# Patient Record
Sex: Male | Born: 1955 | Race: White | Hispanic: No | Marital: Married | Smoking: Former smoker
Health system: Southern US, Community
[De-identification: ages and names within clinical notes are randomized; demographics above are authoritative.]

## PROBLEM LIST (undated history)

## (undated) DIAGNOSIS — Z9889 Other specified postprocedural states: Secondary | ICD-10-CM

## (undated) DIAGNOSIS — R112 Nausea with vomiting, unspecified: Secondary | ICD-10-CM

## (undated) DIAGNOSIS — F17200 Nicotine dependence, unspecified, uncomplicated: Secondary | ICD-10-CM

## (undated) DIAGNOSIS — K219 Gastro-esophageal reflux disease without esophagitis: Secondary | ICD-10-CM

## (undated) DIAGNOSIS — K222 Esophageal obstruction: Secondary | ICD-10-CM

## (undated) DIAGNOSIS — T8859XA Other complications of anesthesia, initial encounter: Secondary | ICD-10-CM

## (undated) DIAGNOSIS — K279 Peptic ulcer, site unspecified, unspecified as acute or chronic, without hemorrhage or perforation: Secondary | ICD-10-CM

## (undated) DIAGNOSIS — I4891 Unspecified atrial fibrillation: Secondary | ICD-10-CM

## (undated) DIAGNOSIS — M2170 Unequal limb length (acquired), unspecified site: Secondary | ICD-10-CM

## (undated) DIAGNOSIS — E785 Hyperlipidemia, unspecified: Secondary | ICD-10-CM

## (undated) DIAGNOSIS — J342 Deviated nasal septum: Secondary | ICD-10-CM

## (undated) HISTORY — DX: Deviated nasal septum: J34.2

## (undated) HISTORY — PX: KNEE ARTHROSCOPY: SUR90

## (undated) HISTORY — PX: POLYPECTOMY: SHX149

## (undated) HISTORY — DX: Esophageal obstruction: K22.2

## (undated) HISTORY — DX: Gastro-esophageal reflux disease without esophagitis: K21.9

## (undated) HISTORY — DX: Peptic ulcer, site unspecified, unspecified as acute or chronic, without hemorrhage or perforation: K27.9

## (undated) HISTORY — DX: Unequal limb length (acquired), unspecified site: M21.70

## (undated) HISTORY — DX: Hyperlipidemia, unspecified: E78.5

## (undated) HISTORY — DX: Unspecified atrial fibrillation: I48.91

## (undated) HISTORY — PX: EYE SURGERY: SHX253

## (undated) HISTORY — DX: Nicotine dependence, unspecified, uncomplicated: F17.200

---

## 2000-08-01 ENCOUNTER — Encounter: Admission: RE | Admit: 2000-08-01 | Discharge: 2000-08-01 | Payer: Self-pay | Admitting: Family Medicine

## 2000-08-01 ENCOUNTER — Encounter: Payer: Self-pay | Admitting: Family Medicine

## 2000-12-11 ENCOUNTER — Ambulatory Visit (HOSPITAL_COMMUNITY): Admission: RE | Admit: 2000-12-11 | Discharge: 2000-12-11 | Payer: Self-pay | Admitting: Gastroenterology

## 2000-12-11 ENCOUNTER — Encounter: Payer: Self-pay | Admitting: Gastroenterology

## 2000-12-31 ENCOUNTER — Ambulatory Visit (HOSPITAL_COMMUNITY): Admission: RE | Admit: 2000-12-31 | Discharge: 2000-12-31 | Payer: Self-pay | Admitting: Gastroenterology

## 2000-12-31 ENCOUNTER — Encounter: Payer: Self-pay | Admitting: Gastroenterology

## 2001-02-25 ENCOUNTER — Ambulatory Visit (HOSPITAL_COMMUNITY): Admission: RE | Admit: 2001-02-25 | Discharge: 2001-02-25 | Payer: Self-pay | Admitting: Gastroenterology

## 2005-11-04 HISTORY — PX: COLONOSCOPY: SHX174

## 2006-06-05 ENCOUNTER — Ambulatory Visit: Payer: Self-pay | Admitting: Family Medicine

## 2006-07-01 ENCOUNTER — Ambulatory Visit: Payer: Self-pay | Admitting: Internal Medicine

## 2006-07-17 ENCOUNTER — Ambulatory Visit: Payer: Self-pay | Admitting: Family Medicine

## 2006-07-29 ENCOUNTER — Ambulatory Visit: Payer: Self-pay | Admitting: Internal Medicine

## 2006-07-29 LAB — HM COLONOSCOPY: HM Colonoscopy: NONE SEEN

## 2008-01-21 ENCOUNTER — Ambulatory Visit: Payer: Self-pay | Admitting: Family Medicine

## 2008-03-14 ENCOUNTER — Ambulatory Visit: Payer: Self-pay | Admitting: Family Medicine

## 2009-05-25 ENCOUNTER — Ambulatory Visit: Payer: Self-pay | Admitting: Family Medicine

## 2009-11-08 ENCOUNTER — Ambulatory Visit: Payer: Self-pay | Admitting: Family Medicine

## 2010-09-10 ENCOUNTER — Ambulatory Visit: Payer: Self-pay | Admitting: Family Medicine

## 2010-10-03 ENCOUNTER — Ambulatory Visit: Payer: Self-pay | Admitting: Family Medicine

## 2010-10-30 ENCOUNTER — Ambulatory Visit
Admission: RE | Admit: 2010-10-30 | Discharge: 2010-10-30 | Payer: Self-pay | Source: Home / Self Care | Attending: Internal Medicine | Admitting: Internal Medicine

## 2010-10-30 ENCOUNTER — Telehealth: Payer: Self-pay | Admitting: Internal Medicine

## 2010-10-30 DIAGNOSIS — K2100 Gastro-esophageal reflux disease with esophagitis, without bleeding: Secondary | ICD-10-CM | POA: Insufficient documentation

## 2010-10-30 DIAGNOSIS — K219 Gastro-esophageal reflux disease without esophagitis: Secondary | ICD-10-CM | POA: Insufficient documentation

## 2010-10-30 DIAGNOSIS — R1033 Periumbilical pain: Secondary | ICD-10-CM | POA: Insufficient documentation

## 2010-10-30 DIAGNOSIS — R112 Nausea with vomiting, unspecified: Secondary | ICD-10-CM | POA: Insufficient documentation

## 2010-10-31 ENCOUNTER — Encounter: Payer: Self-pay | Admitting: Internal Medicine

## 2010-10-31 ENCOUNTER — Ambulatory Visit: Payer: Self-pay | Admitting: Cardiology

## 2010-11-06 LAB — CONVERTED CEMR LAB
ALT: 20 units/L (ref 0–53)
AST: 16 units/L (ref 0–37)
Albumin: 3.9 g/dL (ref 3.5–5.2)
Alkaline Phosphatase: 78 units/L (ref 39–117)
BUN: 19 mg/dL (ref 6–23)
Basophils Absolute: 0.1 10*3/uL (ref 0.0–0.1)
Basophils Relative: 0.8 % (ref 0.0–3.0)
CO2: 29 meq/L (ref 19–32)
CRP, High Sensitivity: 7.38 — ABNORMAL HIGH (ref 0.00–5.00)
Calcium: 9.4 mg/dL (ref 8.4–10.5)
Chloride: 105 meq/L (ref 96–112)
Creatinine, Ser: 1.1 mg/dL (ref 0.4–1.5)
Eosinophils Absolute: 0.2 10*3/uL (ref 0.0–0.7)
Eosinophils Relative: 1.7 % (ref 0.0–5.0)
GFR calc non Af Amer: 75.61 mL/min (ref >60.00)
Glucose, Bld: 78 mg/dL (ref 70–99)
HCT: 40.3 % (ref 39.0–52.0)
Hemoglobin: 14.1 g/dL (ref 13.0–17.0)
Lipase: 30 units/L (ref 11.0–59.0)
Lymphocytes Relative: 29.9 % (ref 12.0–46.0)
Lymphs Abs: 2.8 10*3/uL (ref 0.7–4.0)
MCHC: 34.8 g/dL (ref 30.0–36.0)
MCV: 95.8 fL (ref 78.0–100.0)
Monocytes Absolute: 0.6 10*3/uL (ref 0.1–1.0)
Monocytes Relative: 5.8 % (ref 3.0–12.0)
Neutro Abs: 5.8 10*3/uL (ref 1.4–7.7)
Neutrophils Relative %: 61.8 % (ref 43.0–77.0)
Platelets: 325 10*3/uL (ref 150.0–400.0)
Potassium: 4.1 meq/L (ref 3.5–5.1)
RBC: 4.21 M/uL — ABNORMAL LOW (ref 4.22–5.81)
RDW: 13.9 % (ref 11.5–14.6)
Sodium: 140 meq/L (ref 135–145)
Total Bilirubin: 0.3 mg/dL (ref 0.3–1.2)
Total Protein: 6.8 g/dL (ref 6.0–8.3)
WBC: 9.4 10*3/uL (ref 4.5–10.5)

## 2010-11-08 ENCOUNTER — Ambulatory Visit
Admission: RE | Admit: 2010-11-08 | Discharge: 2010-11-08 | Payer: Self-pay | Source: Home / Self Care | Attending: Internal Medicine | Admitting: Internal Medicine

## 2010-11-08 ENCOUNTER — Encounter: Payer: Self-pay | Admitting: Internal Medicine

## 2010-12-06 NOTE — Procedures (Addendum)
Summary: [facility] COLON   Colonoscopy  Procedure date:  07/29/2006  Findings:      Location:  [facility].    Procedures Next Due Date:    Colonoscopy: 08/2016 Patient Name: Gerald [name], Gerald [name] MRN:  Procedure Procedures: Colonoscopy CPT: 40981.  Personnel: Endoscopist: Wilhemina Bonito. Marina Goodell, MD.  Referred By: Everardo All Susann Givens, MD.  Exam Location: Exam performed in Outpatient Clinic. Outpatient  Patient Consent: Procedure, Alternatives, Risks and Benefits discussed, consent obtained, from patient. Consent was obtained by the RN.  Indications  Increased Risk Screening: Family History of Polyps.  History  Current Medications: Patient is not currently taking Coumadin.  Pre-Exam Physical: Performed Jul 29, 2006. Cardio-pulmonary exam, Rectal exam, HEENT exam , Abdominal exam, Mental status exam WNL.  Comments: Pt. history reviewed/updated, physical exam performed prior to initiation of sedation?YES Exam Exam: Extent of exam reached: Cecum, extent intended: Cecum.  The cecum was identified by appendiceal orifice and IC valve. Patient position: on left side. Time to Cecum: 00:02:50. Time for Withdrawl: 00:18:13. Colon retroflexion performed. Images taken. ASA Classification: I. Tolerance: excellent.  Monitoring: Pulse and BP monitoring, Oximetry used. Supplemental O2 given.  Colon Prep Used MIRALAX for colon prep. Prep results: good.  Sedation Meds: Patient assessed and found to be appropriate for moderate (conscious) sedation. Fentanyl 100 mcg. given IV. Versed 10 mg. given IV.  Findings NORMAL EXAM: Cecum to Rectum.   Assessment Normal examination.  Comments: NO POLYPS SEEN Events  Unplanned Interventions: No intervention was required.  Unplanned Events: There were no complications. Plans Disposition: After procedure patient sent to recovery. After recovery patient sent home.  Scheduling/Referral: Colonoscopy, to Wilhemina Bonito. Marina Goodell, MD, IN 10 YEARS FOR  RPEAT SCREENING (sooner if clinically indicated),   Comments: RETURN TO THE CARE OF DR Susann Givens  cc: Everardo All. Susann Givens, MD  This report was created from the original endoscopy report, which was reviewed and signed by the above listed endoscopist.

## 2010-12-06 NOTE — Assessment & Plan Note (Addendum)
Summary: ABDOMINAL DISCOMFORT (work in appt)   History of Present Illness Visit Type: Initial Visit Primary GI MD: Yancey Flemings MD Primary Provider: Sharlot Gowda, MD Chief Complaint: epigastric pain, nausea, vomiting x 1 month History of Present Illness:   54 YO MALE KNOWN TO DR. PERRY FROM PRIOR COLONOSCOPY IN 2007. THIS WAS A NORMAL EXAM.  HE COMES IN TODAY AS AN ADD ON WITH NEW C/O EPIGASTRIC /UPPER ABDOMINAL PAIN,NAUSEA AND VOMITING. HE SAYS HIS SXS STARTED IN EARLY DECEMBER WHILE HE WAS ON A GOLF TRIP. HE WOKE UP NAUSEATED,WITH A HEAVY FEELING IN HIS ABDOMEN AND LATER VOMITED WHILE ON THE GILF COURSE. HE HAS BEEN WAKING UP FREQUENTLY NAUSEATED SINCE THEN , AND HAS HAD INTERMITTENT UPPER ABDOMINAL PAIN WHICH HE DESCRIBES AS A" BOWLING BALL" FEELING . APPETITE OK, WEIGHT STABLE, NO CHANGE IN BOWEL HABITS, NO HEME OR MELENA. NO FEVERS. TODAY HE WOKE UP NAUSEATED ,HAD THE HEAVY PRESSURE FEELING IN HIS ABDOMEN, AND VOMITED. HE HAS SINCE BEEN ABLE TP KEEP DOWN SOME by mouth'S BUT FEELS QUEASY.  HE STAYS ON A PPI FOR GERD, CURRENTLY ON NEXIUM 40 DAILY. TAKES OCCASIONAL IBUPROFEN /ALEVE THOUGH NOT DAILY. HE RELATES HE HAD A PHYSICAL LAST MONTH WITH DR. Susann Givens. WE OBTAINED COPIES OF THOSE LABS ANS CBC,CMET UNREMARKABLE.   GI Review of Systems    Reports abdominal pain, acid reflux, bloating, nausea, and  vomiting.     Location of  Abdominal pain: epigastric area.    Denies belching, chest pain, dysphagia with liquids, dysphagia with solids, heartburn, loss of appetite, vomiting blood, weight loss, and  weight gain.      Reports light color stool.     Denies anal fissure, black tarry stools, change in bowel habit, constipation, diarrhea, diverticulosis, fecal incontinence, heme positive stool, hemorrhoids, irritable bowel syndrome, jaundice, liver problems, rectal bleeding, and  rectal pain. Preventive Screening-Counseling & Management  Alcohol-Tobacco     Smoking Status: current      Drug Use:   no.      Current Medications (verified): 1)  Nexium 40 Mg Cpdr (Esomeprazole Magnesium) .... Take 1 Capsule By Mouth Once Daily 2)  Prilosec Otc 20 Mg Tbec (Omeprazole Magnesium) .... As Needed  Allergies (verified): No Known Drug Allergies  Past History:  Past Medical History: duodenal ulcer 1980 gerd  Past Surgical History: Knee Arthroscopy 2000 colonoscopy 2007-normal  Family History: Family History of Colon Polyps:Mother No FH of Colon Cancer:  Social History: Occupation: Agricultural consultant Patient currently smokes.  Alcohol Use - yes Daily Caffeine Use Illicit Drug Use - no Smoking Status:  current Drug Use:  no  Review of Systems  The patient denies allergy/sinus, anemia, anxiety-new, arthritis/joint pain, back pain, blood in urine, breast changes/lumps, change in vision, confusion, cough, coughing up blood, depression-new, fainting, fatigue, fever, headaches-new, hearing problems, heart murmur, heart rhythm changes, itching, menstrual pain, muscle pains/cramps, night sweats, nosebleeds, pregnancy symptoms, shortness of breath, skin rash, sleeping problems, sore throat, swelling of feet/legs, swollen lymph glands, thirst - excessive , urination - excessive , urination changes/pain, urine leakage, vision changes, and voice change.         SEE HPI  Vital Signs:  Patient profile:   55 year old male Height:      72 inches Weight:      181.25 pounds BMI:     24.67 Pulse rate:   64 / minute Pulse rhythm:   regular BP sitting:   114 / 68  (left arm) Cuff size:  regular  Vitals Entered By: June McMurray CMA Duncan Dull) (October 30, 2010 2:25 PM)  Physical Exam  General:  Well developed, well nourished, no acute distress. Head:  Normocephalic and atraumatic. Eyes:  PERRLA, no icterus. Lungs:  Clear throughout to auscultation. Heart:  Regular rate and rhythm; no murmurs, rubs,  or bruits. Abdomen:  SOFT, TENDER HYPOGASTRIUM/PERIUMBILICAL REGION, NO GUARDING, NO  REBOUND, NO PALP MASS OR HSM,BS+ Rectal:  NOT DONE Extremities:  No clubbing, cyanosis, edema or deformities noted. Neurologic:  Alert and  oriented x4;  grossly normal neurologically. Psych:  Alert and cooperative. Normal mood and affect.   Impression & Recommendations:  Problem # 1:  NAUSEA WITH VOMITING (ICD-787.01) Assessment New 54 YO MALE GENERLAYY HEALTHY WITH HX GERD, NOW WITH ONE MONTH HX OF INTERMITTENT NAUSEA, VOMITING,UPPER ABDOMINAL DISCOMFORT,HEAVINESS, BLOATING. EPISODE OF N/V AND DISCOMFORT TODAY. R/O GB DISEASE,R/O PUD,PANCREATIC DISEASE.  LABS AS BELOW SCHEDULE FOR CT ABDOMEN /PELVIS TODAY OR TOMORROW TAKE NEXIUM 40 MG TWICE DAILY FOR NOW PHENERGAN 25 MG  Q 6 HOURS AS NEEDED FOR NAUSEA IF CT AND LABS UNREVEALING WILL LIKELY NEED EGD WITH DR. PERRY.  Orders: TLB-CMP (Comprehensive Metabolic Pnl) (80053-COMP) TLB-CRP-High Sensitivity (C-Reactive Protein) (86140-FCRP) TLB-CBC Platelet - w/Differential (85025-CBCD) TLB-Lipase (83690-LIPASE) CT Abdomen/Pelvis with Contrast (CT Abd/Pelvis w/con)  Problem # 2:  GERD (ICD-530.81) Assessment: Comment Only  Patient Instructions: 1)  Please go to lab, basement level. 2)  We sent a perscription for Promethazine for the nausea to [facility] [address] 3)  We scheduled the CT scan at our [place] office [address] in our [place] building across from [facility].  4)  Copy sent to : Dr. Sharlot Gowda 5)  The medication list was reviewed and reconciled.  All changed / newly prescribed medications were explained.  A complete medication list was provided to the patient / caregiver. Prescriptions: PROMETHAZINE HCL 25 MG TABS (PROMETHAZINE HCL) Take 1 tab every 6 hours as needed for nausea  #20 x 0   Entered by:   Lowry Ram NCMA   Authorized by:   Sammuel Cooper PA-c   Signed by:   Lowry Ram NCMA on 10/30/2010   Method used:   [facility]ically to        [phone]* (retail)        [address] [place], [place]  [address]       Ph: [phone] or [phone]       Fax: [phone]   RxID:   [id number]

## 2010-12-06 NOTE — Progress Notes (Addendum)
Summary: Triage  Phone Note Call from Patient Call back at [phone]   Caller: Patient Call For: Dr. Marina Goodell Reason for Call: Talk to Nurse Summary of Call: Pts wife is calling bacause pt has been having abdominal discomfort and occasional vomiting, scheduled in feb but wife wants earlier appt because she thinks husband is seriously sick Initial call taken by: [place] Johnson,  October 30, 2010 8:25 AM  Follow-up for Phone Call        Pt. has had 4 episodes of nausea, vomiting and abd. pain since the first week in Dec. with current symptoms now.Given appt. with PA for this afternoon as DrPerry is supervising dr. Follow-up by: Teryl Lucy RN,  October 30, 2010 9:25 AM

## 2010-12-06 NOTE — Procedures (Addendum)
Summary: Upper Endoscopy  Patient: [name] Note: All result statuses are Final unless otherwise noted.  Tests: (1) Upper Endoscopy (EGD)   EGD Upper Endoscopy       DONE     [facility]     [address]     [place], [place]  [address]           ENDOSCOPY PROCEDURE REPORT           PATIENT:  Gerald [name], Gerald [name]  MR#:  [id number]     BIRTHDATE:  Jul 23, 1956, 54 yrs. old  GENDER:  male           ENDOSCOPIST:  Wilhemina Bonito. Eda Keys, MD     Referred by:  Office           PROCEDURE DATE:  11/08/2010     PROCEDURE:  EGD, diagnostic 19147     ASA CLASS:  Class I     INDICATIONS:  nausea, bloating, abdominal discomfort           MEDICATIONS:   Fentanyl 100 mcg IV, Versed 10 mg IV     TOPICAL ANESTHETIC:  Exactacain Spray           DESCRIPTION OF PROCEDURE:   After the risks benefits and     alternatives of the procedure were thoroughly explained, informed     consent was obtained.  The LB GIF-H180 [id number] endoscope was     introduced through the mouth and advanced to the fourth portion of     the duodenum, without limitations.  The instrument was slowly     withdrawn as the mucosa was fully examined.     <<PROCEDUREIMAGES>>           The upper, middle, and distal third of the esophagus were     carefully inspected and no abnormalities were noted. The z-line     was well seen at the GEJ. The endoscope was pushed into the fundus     which was normal including a retroflexed view. The antrum,gastric     body, first, second, third, and fourth part of the duodenum were     unremarkable.    Retroflexed views revealed no abnormalities.     The scope was then withdrawn from the patient and the procedure     completed.           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 Eda Keys, MD           CC:  Sharlot Gowda, MD, The Patient           n.     eSIGNED:   Wilhemina Bonito. Eda Keys at 11/08/2010 03:57 PM           Rita Ohara, [phone]  Note: An exclamation mark (!) indicates a result that was not dispersed into the flowsheet. Document Creation Date: 11/08/2010 3:58 PM _______________________________________________________________________  (1) Order result status: Final Collection or observation date-time: 11/08/2010 15:49 Requested date-time:  Receipt date-time:  Reported date-time:  Referring Physician:   Ordering Physician:  Fransico Setters [phone]) Specimen Source:  Source: Launa Grill Order Number: [id number] Lab site:

## 2010-12-06 NOTE — Miscellaneous (Addendum)
Summary: [facility] EGD  Clinical Lists Changes  Orders: Added new Test order of EGD (EGD) - Signed

## 2010-12-06 NOTE — Letter (Addendum)
Summary: EGD Instructions  [facility]  [address] [place], [place] [address]   Phone: [phone]  Fax: [phone]       Gerald [name]    03-Nov-1956    MRN: [id number]       Procedure Day /Date: 11-08-2010     Arrival Time: 3;00 PM      Procedure Time: 4:00 PM     Location of Procedure:                    X    [facility] (4th Floor) PREPARATION FOR ENDOSCOPY   On 11-08-2010 THE DAY OF THE PROCEDURE:  1.   No solid foods, milk or milk products are allowed after midnight the night before your procedure.  2.   Do not drink anything colored red or purple.  Avoid juices with pulp.  No orange juice.  3.  You may drink clear liquids unti l2:00 PM , which is 2 hours before your procedure.                                                                                                CLEAR LIQUIDS INCLUDE: Water Jello Ice Popsicles Tea (sugar ok, no milk/cream) Powdered fruit flavored drinks Coffee (sugar ok, no milk/cream) Gatorade Juice: apple, white grape, white cranberry  Lemonade Clear bullion, consomm, broth Carbonated beverages (any kind) Strained chicken noodle soup Hard Candy    MEDICATION INSTRUCTIONS  Unless otherwise instructed, you should take regular prescription medications with a small sip of water as early as possible the morning of your procedure.         OTHER INSTRUCTIONS  You will need a responsible adult at least 55 years of age to accompany you and drive you home.   This person must remain in the waiting room during your procedure.  Wear loose fitting clothing that is easily removed.  Leave jewelry and other valuables at home.  However, you may wish to bring a book to read or an iPod/MP3 player to listen to music as you wait for your procedure to start.  Remove all body piercing jewelry and leave at home.  Total time from sign-in until discharge is approximately 2-3 hours.  You should go home directly after  your procedure and rest.  You can resume normal activities the day after your procedure.  The day of your procedure you should not:   Drive   Make legal decisions   Operate machinery   Drink alcohol   Return to work  You will receive specific instructions about eating, activities and medications before you leave.    The above instructions have been reviewed and explained to me by   _______________________    I fully understand and can verbalize these instructions _____________________________ Date _________

## 2010-12-13 ENCOUNTER — Ambulatory Visit: Payer: Self-pay | Admitting: Internal Medicine

## 2011-01-15 ENCOUNTER — Encounter (INDEPENDENT_AMBULATORY_CARE_PROVIDER_SITE_OTHER): Payer: Self-pay | Admitting: *Deleted

## 2011-01-22 NOTE — Letter (Signed)
Summary: New Patient letter  [facility]  [address] [place], [place] [address]   Phone: [phone]  Fax: [phone]       01/15/2011 MRN: [id number]  Gerald [name] [address] [place], [place]  [address]  Dear Gerald [name],  Welcome to the Gastroenterology Division at [facility].    You are scheduled to see Dr.  Marina Goodell on 02-25-11 at 11:00A.M. on the 3rd floor at [facility], [address]  We ask that you try to arrive at our office 15 minutes prior to your appointment time to allow for check-in.  We would like you to complete the enclosed self-administered evaluation form prior to your visit and bring it with you on the day of your appointment.  We will review it with you.  Also, please bring a complete list of all your medications or, if you prefer, bring the medication bottles and we will list them.  Please bring your insurance card so that we may make a copy of it.  If your insurance requires a referral to see a specialist, please bring your referral form from your primary care physician.  Co-payments are due at the time of your visit and may be paid by cash, check or credit card.     Your office visit will consist of a consult with your physician (includes a physical exam), any laboratory testing he/she may order, scheduling of any necessary diagnostic testing (e.g. x-ray, ultrasound, CT-scan), and scheduling of a procedure (e.g. Endoscopy, Colonoscopy) if required.  Please allow enough time on your schedule to allow for any/all of these possibilities.    If you cannot keep your appointment, please call [phone] to cancel or reschedule prior to your appointment date.  This allows [place] the opportunity to schedule an appointment for another patient in need of care.  If you do not cancel or reschedule by 5 p.m. the business day prior to your appointment date, you will be charged a $50.00 late cancellation/no-show fee.    Thank you for choosing  [facility] for your medical needs.  We appreciate the opportunity to care for you.  Please visit [place] at our website  to learn more about our practice.                     Sincerely,                                                             The Gastroenterology Division

## 2011-02-25 ENCOUNTER — Ambulatory Visit (INDEPENDENT_AMBULATORY_CARE_PROVIDER_SITE_OTHER): Payer: BC Managed Care – PPO | Admitting: Internal Medicine

## 2011-02-25 ENCOUNTER — Encounter: Payer: Self-pay | Admitting: Internal Medicine

## 2011-02-25 VITALS — BP 134/82 | HR 76 | Ht 72.0 in | Wt 183.0 lb

## 2011-02-25 DIAGNOSIS — R143 Flatulence: Secondary | ICD-10-CM

## 2011-02-25 DIAGNOSIS — K5289 Other specified noninfective gastroenteritis and colitis: Secondary | ICD-10-CM

## 2011-02-25 DIAGNOSIS — R141 Gas pain: Secondary | ICD-10-CM

## 2011-02-25 DIAGNOSIS — K219 Gastro-esophageal reflux disease without esophagitis: Secondary | ICD-10-CM

## 2011-02-25 MED ORDER — ESOMEPRAZOLE MAGNESIUM 40 MG PO CPDR: 40.0000 mg | DELAYED_RELEASE_CAPSULE | Freq: Every day | ORAL | Status: DC

## 2011-02-25 MED ORDER — ALIGN 4 MG PO CAPS: 1.0000 | ORAL_CAPSULE | Freq: Every day | ORAL | Status: DC

## 2011-02-25 NOTE — Progress Notes (Signed)
HISTORY OF PRESENT ILLNESS:  Gerald [name] is a 55 y.o. male with GERD and negative screening colonoscopy who presents today for followup. He is accompanied by his wife. He was evaluated in December for problems with abdominal discomfort, chronic nausea, and intermittent vomiting. Blood work was unremarkable. CT scan of the abdomen and pelvis revealed subtle mild proximal jejunal dilation. Upper endoscopy in January was unremarkable. He was empirically placed on PPI. 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 Overall he has done well. Nausea and vomiting have resolved. He describes some intermittent mid abdominal burning discomfort that lasts about 30 minutes. Also increased intestinal gas as manifested by flatus. Weight has been stable. He thinks maybe he does a bit better on Nexium though is uncertain as he takes this somewhat sporadically.  REVIEW OF SYSTEMS:  All non-GI ROS negative.  Past Medical History  Diagnosis Date  . Duodenal ulcer   . GERD (gastroesophageal reflux disease)     Past Surgical History  Procedure Date  . Knee arthroscopy     Social History Gerald [name]  reports that he has been smoking.  He does not have any smokeless tobacco history on file. He reports that he drinks alcohol. He reports that he does not use illicit drugs.  family history includes Colon polyps in his mother.  There is no history of Colon cancer.  No Known Allergies     PHYSICAL EXAMINATION:  Vital signs: BP 134/82  Pulse 76  Ht 6' (1.829 m)  Wt 183 lb (83.008 kg)  BMI 24.82 kg/m2 General: Well-developed, well-nourished, no acute distress HEENT: Sclerae are anicteric, conjunctiva pink. Oral mucosa intact Lungs: Clear Heart: Regular Abdomen: soft, nontender, nondistended, no obvious ascites, no peritoneal signs, normal bowel sounds. No organomegaly. Extremities: No edema Psychiatric: alert and oriented x3. Cooperative   ASSESSMENT:  #1. Postinfectious dysmotility.  Improving #2. Possible GERD #3. Negative screening colonoscopy 2007 #4. Increased intestinal gas   PLAN:  #1. Continue PPI more regularly to see if this helps with burning discomfort #2. Probiotic align one daily for 2-3 weeks. Samples given. This is an effort to improve problems with gas #3. GI followup when necessary

## 2011-02-25 NOTE — Patient Instructions (Signed)
Rx. For Nexium 40 mg sent to your pharmacy for you to pick up.  1 year supply. Align samples given for you to take 1 daily x 3 weeks. Please call and follow-up with Dr. Marina Goodell as needed.

## 2011-03-22 NOTE — Op Note (Signed)
[place]  Patient:    Gerald [name], Gerald [name]                   MRN: [id number] Proc. Date: 02/25/01 Attending:  Anselmo Rod, M.D. CC:         Ronnald Nian, M.D.   Operative Report  DATE OF BIRTH:  1956-06-14.  REFERRING PHYSICIAN:  Ronnald Nian, M.D.  PROCEDURE PERFORMED:  Esophagogastroduodenoscopy.  ENDOSCOPIST:  Anselmo Rod, M.D.  INSTRUMENT USED:  Olympus video panendoscope.  INDICATIONS FOR PROCEDURE:  Epigastric pain with severe reflux in a 55 year old white male rule out peptic ulcer disease, esophagitis, gastritis, etc.  PREPROCEDURE PREPARATION:  Informed consent was procured from the patient. The patient was fasted for eight hours prior to the procedure.  PREPROCEDURE PHYSICAL:  The patient had stable vital signs.  Neck supple. Chest clear to auscultation.  S1, S2 regular.  Abdomen soft with epigastric tenderness on palpation with no guarding, no rebound, no masses palpable.  DESCRIPTION OF PROCEDURE:  The patient was placed in left lateral decubitus and no sedation was used.  The patient opted to do the procedure without sedation.  Once the patient was adequately positioned and made comfortable on his left side, the Olympus video panendoscope was advanced through the mouthpiece, over the tongue, into the esophagus under direct vision.  The entire esophagus appeared normal without evidence of ring, stricture, masses, lesions or esophagitis or Barretts mucosa.  The scope was then advanced to the stomach.  There was diffuse gastritis of the gastric mucosa with more prominent changes in the antrum.  A small hiatal hernia was seen on high retroflexion.  No ulcers, erosions, masses or polyps were present in the duodenal bulb and the proximal small bowel up to 70 cm appeared normal.  IMPRESSION: 1. Moderate diffuse gastritis with more prominent changes in the antrum. 2. Small hiatal hernia. 3. Normal-appearing  esophagus and proximal small bowel.  RECOMMENDATION: 1. Patient has been advised to continue Prevacid 30 mg 1 p.o. an hour before    breakfast. 2. He has strongly been advised against the use of all nonsteroidals.  He    tends to use a lot of Aleve during the basketball season.  Tylenol is to    be used as needed. 3. The patient has been advised to increase the fluid and fiber in the diet. 4. Outpatient follow-up in the next two weeks. DD:  02/25/01 TD:  02/25/01 Job: [id number]

## 2011-06-23 ENCOUNTER — Other Ambulatory Visit: Payer: Self-pay | Admitting: Family Medicine

## 2011-06-23 DIAGNOSIS — J019 Acute sinusitis, unspecified: Secondary | ICD-10-CM

## 2011-06-23 MED ORDER — CLINDAMYCIN HCL 300 MG PO CAPS: 300.0000 mg | ORAL_CAPSULE | Freq: Three times a day (TID) | ORAL | Status: DC

## 2011-06-23 MED ORDER — DOXYCYCLINE HYCLATE 100 MG PO TABS: 100.0000 mg | ORAL_TABLET | Freq: Two times a day (BID) | ORAL | Status: DC

## 2011-06-23 NOTE — Progress Notes (Signed)
He has had difficulty since February with left-sided nasal symptoms. He is seen by ENT on 2 occasions and given an antibiotic twice. He was also given Bactroban nasal ointment. He has been off antibiotics for at least 2 months. 2 days ago he developed left-sided sinus pressure, pain, lymph node swelling, slight sore throat and malaise

## 2011-06-24 ENCOUNTER — Ambulatory Visit (INDEPENDENT_AMBULATORY_CARE_PROVIDER_SITE_OTHER): Payer: BC Managed Care – PPO | Admitting: Family Medicine

## 2011-06-24 ENCOUNTER — Encounter: Payer: Self-pay | Admitting: Family Medicine

## 2011-06-24 DIAGNOSIS — J340 Abscess, furuncle and carbuncle of nose: Secondary | ICD-10-CM

## 2011-06-24 DIAGNOSIS — J3489 Other specified disorders of nose and nasal sinuses: Secondary | ICD-10-CM

## 2011-06-24 DIAGNOSIS — J019 Acute sinusitis, unspecified: Secondary | ICD-10-CM

## 2011-06-24 NOTE — Progress Notes (Signed)
  Subjective:    Patient ID: Gerald [name], male    DOB: Nov 15, 1955, 55 y.o.   MRN: [id number]  HPI He was seen yesterday for evaluation of a history of having a left-sided nasal lesions and drainage. He was seen by ENT and treated on 2 occasions with antibiotics as well as given Bactroban. In the last couple of days he has noted increase left facial and maxillary sinus pain, pressure, sore throat, malaise, swollen lymph node.   Review of Systems     Objective:   Physical Exam alert and in no distress. Tympanic membranes and canals are normal. Throat is clear. Tonsils are normal. Neck is supple without adenopathy or thyromegaly. Cardiac exam shows a regular sinus rhythm without murmurs or gallops. Lungs are clear to auscultation. Left nares does show recent bleeding and irritation. He is tender to palpation over the maxillary sinuses especially left. Submandibular adenopathy is noted on the left.        Assessment & Plan:  Sinusitis with probable nasal infection. I will place him on clindamycin as well as doxycycline. He is to followup with me in approximately 2 weeks

## 2011-07-10 ENCOUNTER — Telehealth: Payer: Self-pay | Admitting: Family Medicine

## 2011-07-15 NOTE — Telephone Encounter (Signed)
SEEN BY [employer]

## 2011-10-04 ENCOUNTER — Encounter: Payer: Self-pay | Admitting: Family Medicine

## 2011-10-07 ENCOUNTER — Encounter: Payer: Self-pay | Admitting: Family Medicine

## 2011-10-07 ENCOUNTER — Ambulatory Visit (INDEPENDENT_AMBULATORY_CARE_PROVIDER_SITE_OTHER): Payer: BC Managed Care – PPO | Admitting: Family Medicine

## 2011-10-07 VITALS — BP 110/74 | HR 75 | Ht 72.0 in | Wt 183.0 lb

## 2011-10-07 DIAGNOSIS — Z0000 Encounter for general adult medical examination without abnormal findings: Secondary | ICD-10-CM

## 2011-10-07 DIAGNOSIS — M79609 Pain in unspecified limb: Secondary | ICD-10-CM

## 2011-10-07 DIAGNOSIS — K219 Gastro-esophageal reflux disease without esophagitis: Secondary | ICD-10-CM

## 2011-10-07 DIAGNOSIS — M461 Sacroiliitis, not elsewhere classified: Secondary | ICD-10-CM

## 2011-10-07 DIAGNOSIS — J019 Acute sinusitis, unspecified: Secondary | ICD-10-CM

## 2011-10-07 DIAGNOSIS — M79673 Pain in unspecified foot: Secondary | ICD-10-CM

## 2011-10-07 LAB — COMPREHENSIVE METABOLIC PANEL
ALT: 21 U/L (ref 0–53)
AST: 21 U/L (ref 0–37)
Albumin: 4.8 g/dL (ref 3.5–5.2)
Alkaline Phosphatase: 90 U/L (ref 39–117)
BUN: 16 mg/dL (ref 6–23)
CO2: 28 mEq/L (ref 19–32)
Calcium: 9.4 mg/dL (ref 8.4–10.5)
Chloride: 104 mEq/L (ref 96–112)
Creat: 0.95 mg/dL (ref 0.50–1.35)
Glucose, Bld: 90 mg/dL (ref 70–99)
Potassium: 4.9 mEq/L (ref 3.5–5.3)
Sodium: 140 mEq/L (ref 135–145)
Total Bilirubin: 0.4 mg/dL (ref 0.3–1.2)
Total Protein: 7.2 g/dL (ref 6.0–8.3)

## 2011-10-07 LAB — LIPID PANEL
Cholesterol: 235 mg/dL — ABNORMAL HIGH (ref 0–200)
HDL: 26 mg/dL — ABNORMAL LOW (ref >39)
Total CHOL/HDL Ratio: 9 Ratio
Triglycerides: 660 mg/dL — ABNORMAL HIGH (ref <150)

## 2011-10-07 LAB — CBC WITH DIFFERENTIAL/PLATELET
Basophils Absolute: 0 10*3/uL (ref 0.0–0.1)
Basophils Relative: 1 % (ref 0–1)
Eosinophils Absolute: 0.1 10*3/uL (ref 0.0–0.7)
Eosinophils Relative: 2 % (ref 0–5)
HCT: 42.4 % (ref 39.0–52.0)
Hemoglobin: 14.2 g/dL (ref 13.0–17.0)
Lymphocytes Relative: 42 % (ref 12–46)
Lymphs Abs: 2.7 10*3/uL (ref 0.7–4.0)
MCH: 32.5 pg (ref 26.0–34.0)
MCHC: 33.5 g/dL (ref 30.0–36.0)
MCV: 97 fL (ref 78.0–100.0)
Monocytes Absolute: 0.5 10*3/uL (ref 0.1–1.0)
Monocytes Relative: 7 % (ref 3–12)
Neutro Abs: 3.2 10*3/uL (ref 1.7–7.7)
Neutrophils Relative %: 49 % (ref 43–77)
Platelets: 325 10*3/uL (ref 150–400)
RBC: 4.37 MIL/uL (ref 4.22–5.81)
RDW: 14.4 % (ref 11.5–15.5)
WBC: 6.5 10*3/uL (ref 4.0–10.5)

## 2011-10-07 LAB — HEMOCCULT GUIAC POC 1CARD (OFFICE)

## 2011-10-07 LAB — POCT URINALYSIS DIPSTICK

## 2011-10-07 MED ORDER — CLINDAMYCIN HCL 300 MG PO CAPS: 300.0000 mg | ORAL_CAPSULE | Freq: Three times a day (TID) | ORAL | Status: AC

## 2011-10-07 MED ORDER — DOXYCYCLINE HYCLATE 100 MG PO TABS: 100.0000 mg | ORAL_TABLET | Freq: Two times a day (BID) | ORAL | Status: AC

## 2011-10-07 NOTE — Progress Notes (Signed)
Subjective:    Patient ID: Gerald [name], male    DOB: 01/17/1956, 55 y.o.   MRN: [id number]  HPI He is here for complete examination. He has multiple complaints. He has had a three-week history of difficulty with sinus congestion, PND, malaise but no fever, chills or sore throat. He again notes crusting mainly on the left nostril. He has had difficulty with abdominal gas both eructation and flatulence. He has not made any dietary changes. He does complain of right-sided low back pain that tends to come and go. No weakness, numbness or tingling. He also has painful lesions on the plantar surface of both feet.  Review of Systems Negative except as above    Objective:   Physical Exam BP 110/74  Pulse 75  Ht 6' (1.829 m)  Wt 183 lb (83.008 kg)  BMI 24.82 kg/m2  General Appearance:    Alert, cooperative, no distress, appears stated age  Head:    Normocephalic, without obvious abnormality, atraumatic  Eyes:    PERRL, conjunctiva/corneas clear, EOM's intact, fundi    benign  Ears:    Normal TM's and external ear canals  Nose:   Nares normal, mucosa red especially on the left, no drainage or sinus   Tenderness. Sinuses are nontender  Throat:   Lips, mucosa, and tongue normal; teeth and gums normal  Neck:   Supple, no lymphadenopathy;  thyroid:  no   enlargement/tenderness/nodules; no carotid   bruit or JVD  Back:    Spine nontender, no curvature, ROM normal, no CVA     tenderness  Lungs:     Clear to auscultation bilaterally without wheezes, rales or     ronchi; respirations unlabored  Chest Wall:    No tenderness or deformity   Heart:    Regular rate and rhythm, S1 and S2 normal, no murmur, rub   or gallop  Breast Exam:    No chest wall tenderness, masses or gynecomastia  Abdomen:     Soft, non-tender, nondistended, normoactive bowel sounds,    no masses, no hepatosplenomegaly  Genitalia:    Normal male external genitalia without lesions.  Testicles without masses.  No inguinal  hernias.  Rectal:    Normal sphincter tone, no masses or tenderness; guaiac negative stool.  Prostate smooth, no nodules, not enlarged.  Extremities:   No clubbing, cyanosis or edema.exam of the plantar surface of both feet does show lesions on the plantar fascia better tender to palpation. Back exam does show some questionable tenderness over the right SI joint. Provocative testing was equivocal. Negative straight leg raising with normal DTRs.   Pulses:   2+ and symmetric all extremities  Skin:   Skin color, texture, turgor normal, no rashes or lesions  Lymph nodes:   Cervical, supraclavicular, and axillary nodes normal  Neurologic:   CNII-XII intact, normal strength, sensation and gait; reflexes 2+ and symmetric throughout          Psych:   Normal mood, affect, hygiene and grooming.           Assessment & Plan:   1. Foot pain  Ambulatory referral to Orthopedic Surgery  2. GERD    3. Sacroiliitis  Ambulatory referral to Physical Therapy  4. Routine general medical examination at a health care facility  POCT Urinalysis Dipstick, CBC with Differential, Comprehensive metabolic panel, Lipid panel, POCT Occult Blood Stool  5. Sinusitis acute     I will place him back on doxycycline and clindamycin. He will let me  know how this works. We'll also refer to orthopedics as well as physical therapy. No particular therapy for the increased gas other than Gas-X.

## 2011-10-07 NOTE — Patient Instructions (Signed)
Take all the antibiotic and call me if not totally back to normal 

## 2011-10-09 ENCOUNTER — Encounter: Payer: Self-pay | Admitting: Family Medicine

## 2011-10-09 ENCOUNTER — Ambulatory Visit (INDEPENDENT_AMBULATORY_CARE_PROVIDER_SITE_OTHER): Payer: BC Managed Care – PPO | Admitting: Family Medicine

## 2011-10-09 VITALS — BP 110/70 | HR 94 | Wt 183.0 lb

## 2011-10-09 DIAGNOSIS — E781 Pure hyperglyceridemia: Secondary | ICD-10-CM

## 2011-10-09 MED ORDER — FENOFIBRATE 145 MG PO TABS: 145.0000 mg | ORAL_TABLET | Freq: Every day | ORAL | Status: DC

## 2011-10-09 NOTE — Progress Notes (Signed)
  Subjective:    Patient ID: Gerald [name], male    DOB: 14-May-1956, 55 y.o.   MRN: [id number]  HPI He is here for consult concerning recent blood work which did show continued elevation of his triglycerides. He states that he has a very good diet in terms of fat consumption. He does have ice cream on a regular basis. His alcohol consumption is minimal. Family history is negative for cardiac disease.   Review of Systems     Objective:   Physical Exam Alert and in no distress otherwise not examined       Assessment & Plan:  Hypertriglyceridemia. I discussed dietary modification, exercise, reducing carbohydrates. I will also place him on fenofibrate. Discussed possible side effects of this. I will recheck this in approximately 2 months.

## 2011-10-09 NOTE — Patient Instructions (Signed)
Check over your diet and make changes that are appropriate. I will see you in 2 months.

## 2011-10-16 ENCOUNTER — Ambulatory Visit: Payer: BC Managed Care – PPO | Admitting: Family Medicine

## 2011-11-29 ENCOUNTER — Encounter: Payer: Self-pay | Admitting: Internal Medicine

## 2011-12-10 ENCOUNTER — Encounter: Payer: Self-pay | Admitting: Family Medicine

## 2011-12-10 ENCOUNTER — Ambulatory Visit (INDEPENDENT_AMBULATORY_CARE_PROVIDER_SITE_OTHER): Payer: BC Managed Care – PPO | Admitting: Family Medicine

## 2011-12-10 VITALS — BP 104/72 | HR 96 | Wt 181.0 lb

## 2011-12-10 DIAGNOSIS — Z79899 Other long term (current) drug therapy: Secondary | ICD-10-CM

## 2011-12-10 DIAGNOSIS — E785 Hyperlipidemia, unspecified: Secondary | ICD-10-CM

## 2011-12-10 LAB — LIPID PANEL
Cholesterol: 180 mg/dL (ref 0–200)
HDL: 35 mg/dL — ABNORMAL LOW (ref >39)
LDL Cholesterol: 100 mg/dL — ABNORMAL HIGH (ref 0–99)
Total CHOL/HDL Ratio: 5.1 Ratio
Triglycerides: 225 mg/dL — ABNORMAL HIGH (ref <150)
VLDL: 45 mg/dL — ABNORMAL HIGH (ref 0–40)

## 2011-12-10 NOTE — Patient Instructions (Signed)
We'll call you with the results of the blood work tomorrow

## 2011-12-10 NOTE — Progress Notes (Signed)
  Subjective:    Patient ID: Gerald [name], male    DOB: 1956/04/17, 56 y.o.   MRN: [id number]  HPI He is here for a recheck. He is taking phenobarbital he has no particular concerns or complaints.   Review of Systems     Objective:   Physical Exam Alert and in no distress otherwise not examined       Assessment & Plan:   1. Hyperlipidemia  Lipid panel  2. Encounter for long-term (current) use of other medications  Lipid panel

## 2012-01-27 ENCOUNTER — Ambulatory Visit (INDEPENDENT_AMBULATORY_CARE_PROVIDER_SITE_OTHER): Payer: BC Managed Care – PPO | Admitting: Internal Medicine

## 2012-01-27 ENCOUNTER — Other Ambulatory Visit (INDEPENDENT_AMBULATORY_CARE_PROVIDER_SITE_OTHER): Payer: BC Managed Care – PPO

## 2012-01-27 ENCOUNTER — Encounter: Payer: Self-pay | Admitting: Internal Medicine

## 2012-01-27 VITALS — BP 118/64 | HR 92 | Ht 72.0 in | Wt 184.0 lb

## 2012-01-27 DIAGNOSIS — K279 Peptic ulcer, site unspecified, unspecified as acute or chronic, without hemorrhage or perforation: Secondary | ICD-10-CM

## 2012-01-27 DIAGNOSIS — K219 Gastro-esophageal reflux disease without esophagitis: Secondary | ICD-10-CM

## 2012-01-27 DIAGNOSIS — R141 Gas pain: Secondary | ICD-10-CM

## 2012-01-27 LAB — IGA: IgA: 175 mg/dL (ref 68–378)

## 2012-01-27 MED ORDER — ALIGN 4 MG PO CAPS: 1.0000 | ORAL_CAPSULE | Freq: Every day | ORAL | Status: DC

## 2012-01-27 NOTE — Patient Instructions (Signed)
Please go directly to the basement to have labs drawn.  You have been scheduled for a gastric emptying scan at [facility] on 02-11-12 at 8:00am. Please arrive at least 15 minutes prior to your appointment for registration. Please make certain not to have anything to eat or drink after midnight the night before your test. Hold all stomach medications (ex: Zofran, phenergan, Reglan) 48 hours prior to your test. If you need to reschedule your appointment, please contact radiology scheduling at [phone].   We have given you samples of Align. This puts good bacteria back into your intestines. You should take 1 capsule by mouth once daily. If this works well for you, it can be purchased over the counter.

## 2012-01-27 NOTE — Progress Notes (Signed)
HISTORY OF PRESENT ILLNESS:  Gerald [name] is a 56 y.o. male who presents today with chief complaint of nausea, bloating, and borborygmi. 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 Also increased intestinal gas. This dictation. He continues to have problems with bloating. He also mentions hyperactive bowel sounds. However, his bile habits are regular. No weight loss. Problems with nausea but no vomiting (except for one occasion last weekend after a meal). He stays on Nexium regular. No active GERD symptoms. No dysphagia.. Previous upper endoscopy unrevealing. Colonoscopy in 2007 negative.  REVIEW OF SYSTEMS:  All non-GI ROS negative except for back pain, sinus and allergy trouble  Past Medical History  Diagnosis Date  . Duodenal ulcer   . GERD (gastroesophageal reflux disease)   . PUD (peptic ulcer disease)   . Substance abuse     TOBACCO  . Spondylosis   . Scoliosis   . Smoker   . HH (hiatus hernia)   . Leg length discrepancy   . Hyperlipemia     Past Surgical History  Procedure Date  . Knee arthroscopy     Social History Gerald [name]  reports that he has been smoking Cigarettes.  He has never used smokeless tobacco. He reports that he drinks alcohol. He reports that he does not use illicit drugs.  family history includes Arthritis in his mother; Colon polyps in his mother; and Stroke in his father.  There is no history of Colon cancer.  No Known Allergies     PHYSICAL EXAMINATION: Vital signs: BP 118/64  Pulse 92  Ht 6' (1.829 m)  Wt 184 lb (83.462 kg)  BMI 24.95 kg/m2 General: Well-developed, well-nourished, no acute distress HEENT: Sclerae are anicteric, conjunctiva pink. Oral mucosa intact Lungs: Clear Heart: Regular Abdomen: soft, nontender, nondistended, no obvious ascites, no peritoneal signs, normal bowel sounds. No organomegaly. No succussion splash. Extremities: No edema Psychiatric: alert and oriented x3.  Cooperative     ASSESSMENT:  #1. Nausea, bloating, and borborygmi. On going from last year. No alarm features. Suspect functional #2. Possible GERD. Continues on PPI #3. Screening colonoscopy 2007 negative.    PLAN:  #1. Screen for celiac disease with tissue transglutaminase antibody and serum IgA level #2. Treatment with probiotic align one daily x4 weeks. Samples given #3. Gastric emptying scan to rule out significant gastroparesis #4. Further plans pending the above #5. continue PPI and antireflux precautions

## 2012-01-28 LAB — TISSUE TRANSGLUTAMINASE, IGA: Tissue Transglutaminase Ab, IgA: 2.8 U/mL (ref <20)

## 2012-02-11 ENCOUNTER — Encounter (HOSPITAL_COMMUNITY)
Admission: RE | Admit: 2012-02-11 | Discharge: 2012-02-11 | Disposition: A | Discharge status: Home / Self Care | Payer: BC Managed Care – PPO | Source: Ambulatory Visit | Attending: Internal Medicine | Admitting: Internal Medicine

## 2012-02-11 DIAGNOSIS — K279 Peptic ulcer, site unspecified, unspecified as acute or chronic, without hemorrhage or perforation: Secondary | ICD-10-CM

## 2012-02-11 DIAGNOSIS — R110 Nausea: Secondary | ICD-10-CM | POA: Insufficient documentation

## 2012-02-11 DIAGNOSIS — R141 Gas pain: Secondary | ICD-10-CM | POA: Insufficient documentation

## 2012-02-11 DIAGNOSIS — R142 Eructation: Secondary | ICD-10-CM | POA: Insufficient documentation

## 2012-02-11 DIAGNOSIS — K219 Gastro-esophageal reflux disease without esophagitis: Secondary | ICD-10-CM

## 2012-02-11 MED ORDER — TECHNETIUM TC 99M SULFUR COLLOID
2.1000 | Freq: Once | INTRAVENOUS | Status: AC | PRN
  Administered 2012-02-11: 2.1 via INTRAVENOUS

## 2012-03-19 ENCOUNTER — Other Ambulatory Visit: Payer: Self-pay | Admitting: Internal Medicine

## 2012-04-14 ENCOUNTER — Telehealth: Payer: Self-pay | Admitting: Family Medicine

## 2012-04-14 MED ORDER — DOXYCYCLINE HYCLATE 100 MG PO TABS: 100.0000 mg | ORAL_TABLET | Freq: Two times a day (BID) | ORAL | Status: AC

## 2012-04-14 NOTE — Telephone Encounter (Signed)
He has a similar episode in August of last year. Call in doxycycline and let him know if he does not improve, he will need an appointment

## 2012-04-14 NOTE — Telephone Encounter (Signed)
SENT MED IN AND INFORMED PT WORD FOR WORD

## 2012-05-05 ENCOUNTER — Ambulatory Visit (INDEPENDENT_AMBULATORY_CARE_PROVIDER_SITE_OTHER): Payer: BC Managed Care – PPO | Admitting: Family Medicine

## 2012-05-05 ENCOUNTER — Encounter: Payer: Self-pay | Admitting: Family Medicine

## 2012-05-05 VITALS — BP 120/68 | HR 100 | Wt 182.0 lb

## 2012-05-05 DIAGNOSIS — R5381 Other malaise: Secondary | ICD-10-CM

## 2012-05-05 DIAGNOSIS — R6882 Decreased libido: Secondary | ICD-10-CM

## 2012-05-05 DIAGNOSIS — J019 Acute sinusitis, unspecified: Secondary | ICD-10-CM

## 2012-05-05 DIAGNOSIS — R5383 Other fatigue: Secondary | ICD-10-CM

## 2012-05-05 LAB — TSH: TSH: 1.879 u[IU]/mL (ref 0.350–4.500)

## 2012-05-05 LAB — TESTOSTERONE: Testosterone: 141.62 ng/dL — ABNORMAL LOW (ref 300–890)

## 2012-05-05 MED ORDER — DOXYCYCLINE HYCLATE 100 MG PO TABS: 100.0000 mg | ORAL_TABLET | Freq: Two times a day (BID) | ORAL | Status: AC

## 2012-05-05 NOTE — Progress Notes (Signed)
  Subjective:    Patient ID: Gerald [name], male    DOB: [birth year], 56 y.o.   MRN: [id number]  HPI He complains of fatigue and intermittent headache backache, flatulence, decreased libido. He is concerned that the fenofibrate might be causing some these symptoms. He has no hot or cold intolerance, skin changes,, history of constipation  Also placed on doxycycline for treatment of a sinus infection. He did have upper tooth discomfort on the left which is better but still has left maxillary sinus tenderness.   Review of Systems     Objective:   Physical Exam alert and in no distress. Tympanic membranes and canals are normal. Throat is clear. Tonsils are normal. Nasal mucosa is red with evidence of recent bleeding in the left nostril. Slight tenderness over left maxillary sinus. Neck is supple without adenopathy or thyromegaly. Cardiac exam shows a regular sinus rhythm without murmurs or gallops. Lungs are clear to auscultation.        Assessment & Plan:   1. Fatigue  Testosterone, TSH  2. Libido, decreased  Testosterone  3. Sinusitis acute  doxycycline (VIBRA-TABS) 100 MG tablet  Stop the fenofibrate for the next week and let me know how you're doing and when you finish the antibiotic call me and let me know how you're doing. If you're still having sinus symptoms I will order a CT scan.

## 2012-05-05 NOTE — Patient Instructions (Addendum)
Stop the fenofibrate for the next week and let me know how you're doing and when you finish the antibiotic call me and let me know how you're doing. If you're still having sinus symptoms I will order a CT scan.

## 2012-05-06 ENCOUNTER — Other Ambulatory Visit: Payer: Self-pay

## 2012-05-06 DIAGNOSIS — R7989 Other specified abnormal findings of blood chemistry: Secondary | ICD-10-CM

## 2012-05-08 ENCOUNTER — Other Ambulatory Visit: Payer: BC Managed Care – PPO

## 2012-05-08 DIAGNOSIS — R7989 Other specified abnormal findings of blood chemistry: Secondary | ICD-10-CM

## 2012-05-08 LAB — LUTEINIZING HORMONE: LH: 6.3 m[IU]/mL (ref 1.5–9.3)

## 2012-05-08 LAB — FOLLICLE STIMULATING HORMONE: FSH: 9.3 m[IU]/mL (ref 1.4–18.1)

## 2012-05-08 LAB — PROLACTIN: Prolactin: 3.4 ng/mL (ref 2.1–17.1)

## 2012-05-08 LAB — TESTOSTERONE: Testosterone: 241.24 ng/dL — ABNORMAL LOW (ref 300–890)

## 2012-05-11 ENCOUNTER — Telehealth: Payer: Self-pay

## 2012-05-11 NOTE — Telephone Encounter (Signed)
PT HAS APPT

## 2012-05-12 ENCOUNTER — Ambulatory Visit (INDEPENDENT_AMBULATORY_CARE_PROVIDER_SITE_OTHER): Payer: BC Managed Care – PPO | Admitting: Family Medicine

## 2012-05-12 ENCOUNTER — Telehealth: Payer: Self-pay | Admitting: Family Medicine

## 2012-05-12 DIAGNOSIS — E291 Testicular hypofunction: Secondary | ICD-10-CM | POA: Insufficient documentation

## 2012-05-12 MED ORDER — TESTOSTERONE 50 MG/5GM (1%) TD GEL: 5.0000 g | Freq: Every day | TRANSDERMAL | Status: DC

## 2012-05-12 NOTE — Telephone Encounter (Signed)
Lmom reminding the patient to make sure he is taking his current cholesterol medication on a daily bases as well as a low fat diet. I also advised him to come in to get his cholesterol check. CLS

## 2012-05-12 NOTE — Telephone Encounter (Signed)
Pt called and stated that his ins company will cover axiron and fortesta. Both will require a prior authorization but those are the preferred. Please fill which ever one you think needs to be on to start prior authorization process.

## 2012-05-12 NOTE — Telephone Encounter (Signed)
Patient very rude but informed to take his fenofibrate when he is done with antibiotic

## 2012-05-12 NOTE — Telephone Encounter (Signed)
Message copied by Janeice Robinson on Tue May 12, 2012  9:35 AM ------      Message from: Condon, Kermit Balo      Created: Fri May 08, 2012 10:52 AM       Gerald [name] noticed his blood looked viscous like high lipids.   Dr. Susann Givens didn't specifically order a lipid this most recent visit.  Pls call pt and make sure he is taking the cholesterol/triglyceride medication every day and should be eating a low fat diet.  Please inquires.              Dr. Susann Givens may want to recheck his lipids again soon.

## 2012-05-12 NOTE — Telephone Encounter (Signed)
Patient called and wanted to know if he should stop the cholesterol medication. Alvino Chapel drawed his blood on 05/05/12 and she spun it. Once she was done she came in the nurses station and showed Vincenza Hews and it look viscous like he has high cholesterol so therefore Vincenza Hews ask me to call him to make sure that he was still on his cholesterol medication and that he was taking it everyday. And so I did. Patient states that he was told Dr. Susann Givens to stop his cholesterol medication so the patient is unsure of what he should be doing about the medication at this time. CLS

## 2012-05-12 NOTE — Progress Notes (Signed)
  Subjective:    Patient ID: Gerald [name], male    DOB: Oct 03, 1956, 56 y.o.   MRN: [id number]  HPI He is here for consult. He has had 2 testosterone levels. Both of them were well below 200. He also has had pituitary testing which was negative. He does have decreased energy as well as stamina and to a lesser extent libido issues.   Review of Systems     Objective:   Physical Exam Alert and in no distress otherwise not examined       Assessment & Plan:   1. Hypogonadism male    I did talk about testosterone replacement in the various therapies for this. He I will give him Testim but have him check with his insurance to see if this is covered. Discussed possible side effects with him including DVT, risk of prostate cancer etc. He is to return here in one month for recheck.

## 2012-05-12 NOTE — Telephone Encounter (Signed)
He is to continue on his fenofibrate. I think that he got his signals crossed. The note actually indicates that he is supposed  to continue

## 2012-05-13 ENCOUNTER — Telehealth: Payer: Self-pay | Admitting: Family Medicine

## 2012-05-13 NOTE — Telephone Encounter (Signed)
Switch him to [place] and have him come by to get a discount card.

## 2012-05-14 ENCOUNTER — Telehealth: Payer: Self-pay

## 2012-05-14 MED ORDER — TESTOSTERONE 10 MG/ACT (2%) TD GEL: 4.0000 | Freq: Every day | TRANSDERMAL | Status: DC

## 2012-05-14 NOTE — Telephone Encounter (Signed)
Called med in 

## 2012-05-14 NOTE — Telephone Encounter (Signed)
4 pumps daily to be applied to the inner thighs

## 2012-05-14 NOTE — Telephone Encounter (Signed)
DR.lALONDE  WHAT DOSE DO YOU WANT HIM ON

## 2012-05-19 ENCOUNTER — Telehealth: Payer: Self-pay | Admitting: Family Medicine

## 2012-05-19 NOTE — Telephone Encounter (Signed)
LM

## 2012-06-23 ENCOUNTER — Encounter: Payer: Self-pay | Admitting: Family Medicine

## 2012-06-23 ENCOUNTER — Ambulatory Visit (INDEPENDENT_AMBULATORY_CARE_PROVIDER_SITE_OTHER): Payer: BC Managed Care – PPO | Admitting: Family Medicine

## 2012-06-23 VITALS — BP 114/80 | HR 106 | Wt 180.0 lb

## 2012-06-23 DIAGNOSIS — E291 Testicular hypofunction: Secondary | ICD-10-CM

## 2012-06-23 LAB — TESTOSTERONE: Testosterone: 237.94 ng/dL — ABNORMAL LOW (ref 300–890)

## 2012-06-23 NOTE — Progress Notes (Signed)
  Subjective:    Patient ID: Gerald [name], male    DOB: 12-09-1955, 56 y.o.   MRN: [id number]  HPI He is here for recheck on testosterone. He has noted increase in energy and stamina and not much change in libido.   Review of Systems     Objective:   Physical Exam Alert and in no distress otherwise not examined       Assessment & Plan:   1. Hypogonadism male  Testosterone

## 2012-10-12 ENCOUNTER — Ambulatory Visit (INDEPENDENT_AMBULATORY_CARE_PROVIDER_SITE_OTHER): Payer: BC Managed Care – PPO | Admitting: Family Medicine

## 2012-10-12 ENCOUNTER — Encounter: Payer: Self-pay | Admitting: Family Medicine

## 2012-10-12 ENCOUNTER — Other Ambulatory Visit: Payer: Self-pay | Admitting: Family Medicine

## 2012-10-12 VITALS — BP 118/78 | HR 82 | Ht 72.0 in | Wt 186.0 lb

## 2012-10-12 DIAGNOSIS — E291 Testicular hypofunction: Secondary | ICD-10-CM

## 2012-10-12 DIAGNOSIS — M722 Plantar fascial fibromatosis: Secondary | ICD-10-CM

## 2012-10-12 DIAGNOSIS — Z0000 Encounter for general adult medical examination without abnormal findings: Secondary | ICD-10-CM

## 2012-10-12 DIAGNOSIS — L301 Dyshidrosis [pompholyx]: Secondary | ICD-10-CM

## 2012-10-12 DIAGNOSIS — Z87891 Personal history of nicotine dependence: Secondary | ICD-10-CM | POA: Insufficient documentation

## 2012-10-12 DIAGNOSIS — L0390 Cellulitis, unspecified: Secondary | ICD-10-CM

## 2012-10-12 DIAGNOSIS — L0291 Cutaneous abscess, unspecified: Secondary | ICD-10-CM

## 2012-10-12 DIAGNOSIS — K219 Gastro-esophageal reflux disease without esophagitis: Secondary | ICD-10-CM

## 2012-10-12 DIAGNOSIS — F17200 Nicotine dependence, unspecified, uncomplicated: Secondary | ICD-10-CM

## 2012-10-12 LAB — LIPID PANEL
Cholesterol: 179 mg/dL (ref 0–200)
HDL: 40 mg/dL (ref >39)
LDL Cholesterol: 105 mg/dL — ABNORMAL HIGH (ref 0–99)
Total CHOL/HDL Ratio: 4.5 Ratio
Triglycerides: 169 mg/dL — ABNORMAL HIGH (ref <150)
VLDL: 34 mg/dL (ref 0–40)

## 2012-10-12 LAB — COMPREHENSIVE METABOLIC PANEL
ALT: 13 U/L (ref 0–53)
AST: 15 U/L (ref 0–37)
Albumin: 4.6 g/dL (ref 3.5–5.2)
Alkaline Phosphatase: 51 U/L (ref 39–117)
BUN: 20 mg/dL (ref 6–23)
CO2: 27 mEq/L (ref 19–32)
Calcium: 9.9 mg/dL (ref 8.4–10.5)
Chloride: 105 mEq/L (ref 96–112)
Creat: 0.95 mg/dL (ref 0.50–1.35)
Glucose, Bld: 93 mg/dL (ref 70–99)
Potassium: 4.7 mEq/L (ref 3.5–5.3)
Sodium: 140 mEq/L (ref 135–145)
Total Bilirubin: 0.4 mg/dL (ref 0.3–1.2)
Total Protein: 7.2 g/dL (ref 6.0–8.3)

## 2012-10-12 LAB — POCT URINALYSIS DIPSTICK
Bilirubin, UA: NEGATIVE
Blood, UA: NEGATIVE
Glucose, UA: NEGATIVE
Ketones, UA: NEGATIVE
Leukocytes, UA: NEGATIVE
Nitrite, UA: NEGATIVE
Protein, UA: NEGATIVE
Spec Grav, UA: 1.015
Urobilinogen, UA: NEGATIVE
pH, UA: 5

## 2012-10-12 LAB — CBC WITH DIFFERENTIAL/PLATELET
Basophils Absolute: 0 10*3/uL (ref 0.0–0.1)
Basophils Relative: 0 % (ref 0–1)
Eosinophils Absolute: 0.2 10*3/uL (ref 0.0–0.7)
Eosinophils Relative: 2 % (ref 0–5)
HCT: 41.8 % (ref 39.0–52.0)
Hemoglobin: 14.4 g/dL (ref 13.0–17.0)
Lymphocytes Relative: 44 % (ref 12–46)
Lymphs Abs: 3.1 10*3/uL (ref 0.7–4.0)
MCH: 32 pg (ref 26.0–34.0)
MCHC: 34.4 g/dL (ref 30.0–36.0)
MCV: 92.9 fL (ref 78.0–100.0)
Monocytes Absolute: 0.6 10*3/uL (ref 0.1–1.0)
Monocytes Relative: 8 % (ref 3–12)
Neutro Abs: 3.3 10*3/uL (ref 1.7–7.7)
Neutrophils Relative %: 46 % (ref 43–77)
Platelets: 379 10*3/uL (ref 150–400)
RBC: 4.5 MIL/uL (ref 4.22–5.81)
RDW: 14 % (ref 11.5–15.5)
WBC: 7.1 10*3/uL (ref 4.0–10.5)

## 2012-10-12 MED ORDER — DOXYCYCLINE HYCLATE 100 MG PO TABS: 100.0000 mg | ORAL_TABLET | Freq: Two times a day (BID) | ORAL | Status: DC

## 2012-10-12 NOTE — Patient Instructions (Addendum)
Take a baby aspirin every day. Take the antibiotic for the full month and let me know how you're doing. We can refer you to a different ENT. Work on her smoking in terms of changing her behavior patterns especially in the morning when you go to the garage. If you have trouble with quitting smoking, call me and we can discuss put you on Zyban.

## 2012-10-12 NOTE — Progress Notes (Signed)
Subjective:    Patient ID: Gerald [name], male    DOB: 1956-03-02, 56 y.o.   MRN: [id number]  HPI He is here for a complete examination. He smokes less than a pack per day. He would like to quit smoking again. He has done this in the past and started usually when he got stressed. He also has some lesions present on the left fourth finger that he would like evaluated. They do cause some slight itching. He stopped taking his testosterone stating he got tired of using it and he saw no benefit. Review of the record indicates that his testosterone was just brought up into the mid 200 range. He continues to have difficulty with nasal discomfort mainly on the left. He has been seen in the past by ENT and they gave him Bactroban nasal which he states he's been using fairly regularly for the last year with no real benefit. He has a previous history of difficulty with plantar fasciitis as well as plantar fibromatosis. This is starting to recur. He does have reflux and is having no difficulty with that.  Review of Systems Negative except as above.    Objective:   Physical Exam BP 118/78  Pulse 82  Ht 6' (1.829 m)  Wt 186 lb (84.369 kg)  BMI 25.23 kg/m2  General Appearance:    Alert, cooperative, no distress, appears stated age  Head:    Normocephalic, without obvious abnormality, atraumatic  Eyes:    PERRL, conjunctiva/corneas clear, EOM's intact, fundi    benign  Ears:    Normal TM's and external ear canals  Nose:   Nares normal, mucosa shows slight erythema and tenderness in the left lateral nostril area to, no drainage or sinus   tenderness  Throat:   Lips, mucosa, and tongue normal; teeth and gums normal  Neck:   Supple, no lymphadenopathy;  thyroid:  no   enlargement/tenderness/nodules; no carotid   bruit or JVD  Back:    Spine nontender, no curvature, ROM normal, no CVA     tenderness  Lungs:     Clear to auscultation bilaterally without wheezes, rales or     ronchi; respirations unlabored   Chest Wall:    No tenderness or deformity   Heart:    Regular rate and rhythm, S1 and S2 normal, no murmur, rub   or gallop  Breast Exam:    No chest wall tenderness, masses or gynecomastia  Abdomen:     Soft, non-tender, nondistended, normoactive bowel sounds,    no masses, no hepatosplenomegaly  Genitalia:   deferred   Rectal:   deferred at patient request   Extremities:   No clubbing, cyanosis or edema  Pulses:   2+ and symmetric all extremities  Skin:   Skin color, texture, turgor normal, slight swelling and vesicular lesions are noted at the PIP joint area   Lymph nodes:   Cervical, supraclavicular, and axillary nodes normal  Neurologic:   CNII-XII intact, normal strength, sensation and gait; reflexes 2+ and symmetric throughout          Psych:   Normal mood, affect, hygiene and grooming.           Assessment & Plan:  He refused flu shot, PSA 1. Routine general medical examination at a health care facility  POCT Urinalysis Dipstick, Lipid panel, CBC with Differential, Comprehensive metabolic panel  2. GERD    3. Hypogonadism male    4. Cellulitis  doxycycline (VIBRA-TABS) 100 MG tablet  5.  Current smoker    6. Plantar fasciitis    7. Dyshidrosis    8. Plantar fibromatosis     I will recheck his testosterone level and possibly place him on Axiron 2 see if I can get better blood levels. Doxycycline for a month to see if it helps with his nose and possibly refer if no improvement. Discussed smoking cessation with him in detail in regard to the fact that most of his problem is triggers in making smoke specifically playing golf, drinking beer, working on his computer and stress. He recognizes this and I then recommended that he make lists of things to do instead of smoking. For the plantar fasciitis I recommend stretching as well as using ice and get an orthotic made especially since he has a fibromatosis he is to use cortisone cream on the dyshidrotic changes .

## 2012-10-13 LAB — TESTOSTERONE: Testosterone: 273.08 ng/dL — ABNORMAL LOW (ref 300–890)

## 2012-10-13 NOTE — Progress Notes (Signed)
Quick Note:  Let him know that his testosterone level is still low and I think he would benefit. I would like to place him on Axiron. Call this in and see if his insurance will cover this and let him know. If they get good coverage I will recheck this in one month. ______

## 2012-10-14 ENCOUNTER — Other Ambulatory Visit: Payer: Self-pay

## 2012-10-14 MED ORDER — TESTOSTERONE 30 MG/ACT TD SOLN: 1.0000 | Freq: Every day | TRANSDERMAL | Status: DC

## 2012-10-14 NOTE — Progress Notes (Signed)
Quick Note:  Pt informed verbalized understanding and med was called in ______

## 2012-10-14 NOTE — Telephone Encounter (Signed)
Called axrion in per [employer] pt agreed

## 2012-10-16 ENCOUNTER — Telehealth: Payer: Self-pay | Admitting: Family Medicine

## 2012-10-19 ENCOUNTER — Other Ambulatory Visit: Payer: Self-pay | Admitting: Family Medicine

## 2012-10-19 ENCOUNTER — Telehealth: Payer: Self-pay | Admitting: Family Medicine

## 2012-10-19 NOTE — Telephone Encounter (Signed)
LEFT PT MESSAGE, FAXED PHARMACY

## 2012-10-23 NOTE — Telephone Encounter (Signed)
LM

## 2012-11-20 ENCOUNTER — Telehealth: Payer: Self-pay

## 2012-11-20 MED ORDER — ESOMEPRAZOLE MAGNESIUM 40 MG PO CPDR: 40.0000 mg | DELAYED_RELEASE_CAPSULE | Freq: Every day | ORAL | Status: DC

## 2012-11-20 NOTE — Telephone Encounter (Signed)
Refilled nexium 

## 2012-11-25 ENCOUNTER — Telehealth: Payer: Self-pay

## 2012-11-25 MED ORDER — ESOMEPRAZOLE MAGNESIUM 40 MG PO CPDR: 40.0000 mg | DELAYED_RELEASE_CAPSULE | Freq: Every day | ORAL | Status: DC

## 2012-11-25 NOTE — Telephone Encounter (Signed)
Received request for prior authorization for Nexium; [employer] and received prior authorization for 36 months; sent refill of Nexium to Pharmacy

## 2012-12-04 ENCOUNTER — Telehealth: Payer: Self-pay | Admitting: Internal Medicine

## 2012-12-04 NOTE — Telephone Encounter (Signed)
Refer to ENT

## 2012-12-07 ENCOUNTER — Telehealth: Payer: Self-pay | Admitting: Family Medicine

## 2012-12-07 NOTE — Telephone Encounter (Signed)
Called pt he does have an ENT but wants a new one.  Called Dr. Narda Bonds [phone] scheduled appt for Friday 12/11/12 at 8:45.    Called pt made him aware.  Also sent notes to Dr. Ezzard Standing office.

## 2012-12-19 ENCOUNTER — Other Ambulatory Visit: Payer: Self-pay

## 2013-03-19 ENCOUNTER — Other Ambulatory Visit: Payer: Self-pay | Admitting: Internal Medicine

## 2013-07-08 ENCOUNTER — Telehealth: Payer: Self-pay | Admitting: Family Medicine

## 2013-07-08 NOTE — Telephone Encounter (Signed)
Wife called and states husband quit smoking back in July and has gained about 9 pounds.  What can you advise to help him lose weight.  Please call Ollen Gross or pt [phone] Also any advice would be good

## 2013-07-12 ENCOUNTER — Other Ambulatory Visit: Payer: Self-pay

## 2013-09-09 ENCOUNTER — Other Ambulatory Visit: Payer: Self-pay

## 2013-10-11 ENCOUNTER — Other Ambulatory Visit: Payer: BC Managed Care – PPO

## 2013-10-11 DIAGNOSIS — R7989 Other specified abnormal findings of blood chemistry: Secondary | ICD-10-CM

## 2013-10-11 DIAGNOSIS — Z79899 Other long term (current) drug therapy: Secondary | ICD-10-CM

## 2013-10-11 DIAGNOSIS — E785 Hyperlipidemia, unspecified: Secondary | ICD-10-CM

## 2013-10-11 LAB — CBC WITH DIFFERENTIAL/PLATELET
Basophils Absolute: 0 10*3/uL (ref 0.0–0.1)
Basophils Relative: 0 % (ref 0–1)
Eosinophils Absolute: 0.3 10*3/uL (ref 0.0–0.7)
Eosinophils Relative: 3 % (ref 0–5)
HCT: 38.8 % — ABNORMAL LOW (ref 39.0–52.0)
Hemoglobin: 13.5 g/dL (ref 13.0–17.0)
Lymphocytes Relative: 42 % (ref 12–46)
Lymphs Abs: 3.1 10*3/uL (ref 0.7–4.0)
MCH: 31.6 pg (ref 26.0–34.0)
MCHC: 34.8 g/dL (ref 30.0–36.0)
MCV: 90.9 fL (ref 78.0–100.0)
Monocytes Absolute: 0.5 10*3/uL (ref 0.1–1.0)
Monocytes Relative: 7 % (ref 3–12)
Neutro Abs: 3.5 10*3/uL (ref 1.7–7.7)
Neutrophils Relative %: 48 % (ref 43–77)
Platelets: 410 10*3/uL — ABNORMAL HIGH (ref 150–400)
RBC: 4.27 MIL/uL (ref 4.22–5.81)
RDW: 14 % (ref 11.5–15.5)
WBC: 7.4 10*3/uL (ref 4.0–10.5)

## 2013-10-12 LAB — COMPREHENSIVE METABOLIC PANEL
ALT: 14 U/L (ref 0–53)
AST: 12 U/L (ref 0–37)
Albumin: 4.6 g/dL (ref 3.5–5.2)
Alkaline Phosphatase: 53 U/L (ref 39–117)
BUN: 19 mg/dL (ref 6–23)
CO2: 26 mEq/L (ref 19–32)
Calcium: 9.5 mg/dL (ref 8.4–10.5)
Chloride: 103 mEq/L (ref 96–112)
Creat: 1.14 mg/dL (ref 0.50–1.35)
Glucose, Bld: 109 mg/dL — ABNORMAL HIGH (ref 70–99)
Potassium: 4.3 mEq/L (ref 3.5–5.3)
Sodium: 138 mEq/L (ref 135–145)
Total Bilirubin: 0.3 mg/dL (ref 0.3–1.2)
Total Protein: 7 g/dL (ref 6.0–8.3)

## 2013-10-12 LAB — LIPID PANEL
Cholesterol: 175 mg/dL (ref 0–200)
HDL: 36 mg/dL — ABNORMAL LOW (ref >39)
LDL Cholesterol: 82 mg/dL (ref 0–99)
Total CHOL/HDL Ratio: 4.9 Ratio
Triglycerides: 284 mg/dL — ABNORMAL HIGH (ref <150)
VLDL: 57 mg/dL — ABNORMAL HIGH (ref 0–40)

## 2013-10-12 LAB — TESTOSTERONE: Testosterone: 300 ng/dL (ref 300–890)

## 2013-10-14 ENCOUNTER — Encounter: Payer: Self-pay | Admitting: Family Medicine

## 2013-10-14 ENCOUNTER — Telehealth: Payer: Self-pay | Admitting: Family Medicine

## 2013-10-14 ENCOUNTER — Ambulatory Visit (INDEPENDENT_AMBULATORY_CARE_PROVIDER_SITE_OTHER): Payer: BC Managed Care – PPO | Admitting: Family Medicine

## 2013-10-14 VITALS — BP 100/70 | HR 96 | Ht 72.0 in | Wt 193.0 lb

## 2013-10-14 DIAGNOSIS — E785 Hyperlipidemia, unspecified: Secondary | ICD-10-CM

## 2013-10-14 DIAGNOSIS — K2100 Gastro-esophageal reflux disease with esophagitis, without bleeding: Secondary | ICD-10-CM

## 2013-10-14 DIAGNOSIS — K219 Gastro-esophageal reflux disease without esophagitis: Secondary | ICD-10-CM

## 2013-10-14 DIAGNOSIS — E291 Testicular hypofunction: Secondary | ICD-10-CM

## 2013-10-14 MED ORDER — TESTOSTERONE 30 MG/ACT TD SOLN: TRANSDERMAL | Status: DC

## 2013-10-14 NOTE — Progress Notes (Signed)
Teaching Physician: Sharlot Gowda, MD Dictated By: Judithann Graves  Subjective:  Gerald [name] is a 57 y.o. male who presents for his annual complete physical. He has an appointment scheduled at the end of the month 11/02/13 with his GI for worsening GERD. Wonders if it is stress related - think his reflux and heartburn have worsened over the last month. Has been having night-time reflux, tasting acidic material in back of throat. Mild night-time cough. Also some dysphagia to solids - feels like food sitting in esophagus. Has intermittent burning midepigastric pain that manifests during or after meals. Resolves within five minutes or so. Currently taking Nexium 20 mg everyday in the morning - dropped down from 40 over the last couple of months. Supplements with Zantac for episodes of burning midepigastric pain. No CP, SOB, melena, change in bowel habits.  He has a history of hypogonadism and experienced little improvement in his day-to-day fatigue and mood taking Axiron and so he discontinued this. Serum Testosterone last measured 10/11/13 and was low-normal. Quit smoking in July but has resumed 1 cigarette per week.   Family and social histories were reviewed.   ROS negative except as in subjective.  Objective: Filed Vitals:   10/14/13 1016  BP: 100/70  Pulse: 96    Physical Exam:  BP 100/70  Pulse 96  Ht 6' (1.829 m)  Wt 193 lb (87.544 kg)  BMI 26.17 kg/m2  SpO2 98%  General Appearance:    Alert, cooperative, no distress, appears stated age  Head:    Normocephalic, without obvious abnormality, atraumatic  Eyes:    PERRL, conjunctiva/corneas clear, EOM's intact, fundi    benign  Ears:    Normal TM's and external ear canals  Nose:   Nares normal, mucosa normal, no drainage or sinus   tenderness  Throat:   Lips, mucosa, and tongue normal; teeth and gums normal  Neck:   Supple, no lymphadenopathy;  thyroid:  no   enlargement/tenderness/nodules; no carotid   bruit or JVD  Back:     Spine nontender, no curvature, ROM normal, no CVA     tenderness  Lungs:     Clear to auscultation bilaterally without wheezes, rales or     ronchi; respirations unlabored  Chest Wall:    No tenderness or deformity   Heart:    Regular rate and rhythm, S1 and S2 normal, no murmur, rub   or gallop  Breast Exam:    No chest wall tenderness, masses or gynecomastia  Abdomen:     Soft, non-tender, nondistended, normoactive bowel sounds,    no masses, no hepatosplenomegaly     Extremities:   No clubbing, cyanosis or edema  Pulses:   2+ and symmetric all extremities  Skin:   Skin color, texture, turgor normal, no rashes or lesions  Lymph nodes:   Cervical, supraclavicular, and axillary nodes normal  Neurologic:   CNII-XII intact, normal strength, sensation and gait; reflexes 2+ and symmetric throughout          Psych:   Normal mood, affect, hygiene and grooming.     Assessment and Plan: 1. Hypogonadism male Suboptimal rise in serum testosterone following 1 daily application of his Axiron to axillae. Will increase to 2 applications of his Axiron and check his serum testosterone at next visit.    2. Reflux esophagitis Plan is to follow up with his gastroenterologist 11/02/13.  3. GERD Will resume taking Nexium 40 mg daily and follow up with his gastroenterologist as above.  4. hyperlipidemia. He would like to stop his medications and see what his blood studies look like on no medication. I will reassess this in one month. Dr. Susann Givens was present for the encounter and agrees with the above assessment and plan.

## 2013-10-14 NOTE — Patient Instructions (Signed)
Increase Nexium to40 mg per day

## 2013-10-14 NOTE — Telephone Encounter (Signed)
THIS HAS BEEN CHANGED

## 2013-10-18 ENCOUNTER — Telehealth: Payer: Self-pay | Admitting: Internal Medicine

## 2013-10-18 ENCOUNTER — Other Ambulatory Visit: Payer: Self-pay

## 2013-10-18 NOTE — Telephone Encounter (Signed)
axiron called in

## 2013-10-18 NOTE — Telephone Encounter (Signed)
Done

## 2013-10-18 NOTE — Telephone Encounter (Signed)
Pt wife called stating that pt's testosterone med was not called in the other day when pt was here. Can you call in his testosterone to [facility] [place]

## 2013-10-19 ENCOUNTER — Telehealth: Payer: Self-pay | Admitting: Family Medicine

## 2013-10-21 NOTE — Telephone Encounter (Signed)
lm

## 2013-11-02 ENCOUNTER — Ambulatory Visit (INDEPENDENT_AMBULATORY_CARE_PROVIDER_SITE_OTHER): Payer: BC Managed Care – PPO | Admitting: Internal Medicine

## 2013-11-02 ENCOUNTER — Encounter: Payer: Self-pay | Admitting: Internal Medicine

## 2013-11-02 VITALS — BP 118/70 | HR 100 | Ht 72.0 in | Wt 195.4 lb

## 2013-11-02 DIAGNOSIS — R635 Abnormal weight gain: Secondary | ICD-10-CM

## 2013-11-02 DIAGNOSIS — R109 Unspecified abdominal pain: Secondary | ICD-10-CM

## 2013-11-02 DIAGNOSIS — K219 Gastro-esophageal reflux disease without esophagitis: Secondary | ICD-10-CM

## 2013-11-02 DIAGNOSIS — R141 Gas pain: Secondary | ICD-10-CM

## 2013-11-02 MED ORDER — ESOMEPRAZOLE MAGNESIUM 40 MG PO CPDR: 40.0000 mg | DELAYED_RELEASE_CAPSULE | Freq: Every day | ORAL | Status: DC

## 2013-11-02 NOTE — Patient Instructions (Addendum)
We have sent the following medications to your pharmacy for you to pick up at your convenience:  Nexium   You have been scheduled for a CT scan of the abdomen and pelvis at [facility] ([address]---this is in the same building as [facility]ural [facility]).   You are scheduled on 11/09/2013 at 9:00am. You should arrive at 8:15am for registration. Please follow the written instructions below on the day of your exam:  WARNING: IF YOU ARE ALLERGIC TO IODINE/X-RAY DYE, PLEASE NOTIFY RADIOLOGY IMMEDIATELY AT [phone]! YOU WILL BE GIVEN A 13 HOUR PREMEDICATION PREP.  Do not eat or drink anything after 5:00am (4 hours prior to your test)   You have been scheduled for an endoscopy with propofol. Please follow written instructions given to you at your visit today.  If you use inhalers (even only as needed), please bring them with you on the day of your procedure.  You have been given some information on gas

## 2013-11-02 NOTE — Progress Notes (Signed)
HISTORY OF PRESENT ILLNESS:  Gerald [name] is a 57 y.o. male with prior negative colonoscopy in 2007, postinfectious dysmotility complaints 2012, and most recent evaluation in March 2013 regarding nausea, bloating, and borborygmi. He also has GERD for which he takes PPI. At the time of his last visit, testing for celiac disease was negative. Gastric emptying scan was performed and returned normal. Empiric probiotic provided. The patient's chief complaint today is 2-3 month history of postprandial abdominal pain in the epigastric region. Generally occurs after each meal and lasts 15-30 minutes. He mentions that his food seems to have difficulty going down and may hang. Not clearly describing esophageal dysphagia. He describes significant postprandial bloating discomfort. He also reports a 10 pound weight gain over the past year. He is not certain why he has had weight gain. He denies any change in his bowel habits, reporting 2-3 movements each morning. No fevers or bleeding. No constitutional or systemic symptoms. Despite what sounds like similar dyspeptic complaints in the past, he states that things are different. Particularly as it relates to the pain. He does have occasional reflux symptoms, but for the most part symptoms are controlled with Nexium 40 mg daily. He also has been using intermittent ranitidine. His weight and 2013 was 184 pounds. Today, 195 pounds. He did have a negative CT scan in 2011. Review of blood work from 10/11/2013 reveals unremarkable or normal comprehensive metabolic panel and CBC.  REVIEW OF SYSTEMS:  All non-GI ROS negative except for anxiety  Past Medical History  Diagnosis Date  . Duodenal ulcer   . GERD (gastroesophageal reflux disease)   . PUD (peptic ulcer disease)   . Substance abuse     TOBACCO  . Spondylosis   . Scoliosis   . Smoker   . HH (hiatus hernia)   . Leg length discrepancy   . Hyperlipemia     Past Surgical History  Procedure Laterality Date   . Knee arthroscopy      Social History Gerald [name]  reports that he has quit smoking. His smoking use included Cigarettes. He smoked 0.00 packs per day. He has never used smokeless tobacco. He reports that he drinks alcohol. He reports that he does not use illicit drugs.  family history includes Arthritis in his mother; Colon polyps in his mother; Stroke in his father. There is no history of Colon cancer.  No Known Allergies     PHYSICAL EXAMINATION: Vital signs: BP 118/70  Pulse 100  Ht 6' (1.829 m)  Wt 195 lb 6.4 oz (88.633 kg)  BMI 26.50 kg/m2 General: Well-developed, well-nourished, no acute distress HEENT: Sclerae are anicteric, conjunctiva pink. Oral mucosa intact Lungs: Clear Heart: Regular Abdomen: soft, mild tenderness in the epigastric region with palpation, nondistended, no obvious ascites, no peritoneal signs, normal bowel sounds. No organomegaly. Extremities: No edema Psychiatric: alert and oriented x3. Cooperative   ASSESSMENT:  #1. Two to three month history of postprandial abdominal pain and the sensation that food is not going down or getting hung. Etiology unclear. Also complaints of bloating and increased intestinal gas. Symptoms despite PPI. No alarm features such as vomiting, bleeding, anemia, or weight loss. Further workup warranted #2. GERD #3. Negative screening colonoscopy 2007 #4. Weight gain #5. Health related anxiety  PLAN:  #1. Contrast-enhanced CT scan of the abdomen and pelvis with fine cuts through the pancreas to evaluate pain #2. Diagnostic upper endoscopy to evaluate pain and possible dysphagia #3. Refill Nexium 40 mg daily, as requested #4.  Colonoscopy for repeat screening in 2017 #5. If the above workup negative, then provide reassurance and exercise/weight loss regimen

## 2013-11-03 ENCOUNTER — Other Ambulatory Visit: Payer: Self-pay | Admitting: Family Medicine

## 2013-11-04 HISTORY — PX: UPPER GASTROINTESTINAL ENDOSCOPY: SHX188

## 2013-11-09 ENCOUNTER — Ambulatory Visit (INDEPENDENT_AMBULATORY_CARE_PROVIDER_SITE_OTHER)
Admission: RE | Admit: 2013-11-09 | Discharge: 2013-11-09 | Disposition: A | Discharge status: Home / Self Care | Payer: BC Managed Care – PPO | Source: Ambulatory Visit | Attending: Internal Medicine | Admitting: Internal Medicine

## 2013-11-09 DIAGNOSIS — R109 Unspecified abdominal pain: Secondary | ICD-10-CM

## 2013-11-09 MED ORDER — IOHEXOL 350 MG/ML SOLN
100.0000 mL | Freq: Once | INTRAVENOUS | Status: AC | PRN
  Administered 2013-11-09: 100 mL via INTRAVENOUS

## 2013-11-11 ENCOUNTER — Encounter: Payer: Self-pay | Admitting: Internal Medicine

## 2013-11-11 ENCOUNTER — Ambulatory Visit (AMBULATORY_SURGERY_CENTER): Payer: BC Managed Care – PPO | Admitting: Internal Medicine

## 2013-11-11 VITALS — BP 118/74 | HR 84 | Temp 97.5°F | Resp 17 | Ht 72.0 in | Wt 195.0 lb

## 2013-11-11 DIAGNOSIS — R635 Abnormal weight gain: Secondary | ICD-10-CM

## 2013-11-11 DIAGNOSIS — K222 Esophageal obstruction: Secondary | ICD-10-CM

## 2013-11-11 DIAGNOSIS — K219 Gastro-esophageal reflux disease without esophagitis: Secondary | ICD-10-CM

## 2013-11-11 DIAGNOSIS — R198 Other specified symptoms and signs involving the digestive system and abdomen: Secondary | ICD-10-CM

## 2013-11-11 DIAGNOSIS — R109 Unspecified abdominal pain: Secondary | ICD-10-CM

## 2013-11-11 MED ORDER — SODIUM CHLORIDE 0.9 % IV SOLN: 500.0000 mL | INTRAVENOUS | Status: DC

## 2013-11-11 NOTE — Patient Instructions (Signed)

## 2013-11-11 NOTE — Progress Notes (Signed)
Lidocaine-40mg IV prior to Propofol InductionPropofol given over incremental dosages 

## 2013-11-11 NOTE — Op Note (Signed)
[facility]  [address] [place], [id number]   ENDOSCOPY PROCEDURE REPORT  PATIENT: [name], Gerald  MR#: [id number] BIRTHDATE: [birth year] , 57  yrs. old GENDER: Male ENDOSCOPIST: Eustace Quail, MD REFERRED BY:  .  Self / Office PROCEDURE DATE:  11/11/2013 PROCEDURE:  EGD, diagnostic ASA CLASS:     Class II INDICATIONS:  abdominal pain/bloating discomfort "food not going down". MEDICATIONS: MAC sedation, administered by CRNA and propofol (Diprivan) 300mg  IV TOPICAL ANESTHETIC: Cetacaine Spray DESCRIPTION OF PROCEDURE: After the risks benefits and alternatives of the procedure were thoroughly explained, informed consent was obtained.  The LB [id number] and LB [id number] endoscope was introduced through the mouth and advanced to the second portion of the duodenum. Without limitations.  The instrument was slowly withdrawn as the mucosa was fully examined.    EXAM:The esophagus revealed a ringlike large-caliber distal stricture.  Otherwise normal.  The stomach revealed a moderate hiatal hernia, but was otherwise normal.  The duodenum was normal. The esophagus revealed a ringlike large-caliber distal stricture. Otherwise normal.  The stomach revealed a moderate hiatal hernia, but was otherwise normal.  The duodenum was normal.  Retroflexed views revealed a hiatal hernia.     The scope was then withdrawn from the patient and the procedure completed.  COMPLICATIONS: There were no complications. ENDOSCOPIC IMPRESSION: 1.  Incidental large-caliber distal esophageal stricture and have a hernia. Otherwise normal exam 2. GERD  RECOMMENDATIONS: 1.  Anti-reflux regimen to be followed 2.  Continue PPI 3.  Exercise and weight loss 4. Stop smoking 5. Office followup in 3 months  REPEAT EXAM:  eSigned:  Eustace Quail, MD 11/11/2013 10:03 AM   LO:[employer], MD and The Patient

## 2013-11-12 ENCOUNTER — Telehealth: Payer: Self-pay | Admitting: Internal Medicine

## 2013-11-12 ENCOUNTER — Telehealth: Payer: Self-pay

## 2013-11-12 DIAGNOSIS — R109 Unspecified abdominal pain: Secondary | ICD-10-CM

## 2013-11-12 MED ORDER — ESOMEPRAZOLE MAGNESIUM 40 MG PO CPDR: 40.0000 mg | DELAYED_RELEASE_CAPSULE | Freq: Every day | ORAL | Status: DC

## 2013-11-12 NOTE — Telephone Encounter (Signed)
Refilled Nexium 

## 2013-11-12 NOTE — Telephone Encounter (Signed)
  Follow up Call-  Call back number 11/11/2013  Post procedure Call Back phone  # [phone] hm  Permission to leave phone message Yes     Patient questions:  Do you have a fever, pain , or abdominal swelling? no Pain Score  0 *  Have you tolerated food without any problems? yes  Have you been able to return to your normal activities? yes  Do you have any questions about your discharge instructions: Diet   no Medications  no Follow up visit  no  Do you have questions or concerns about your Care? no  Actions: * If pain score is 4 or above: No action needed, pain <4.

## 2013-11-15 ENCOUNTER — Telehealth: Payer: Self-pay | Admitting: Internal Medicine

## 2013-11-17 MED ORDER — OMEPRAZOLE 20 MG PO CPDR: 20.0000 mg | DELAYED_RELEASE_CAPSULE | Freq: Every day | ORAL | Status: DC

## 2013-11-17 NOTE — Telephone Encounter (Signed)
Spoke with pt's wife regarding the expense of Nexium.  I sent 30 days of Omeprazole to his pharmacy and instructed him to try it.  If it works patient will call me back and I will send refills.  If not, we will try something different.  Patient's wife agreed

## 2013-12-07 ENCOUNTER — Other Ambulatory Visit: Payer: Self-pay | Admitting: Family Medicine

## 2013-12-14 ENCOUNTER — Ambulatory Visit: Payer: BC Managed Care – PPO | Admitting: Gastroenterology

## 2014-02-15 ENCOUNTER — Ambulatory Visit: Payer: BC Managed Care – PPO | Admitting: Internal Medicine

## 2014-04-01 ENCOUNTER — Other Ambulatory Visit: Payer: Self-pay | Admitting: Family Medicine

## 2014-04-04 ENCOUNTER — Ambulatory Visit (INDEPENDENT_AMBULATORY_CARE_PROVIDER_SITE_OTHER): Payer: BC Managed Care – PPO | Admitting: Family Medicine

## 2014-04-04 ENCOUNTER — Encounter: Payer: Self-pay | Admitting: Family Medicine

## 2014-04-04 ENCOUNTER — Ambulatory Visit
Admission: RE | Admit: 2014-04-04 | Discharge: 2014-04-04 | Disposition: A | Discharge status: Home / Self Care | Payer: BC Managed Care – PPO | Source: Ambulatory Visit | Attending: Family Medicine | Admitting: Family Medicine

## 2014-04-04 ENCOUNTER — Other Ambulatory Visit: Payer: Self-pay | Admitting: Family Medicine

## 2014-04-04 VITALS — BP 110/70 | HR 80 | Wt 188.0 lb

## 2014-04-04 DIAGNOSIS — M549 Dorsalgia, unspecified: Secondary | ICD-10-CM

## 2014-04-04 DIAGNOSIS — M546 Pain in thoracic spine: Secondary | ICD-10-CM

## 2014-04-04 DIAGNOSIS — F17200 Nicotine dependence, unspecified, uncomplicated: Secondary | ICD-10-CM

## 2014-04-04 NOTE — Progress Notes (Signed)
   Subjective:    Patient ID: Gerald [name], male    DOB: 10/28/1956, 58 y.o.   MRN: [id number]  HPI He has a several month history of right-sided mid back pain. He has no history of injury. Taking a deep breath and movement make more pain. He also notes increased discomfort when he plays golf. He also has noted some difficulty with frozen shoulder on the left. He is presently seeing an orthopedic surgeon and has been involved with physical therapy as well as having the joint injected. He does smoke and is strongly considering quitting.   Review of Systems     Objective:   Physical Exam Alert and in no distress. Lungs are clear to auscultation. Some pain on lateral and rotational motion of the mid back area. No tenderness palpation. Good motion of his back. X-ray of chest and thoracic spine was negative.       Assessment & Plan:  Mid back pain on right side - Plan: DG Chest 2 View, CANCELED: DG Thoracic Spine 2 View  Current smoker  the x-rays were all negative. He had x-rays after leaving the office. I left him a message concerning calling and possible followup.

## 2014-05-24 ENCOUNTER — Encounter: Payer: Self-pay | Admitting: Medical

## 2014-05-24 ENCOUNTER — Ambulatory Visit (INDEPENDENT_AMBULATORY_CARE_PROVIDER_SITE_OTHER): Payer: BC Managed Care – PPO | Admitting: Medical

## 2014-05-24 VITALS — BP 120/70 | HR 68 | Temp 98.0°F | Resp 14 | Wt 185.0 lb

## 2014-05-24 DIAGNOSIS — J342 Deviated nasal septum: Secondary | ICD-10-CM

## 2014-05-24 DIAGNOSIS — J3089 Other allergic rhinitis: Secondary | ICD-10-CM

## 2014-05-24 DIAGNOSIS — J3489 Other specified disorders of nose and nasal sinuses: Secondary | ICD-10-CM

## 2014-05-24 DIAGNOSIS — J0100 Acute maxillary sinusitis, unspecified: Secondary | ICD-10-CM

## 2014-05-24 MED ORDER — AMOXICILLIN 875 MG PO TABS: 875.0000 mg | ORAL_TABLET | Freq: Two times a day (BID) | ORAL | Status: DC

## 2014-05-24 MED ORDER — IPRATROPIUM BROMIDE 0.03 % NA SOLN: 1.0000 | Freq: Two times a day (BID) | NASAL | Status: DC

## 2014-05-24 NOTE — Progress Notes (Signed)
   Subjective:    Patient ID: Gerald [name], male    DOB: 13-Jul-1956, 58 y.o.   MRN: [id number]  HPI  Patient is presenting for sinus problems. He has had sinus issues and deviated septum for years, but is here today for several weeks of sinus pressure, sore  in the left side of the nose.  He reports sinus pressure, congestion, sinus headaches, intermittent cough that can be productive with thick white mucous, more recently he has also developed upper left side tooth discomfort. When he blows his nose, it is clear drainage. No fevers, sob, difficulty breathing, chest pain, throat pain, fatigue, no itchy watery eyes, no eye pain, ear pain, ear pressure. He has tried to use saline spray, Mucinex, nasal spray at times, Tylenol cold/sinus with only temporary relief. Of note, he does see a dentist regularly at least twice a year without any significant findings. He is attempting to stop smoking, currently <1/2ppd. He has also been to 2 ENT specialists. He broke his nose in the past and one ENT doctor recommended realigning septal deviation. No other recommendations were made. States that he has very few allergies. Never had issues with seasonal allergies. Does not take medications for this. No other aggravating or relieving factors.  Review of Systems As in subjective.    Objective:   Physical Exam  BP 120/70  Pulse 68  Temp(Src) 98 F (36.7 C) (Oral)  Resp 14  Wt 185 lb (83.915 kg)  General appearance: alert, no distress, WD/WN HEENT: normocephalic, sclerae anicteric, conjunctiva pink and moist, TMs flat, deviated septum toward the right concave appearance, nares patent but erythematous, there appears to be a small sore on the inside of the left nare, left side maxillary sinus tenderness, pharynx normal, tonsils unremarkable Oral cavity: MMM, no lesions Neck: supple, no lymphadenopathy, no thyromegaly, no masses Lungs: CTA bilaterally, no wheezes, rhonchi, or rales      Assessment & Plan:    Encounter Diagnoses  Name Primary?  . Acute maxillary sinusitis, recurrence not specified Yes  . Deviated septum   . Other allergic rhinitis   . Nasal sore    I reviewed his prior ENT he notes with visit from Dr. Lucia Gaskins.  We discussed symptoms, acute concerns as well as ongoing rhinitis issues.  Given amoxicillin oral for sinusitis, he has mupirocin ointment at home he will use the nasal sore 3 times a day for a week.  He will do daily nasal saline ongoing, and begin trial of one to 2 times daily ipratropium nasal spray.    Patient was seen in conjunction with PA student Jaynee Eagles, and I have also evaluated and examined patient, agree with student's notes, student supervised by me.

## 2014-07-06 ENCOUNTER — Other Ambulatory Visit: Payer: Self-pay | Admitting: Internal Medicine

## 2014-07-22 ENCOUNTER — Telehealth: Payer: Self-pay | Admitting: Family Medicine

## 2014-07-22 NOTE — Telephone Encounter (Signed)
Pt stopped by and dropped off a form that needs to be completed. He had a cpe 10/14/2013. Please call pt when ready.

## 2014-07-25 NOTE — Telephone Encounter (Signed)
DONE

## 2014-07-25 NOTE — Telephone Encounter (Signed)
Take care of this 

## 2014-09-13 DIAGNOSIS — J342 Deviated nasal septum: Secondary | ICD-10-CM

## 2014-09-13 HISTORY — DX: Deviated nasal septum: J34.2

## 2014-10-17 ENCOUNTER — Encounter: Payer: Self-pay | Admitting: Family Medicine

## 2014-10-17 ENCOUNTER — Ambulatory Visit (INDEPENDENT_AMBULATORY_CARE_PROVIDER_SITE_OTHER): Payer: BC Managed Care – PPO | Admitting: Family Medicine

## 2014-10-17 VITALS — BP 110/60 | HR 92 | Ht 72.0 in | Wt 183.0 lb

## 2014-10-17 DIAGNOSIS — Z0000 Encounter for general adult medical examination without abnormal findings: Secondary | ICD-10-CM

## 2014-10-17 DIAGNOSIS — K219 Gastro-esophageal reflux disease without esophagitis: Secondary | ICD-10-CM

## 2014-10-17 DIAGNOSIS — Z720 Tobacco use: Secondary | ICD-10-CM

## 2014-10-17 DIAGNOSIS — F17200 Nicotine dependence, unspecified, uncomplicated: Secondary | ICD-10-CM

## 2014-10-17 LAB — CBC WITH DIFFERENTIAL/PLATELET
Basophils Absolute: 0 10*3/uL (ref 0.0–0.1)
Basophils Relative: 0 % (ref 0–1)
Eosinophils Absolute: 0.1 10*3/uL (ref 0.0–0.7)
Eosinophils Relative: 2 % (ref 0–5)
HCT: 40.4 % (ref 39.0–52.0)
Hemoglobin: 13.6 g/dL (ref 13.0–17.0)
Lymphocytes Relative: 39 % (ref 12–46)
Lymphs Abs: 2.6 10*3/uL (ref 0.7–4.0)
MCH: 31.6 pg (ref 26.0–34.0)
MCHC: 33.7 g/dL (ref 30.0–36.0)
MCV: 93.7 fL (ref 78.0–100.0)
MPV: 8.5 fL — ABNORMAL LOW (ref 9.4–12.4)
Monocytes Absolute: 0.5 10*3/uL (ref 0.1–1.0)
Monocytes Relative: 8 % (ref 3–12)
Neutro Abs: 3.4 10*3/uL (ref 1.7–7.7)
Neutrophils Relative %: 51 % (ref 43–77)
Platelets: 401 10*3/uL — ABNORMAL HIGH (ref 150–400)
RBC: 4.31 MIL/uL (ref 4.22–5.81)
RDW: 14.7 % (ref 11.5–15.5)
WBC: 6.7 10*3/uL (ref 4.0–10.5)

## 2014-10-17 NOTE — Progress Notes (Signed)
Subjective:     Patient ID: Gerald [name], male   DOB: 1955-12-08, 59 y.o.   MRN: [id number]  HPI  Raheen Capili is a 58 year old male with a history of GERD, smoking, and low back pain who presents for a comprehensive physical exam.  Over the past year he has had some issues with his "frozen shoulder" which has resolved with intraarticular steroid injections and physical therapy.  He has been having issues with chronic sinus congestion and is followed by his ENT, Dr. Etheleen Sia who has diagnosed him with perforated septum.  He will be evaluated within the week for this ongoing issue.  His chronic reflux symptoms are well controlled with daily nexium, and he only notices exacerbations with fatty/spicy food.  He has been evaluated with upper endoscopy in the past and Barret's esophagitis was excluded.    He has used testosterone therapy in the past due to findings of low free testosterone, but did not notice any changes.  He does not experience low libido, weakness, or fatigue currently.  We discussed the risks and benefits of testosterone therapy.  FH:  His family history is remarkable for a stroke in his father at age 37 (his father had no major risk factors).  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  His wife golfs with him, too, and their relationship is good.  He has reduced from 1/2 PPD to 1/4 PPD, and he plans to continue his reduction due to his wife's pressuring.  Prevention: He refuses influenza and pneumonia vaccinations despite counseling.  He received a colonoscopy 1 year ago (normal).  He has had normal DRE x2 past years.  Review of Systems     Objective:   Physical Exam  BP 110/60 mmHg  Pulse 92  Ht 6' (1.829 m)  Wt 183 lb (83.008 kg)  BMI 24.81 kg/m2  SpO2 98%  General Appearance:    Alert, cooperative, no distress, appears stated age  Head:    Normocephalic, without obvious abnormality, atraumatic  Eyes:    PERRL, conjunctiva/corneas  clear, EOM's intact, fundi    benign, both eyes       Ears:    Normal TM's and external ear canals, both ears  Nose:   Nares normal, septum midline, mucosa normal, no drainage   or sinus tenderness  Throat:   Lips, mucosa, and tongue normal; teeth and gums normal  Neck:   Supple, symmetrical, trachea midline, no adenopathy;       thyroid:  No enlargement/tenderness/nodules; no carotid   bruit or JVD  Back:     Symmetric, no curvature, ROM normal, no CVA tenderness  Lungs:     Clear to auscultation bilaterally, respirations unlabored  Chest wall:    No tenderness or deformity  Heart:    Regular rate and rhythm, S1 and S2 normal, no murmur, rub   or gallop  Abdomen:     Soft, non-tender, bowel sounds active all four quadrants,    no masses, no organomegaly  Extremities:   Extremities normal, atraumatic, no cyanosis or edema  Pulses:   2+ and symmetric all extremities  Skin:   Skin color, texture, turgor normal, no rashes or lesions  Lymph nodes:   Cervical, supraclavicular, and axillary nodes normal      Assessment & Plan:     Gastroesophageal reflux disease, esophagitis presence not specified  Current smoker  Routine general medical examination at a health care facility - Plan: POCT  Urinalysis Dipstick, Lipid Panel, CBC with Differential, COMPLETE METABOLIC PANEL WITH GFR  We will assess him with routine laboratory work.  He will follow up with ENT for his chronic sinus issues.  His health maintenance is up to date.  He is not interested in testosterone level testing today. Again encouraged him to quit smoking. Discussed the use of medications however he is on such a low-useage that this would probably not be beneficial

## 2014-10-18 LAB — COMPLETE METABOLIC PANEL WITH GFR
ALT: 18 U/L (ref 0–53)
AST: 13 U/L (ref 0–37)
Albumin: 4.3 g/dL (ref 3.5–5.2)
Alkaline Phosphatase: 78 U/L (ref 39–117)
BUN: 13 mg/dL (ref 6–23)
CO2: 27 mEq/L (ref 19–32)
Calcium: 9.5 mg/dL (ref 8.4–10.5)
Chloride: 103 mEq/L (ref 96–112)
Creat: 0.89 mg/dL (ref 0.50–1.35)
GFR, Est African American: >89 mL/min
GFR, Est Non African American: >89 mL/min
Glucose, Bld: 94 mg/dL (ref 70–99)
Potassium: 4.6 mEq/L (ref 3.5–5.3)
Sodium: 137 mEq/L (ref 135–145)
Total Bilirubin: 0.5 mg/dL (ref 0.2–1.2)
Total Protein: 6.9 g/dL (ref 6.0–8.3)

## 2014-10-18 LAB — LIPID PANEL
Cholesterol: 197 mg/dL (ref 0–200)
HDL: 41 mg/dL (ref >39)
LDL Cholesterol: 111 mg/dL — ABNORMAL HIGH (ref 0–99)
Total CHOL/HDL Ratio: 4.8 Ratio
Triglycerides: 226 mg/dL — ABNORMAL HIGH (ref <150)
VLDL: 45 mg/dL — ABNORMAL HIGH (ref 0–40)

## 2014-12-06 ENCOUNTER — Telehealth: Payer: Self-pay | Admitting: Family Medicine

## 2014-12-06 NOTE — Telephone Encounter (Signed)
Pt wants a call back, said he was supposed to get a call back yesterday and sat at home all day waiting for a call back. Pt would not say specifically what that call was for. Please [phone] or [phone]. Thanks

## 2014-12-06 NOTE — Telephone Encounter (Signed)
This is the wrong patient. This patient isn't a patient at our office. Please make sure to verify name and DOB. Thanks!

## 2015-03-10 ENCOUNTER — Other Ambulatory Visit: Payer: Self-pay | Admitting: Internal Medicine

## 2015-05-13 ENCOUNTER — Other Ambulatory Visit: Payer: Self-pay | Admitting: Internal Medicine

## 2015-06-15 ENCOUNTER — Telehealth: Payer: Self-pay | Admitting: Family Medicine

## 2015-06-15 NOTE — Telephone Encounter (Signed)
Pt dropped off a Physician Screening form to be completed based on his last CPE info and pt would like for the completed form to be emailed to him at [email or web address]

## 2015-06-19 NOTE — Telephone Encounter (Signed)
Take care of this 

## 2015-08-30 ENCOUNTER — Ambulatory Visit (INDEPENDENT_AMBULATORY_CARE_PROVIDER_SITE_OTHER): Payer: BLUE CROSS/BLUE SHIELD | Admitting: Family Medicine

## 2015-08-30 ENCOUNTER — Encounter: Payer: Self-pay | Admitting: Family Medicine

## 2015-08-30 VITALS — BP 118/70 | HR 68 | Temp 97.9°F | Wt 192.4 lb

## 2015-08-30 DIAGNOSIS — J0100 Acute maxillary sinusitis, unspecified: Secondary | ICD-10-CM

## 2015-08-30 MED ORDER — AMOXICILLIN-POT CLAVULANATE 875-125 MG PO TABS: 1.0000 | ORAL_TABLET | Freq: Two times a day (BID) | ORAL | Status: DC

## 2015-08-30 MED ORDER — PREDNISONE 10 MG (48) PO TBPK: ORAL_TABLET | Freq: Every day | ORAL | Status: DC

## 2015-08-30 NOTE — Progress Notes (Signed)
   Subjective:    Patient ID: Gerald [name], male    DOB: 08/03/1956, 59 y.o.   MRN: [id number]  HPI He is here for consult concerning intermittent difficulty with left-sided nasal congestion, bleeding and crusting. This is worse over the last several weeks but no fever, chills, sore throat or earache. He also has difficulty with left upper tooth pain. He was seen by Dr. Janace Hoard in the past and placed on Levaquin. He is not sure whether it helped much. Review of his record indicates he is supposed to get a CT and he thinks he did get it but is not sure. He does smoke but is down to one cigarette every few weeks.   Review of Systems     Objective:   Physical Exam Alert and in no distress. Nasal mucosa is slightly red with tenderness especially over left maxillary sinus. Tympanic membranes and canals are normal. Pharyngeal area is normal. Neck is supple without adenopathy or thyromegaly. Cardiac exam shows a regular sinus rhythm without murmurs or gallops. Lungs are clear to auscultation.        Assessment & Plan:  Acute maxillary sinusitis, recurrence not specified - Plan: amoxicillin-clavulanate (AUGMENTIN) 875-125 MG tablet, predniSONE (STERAPRED UNI-PAK 48 TAB) 10 MG (48) TBPK tablet  I will treat him aggressively with the antibiotic and the steroid. Discussed possible side effects from this. We'll check on whether the CT scan was done or not. He is to call me in 2 weeks.

## 2015-08-31 ENCOUNTER — Telehealth: Payer: Self-pay

## 2015-08-31 ENCOUNTER — Encounter: Payer: Self-pay | Admitting: Family Medicine

## 2015-08-31 NOTE — Telephone Encounter (Signed)
Called Dr Janace Hoard office said that he had one in 2015 but she can only find the results. She is sending that over though and I told her to send whatever else she has related to it, if you need more I can call her back after you get the fax.

## 2015-09-01 ENCOUNTER — Encounter: Payer: Self-pay | Admitting: Internal Medicine

## 2015-09-14 ENCOUNTER — Telehealth: Payer: Self-pay | Admitting: Family Medicine

## 2015-09-14 NOTE — Telephone Encounter (Signed)
Lets get a CT of the sinuses. Unresolved sinusitis after Augmentin and steroids

## 2015-09-14 NOTE — Telephone Encounter (Signed)
Pt states after being on antibiotic for sinus there has not been any change in his sinuses. He continues to have a constant headache over his left eye.

## 2015-09-15 ENCOUNTER — Other Ambulatory Visit: Payer: Self-pay

## 2015-09-15 DIAGNOSIS — J0101 Acute recurrent maxillary sinusitis: Secondary | ICD-10-CM

## 2015-09-18 ENCOUNTER — Other Ambulatory Visit: Payer: Self-pay | Admitting: Internal Medicine

## 2015-09-22 ENCOUNTER — Ambulatory Visit
Admission: RE | Admit: 2015-09-22 | Discharge: 2015-09-22 | Disposition: A | Discharge status: Home / Self Care | Payer: BLUE CROSS/BLUE SHIELD | Source: Ambulatory Visit | Attending: Family Medicine | Admitting: Family Medicine

## 2015-09-22 DIAGNOSIS — J0101 Acute recurrent maxillary sinusitis: Secondary | ICD-10-CM

## 2015-10-07 ENCOUNTER — Emergency Department (HOSPITAL_COMMUNITY): Payer: BLUE CROSS/BLUE SHIELD

## 2015-10-07 ENCOUNTER — Encounter (HOSPITAL_COMMUNITY): Payer: Self-pay | Admitting: Emergency Medicine

## 2015-10-07 ENCOUNTER — Inpatient Hospital Stay (HOSPITAL_COMMUNITY)
Admission: EM | Admit: 2015-10-07 | Discharge: 2015-10-08 | DRG: 395 | Disposition: A | Discharge status: Home / Self Care | Payer: BLUE CROSS/BLUE SHIELD | Attending: General Surgery | Admitting: General Surgery

## 2015-10-07 DIAGNOSIS — R109 Unspecified abdominal pain: Secondary | ICD-10-CM | POA: Diagnosis present

## 2015-10-07 DIAGNOSIS — F17210 Nicotine dependence, cigarettes, uncomplicated: Secondary | ICD-10-CM | POA: Diagnosis present

## 2015-10-07 DIAGNOSIS — Z8719 Personal history of other diseases of the digestive system: Secondary | ICD-10-CM | POA: Diagnosis present

## 2015-10-07 DIAGNOSIS — Z8711 Personal history of peptic ulcer disease: Secondary | ICD-10-CM

## 2015-10-07 DIAGNOSIS — K219 Gastro-esophageal reflux disease without esophagitis: Secondary | ICD-10-CM | POA: Diagnosis present

## 2015-10-07 DIAGNOSIS — Z79899 Other long term (current) drug therapy: Secondary | ICD-10-CM

## 2015-10-07 DIAGNOSIS — K56609 Unspecified intestinal obstruction, unspecified as to partial versus complete obstruction: Secondary | ICD-10-CM

## 2015-10-07 DIAGNOSIS — K559 Vascular disorder of intestine, unspecified: Principal | ICD-10-CM | POA: Diagnosis present

## 2015-10-07 DIAGNOSIS — E785 Hyperlipidemia, unspecified: Secondary | ICD-10-CM | POA: Diagnosis present

## 2015-10-07 LAB — CBC WITH DIFFERENTIAL/PLATELET
Basophils Absolute: 0 10*3/uL (ref 0.0–0.1)
Basophils Relative: 0 %
Eosinophils Absolute: 0.1 10*3/uL (ref 0.0–0.7)
Eosinophils Relative: 1 %
HCT: 39.8 % (ref 39.0–52.0)
Hemoglobin: 13.6 g/dL (ref 13.0–17.0)
Lymphocytes Relative: 22 %
Lymphs Abs: 3.1 10*3/uL (ref 0.7–4.0)
MCH: 32.2 pg (ref 26.0–34.0)
MCHC: 34.2 g/dL (ref 30.0–36.0)
MCV: 94.3 fL (ref 78.0–100.0)
Monocytes Absolute: 0.7 10*3/uL (ref 0.1–1.0)
Monocytes Relative: 5 %
Neutro Abs: 10.3 10*3/uL — ABNORMAL HIGH (ref 1.7–7.7)
Neutrophils Relative %: 72 %
Platelets: 302 10*3/uL (ref 150–400)
RBC: 4.22 MIL/uL (ref 4.22–5.81)
RDW: 13.8 % (ref 11.5–15.5)
WBC: 14.3 10*3/uL — ABNORMAL HIGH (ref 4.0–10.5)

## 2015-10-07 LAB — COMPREHENSIVE METABOLIC PANEL
ALT: 26 U/L (ref 17–63)
AST: 27 U/L (ref 15–41)
Albumin: 4.2 g/dL (ref 3.5–5.0)
Alkaline Phosphatase: 80 U/L (ref 38–126)
Anion gap: 10 (ref 5–15)
BUN: 11 mg/dL (ref 6–20)
CO2: 25 mmol/L (ref 22–32)
Calcium: 9.7 mg/dL (ref 8.9–10.3)
Chloride: 102 mmol/L (ref 101–111)
Creatinine, Ser: 0.97 mg/dL (ref 0.61–1.24)
GFR calc Af Amer: >60 mL/min (ref >60)
GFR calc non Af Amer: >60 mL/min (ref >60)
Glucose, Bld: 138 mg/dL — ABNORMAL HIGH (ref 65–99)
Potassium: 4.2 mmol/L (ref 3.5–5.1)
Sodium: 137 mmol/L (ref 135–145)
Total Bilirubin: 1.3 mg/dL — ABNORMAL HIGH (ref 0.3–1.2)
Total Protein: 7.2 g/dL (ref 6.5–8.1)

## 2015-10-07 LAB — LIPASE, BLOOD: Lipase: 30 U/L (ref 11–51)

## 2015-10-07 LAB — I-STAT CG4 LACTIC ACID, ED
Lactic Acid, Venous: 1.27 mmol/L (ref 0.5–2.0)
Lactic Acid, Venous: 0.98 mmol/L (ref 0.5–2.0)

## 2015-10-07 MED ORDER — HYDROMORPHONE HCL 1 MG/ML IJ SOLN: 2.0000 mg | Freq: Once | INTRAMUSCULAR | Status: DC

## 2015-10-07 MED ORDER — ONDANSETRON HCL 4 MG/2ML IJ SOLN
4.0000 mg | Freq: Once | INTRAMUSCULAR | Status: AC
  Administered 2015-10-07: 4 mg via INTRAVENOUS
  Filled 2015-10-07: qty 2

## 2015-10-07 MED ORDER — HYDRALAZINE HCL 20 MG/ML IJ SOLN: 10.0000 mg | INTRAMUSCULAR | Status: DC | PRN

## 2015-10-07 MED ORDER — METHOCARBAMOL 500 MG PO TABS: 500.0000 mg | ORAL_TABLET | Freq: Four times a day (QID) | ORAL | Status: DC | PRN

## 2015-10-07 MED ORDER — PANTOPRAZOLE SODIUM 40 MG IV SOLR
40.0000 mg | Freq: Every day | INTRAVENOUS | Status: DC
  Administered 2015-10-08: 40 mg via INTRAVENOUS
  Filled 2015-10-07: qty 40

## 2015-10-07 MED ORDER — IOHEXOL 300 MG/ML  SOLN
80.0000 mL | Freq: Once | INTRAMUSCULAR | Status: AC | PRN
  Administered 2015-10-07: 80 mL via INTRAVENOUS

## 2015-10-07 MED ORDER — SODIUM CHLORIDE 0.9 % IV BOLUS (SEPSIS)
1000.0000 mL | Freq: Once | INTRAVENOUS | Status: AC
  Administered 2015-10-07: 1000 mL via INTRAVENOUS

## 2015-10-07 MED ORDER — CEFOTETAN DISODIUM 2 G IJ SOLR
2.0000 g | Freq: Two times a day (BID) | INTRAMUSCULAR | Status: DC
  Administered 2015-10-08 (×2): 2 g via INTRAVENOUS
  Filled 2015-10-07 (×3): qty 2

## 2015-10-07 MED ORDER — HYDROMORPHONE HCL 1 MG/ML IJ SOLN
1.0000 mg | Freq: Once | INTRAMUSCULAR | Status: AC
  Administered 2015-10-07: 1 mg via INTRAVENOUS
  Filled 2015-10-07: qty 1

## 2015-10-07 MED ORDER — HYDROMORPHONE HCL 1 MG/ML IJ SOLN: 1.0000 mg | INTRAMUSCULAR | Status: DC | PRN

## 2015-10-07 MED ORDER — WHITE PETROLATUM GEL
Status: AC
  Administered 2015-10-07: 0.2
  Filled 2015-10-07: qty 1

## 2015-10-07 MED ORDER — KCL IN DEXTROSE-NACL 20-5-0.9 MEQ/L-%-% IV SOLN
INTRAVENOUS | Status: DC
  Administered 2015-10-08: via INTRAVENOUS
  Filled 2015-10-07 (×6): qty 1000

## 2015-10-07 MED ORDER — ENOXAPARIN SODIUM 40 MG/0.4ML ~~LOC~~ SOLN
40.0000 mg | SUBCUTANEOUS | Status: DC
  Filled 2015-10-07: qty 0.4

## 2015-10-07 MED ORDER — ONDANSETRON HCL 4 MG/2ML IJ SOLN: 4.0000 mg | Freq: Four times a day (QID) | INTRAMUSCULAR | Status: DC | PRN

## 2015-10-07 MED ORDER — ONDANSETRON 4 MG PO TBDP: 4.0000 mg | ORAL_TABLET | Freq: Four times a day (QID) | ORAL | Status: DC | PRN

## 2015-10-07 NOTE — ED Notes (Signed)
The pt is more comfortable now as he returns from  Xray.  Alert oriented skin warm and dry.  Pain decreased.

## 2015-10-07 NOTE — ED Notes (Signed)
Pt sts severe abd pain with N/V starting around 1400; pt appears in severe pain into back

## 2015-10-07 NOTE — ED Notes (Signed)
Report called to rn on 6 n 

## 2015-10-07 NOTE — H&P (Signed)
Gerald [name] is an 59 y.o. male.   Chief Complaint: Abdominal pain HPI: Asked to see patient at request of Dr. Lita Mains for acute abdominal pain. Patient was in good health until earlier today while playing golf he developed sudden sharp mid abdominal pain with nausea and vomiting. The pain was severe location midabdomen no radiation with sudden onset. He underwent CT scanning the emergency room which shows a thickened loop of mid small bowel with inflammation and distal narrowing noted. Concern for small bowel obstruction partial versus mass lesion versus stricture noted. He feels better now. His pain is resolved with pain medication. He has passed gas today. His last bowel movement was yesterday. He has never had any sort of pain like this before in his abdomen. He does suffer from reflux.  Past Medical History  Diagnosis Date  . Duodenal ulcer   . GERD (gastroesophageal reflux disease)   . PUD (peptic ulcer disease)   . Substance abuse     TOBACCO  . Smoker   . HH (hiatus hernia)   . Leg length discrepancy   . Hyperlipemia   . Esophageal stricture   . Deviated septum 09/13/14    severe    Past Surgical History  Procedure Laterality Date  . Knee arthroscopy      Family History  Problem Relation Age of Onset  . Colon polyps Mother   . Arthritis Mother   . Stroke Father   . Colon cancer Neg Hx   . Esophageal cancer Neg Hx   . Rectal cancer Neg Hx   . Stomach cancer Neg Hx    Social History:  reports that he has been smoking Cigarettes.  He has been smoking about 0.25 packs per day. He has never used smokeless tobacco. He reports that he drinks alcohol. He reports that he does not use illicit drugs.  Allergies: No Known Allergies   (Not in a hospital admission)  Results for orders placed or performed during the hospital encounter of 10/07/15 (from the past 48 hour(s))  I-Stat CG4 Lactic Acid, ED     Status: None   Collection Time: 10/07/15  6:35 PM  Result Value Ref  Range   Lactic Acid, Venous 1.27 0.5 - 2.0 mmol/L  CBC with Differential     Status: Abnormal   Collection Time: 10/07/15  6:37 PM  Result Value Ref Range   WBC 14.3 (H) 4.0 - 10.5 K/uL   RBC 4.22 4.22 - 5.81 MIL/uL   Hemoglobin 13.6 13.0 - 17.0 g/dL   HCT 39.8 39.0 - 52.0 %   MCV 94.3 78.0 - 100.0 fL   MCH 32.2 26.0 - 34.0 pg   MCHC 34.2 30.0 - 36.0 g/dL   RDW 13.8 11.5 - 15.5 %   Platelets 302 150 - 400 K/uL   Neutrophils Relative % 72 %   Neutro Abs 10.3 (H) 1.7 - 7.7 K/uL   Lymphocytes Relative 22 %   Lymphs Abs 3.1 0.7 - 4.0 K/uL   Monocytes Relative 5 %   Monocytes Absolute 0.7 0.1 - 1.0 K/uL   Eosinophils Relative 1 %   Eosinophils Absolute 0.1 0.0 - 0.7 K/uL   Basophils Relative 0 %   Basophils Absolute 0.0 0.0 - 0.1 K/uL  Comprehensive metabolic panel     Status: Abnormal   Collection Time: 10/07/15  6:37 PM  Result Value Ref Range   Sodium 137 135 - 145 mmol/L   Potassium 4.2 3.5 - 5.1 mmol/L   Chloride  102 101 - 111 mmol/L   CO2 25 22 - 32 mmol/L   Glucose, Bld 138 (H) 65 - 99 mg/dL   BUN 11 6 - 20 mg/dL   Creatinine, Ser 0.97 0.61 - 1.24 mg/dL   Calcium 9.7 8.9 - 10.3 mg/dL   Total Protein 7.2 6.5 - 8.1 g/dL   Albumin 4.2 3.5 - 5.0 g/dL   AST 27 15 - 41 U/L   ALT 26 17 - 63 U/L   Alkaline Phosphatase 80 38 - 126 U/L   Total Bilirubin 1.3 (H) 0.3 - 1.2 mg/dL   GFR calc non Af Amer >60 >60 mL/min   GFR calc Af Amer >60 >60 mL/min    Comment: (NOTE) The eGFR has been calculated using the CKD EPI equation. This calculation has not been validated in all clinical situations. eGFR's persistently <60 mL/min signify possible Chronic Kidney Disease.    Anion gap 10 5 - 15  Lipase, blood     Status: None   Collection Time: 10/07/15  6:37 PM  Result Value Ref Range   Lipase 30 11 - 51 U/L  I-Stat CG4 Lactic Acid, ED     Status: None   Collection Time: 10/07/15  9:36 PM  Result Value Ref Range   Lactic Acid, Venous 0.98 0.5 - 2.0 mmol/L   Dg Abd 1  View  10/07/2015  CLINICAL DATA:  59 year old male with abdominal pain, nausea. EXAM: ABDOMEN - 1 VIEW COMPARISON:  CT dated 11/09/2013 FINDINGS: There is enlargement of the hepatic silhouette concerning for hepatic megaly. No radiopaque calculi identified. There is mild degenerative changes of the spine with the osseous structures are grossly unremarkable. The visualized lower chest are clear. IMPRESSION: No evidence of bowel obstruction. Enlarged liver silhouette. Electronically Signed   By: Anner Crete M.D.   On: 10/07/2015 19:30   Ct Abdomen Pelvis W Contrast  10/07/2015  CLINICAL DATA:  Severe abdominal pain with nausea and vomiting. Onset at 14:00. EXAM: CT ABDOMEN AND PELVIS WITH CONTRAST TECHNIQUE: Multidetector CT imaging of the abdomen and pelvis was performed using the standard protocol following bolus administration of intravenous contrast. CONTRAST:  40mL OMNIPAQUE IOHEXOL 300 MG/ML  SOLN COMPARISON:  11/09/2013 FINDINGS: There is abnormal dilatation of proximal small bowel. There is a loop of small bowel which is inflamed, with edema extending into the mesentery. The inflamed segment of bowel leads directly into a an enhancing focally narrowed segment which could represent mass or stricture. Beyond this mid abdominal small bowel loop, the distal third of the small bowel appears to be decompressed. There is no extraluminal air. No pneumatosis. No mass is evident. There is a small volume ascites collected dependently. There are normal appearances of the liver, gallbladder, pancreas, spleen, adrenals and kidneys. The abdominal aorta is normal in caliber with mild atherosclerotic calcification. Urinary bladder is unremarkable. No significant skeletal lesion is evident. Incidental findings include bilateral L5 spondylolysis with minimal spondylolisthesis at L5-S1. No significant abnormality is evident in the lower chest. IMPRESSION: 1. Abnormal loop of small bowel, with dilatation and adjacent  mesenteric edema. There is a caliber transition immediately distal to this. This may represent a partial small bowel obstruction due to mass or stricture. Small volume ascites. 2. No other acute findings are evident. 3. Bilateral L5 spondylolysis with minimal spondylolisthesis. Electronically Signed   By: Andreas Newport M.D.   On: 10/07/2015 21:24    Review of Systems  Constitutional: Positive for malaise/fatigue. Negative for chills.  Cardiovascular: Negative.  Gastrointestinal: Positive for nausea, vomiting and abdominal pain.  Skin: Negative.   Neurological: Negative.     Blood pressure 118/82, pulse 91, temperature 98 F (36.7 C), temperature source Oral, resp. rate 13, height 6' 0.5" (1.842 m), weight 84.369 kg (186 lb), SpO2 94 %. Physical Exam  Constitutional: He is oriented to person, place, and time. He appears well-developed and well-nourished.  HENT:  Head: Atraumatic.  Eyes: Pupils are equal, round, and reactive to light.  Neck: Neck supple.  Cardiovascular: Normal rate and regular rhythm.   Respiratory: Effort normal and breath sounds normal.  GI: Soft. He exhibits distension. There is no tenderness. There is no rebound and no guarding.  Musculoskeletal: Normal range of motion.  Neurological: He is alert and oriented to person, place, and time.  Skin: Skin is warm and dry.  Psychiatric: He has a normal mood and affect. His behavior is normal. Thought content normal.     Assessment/Plan Enteritis secondary to possible ischemic event less likely small bowel obstruction  Recommend admission for IV fluids, IV antibiotics and bowel rest. He has a clinically benign exam at this point but is received pain medication. CT scan reviewed which shows a thickened loop of bowel with distal narrowing. This could represent an ischemic stricture given his smoking history but since his symptoms were sudden in onset, stricture or less likely. Mass less likely as well given sudden onset  of symptoms today. No previous history of severe abdominal pain, blood in stool or nausea or vomiting. He feels better now but needs admission and reexamination in a.m. Will place on bowel rest and antibiotics.  Gerald [name] A. 10/07/2015, 10:16 PM

## 2015-10-07 NOTE — ED Provider Notes (Signed)
CSN: [id number]     Arrival date & time 10/07/15  1742 History   First MD Initiated Contact with Patient 10/07/15 1803     Chief Complaint  Patient presents with  . Abdominal Pain    Patient is a 59 y.o. male presenting with abdominal pain. The history is provided by the patient.  Abdominal Pain Pain location:  Generalized Pain quality: bloating   Pain radiates to:  Back Pain severity:  Severe Onset quality:  Sudden Duration:  5 hours Timing:  Constant Progression:  Worsening Chronicity:  New Context comment:  H/o PUD, takes NSAIDs Relieved by:  Nothing Worsened by:  Palpation Associated symptoms: nausea and vomiting   Associated symptoms: no chest pain, no chills, no constipation, no cough, no diarrhea, no dysuria, no fever, no hematuria, no shortness of breath and no sore throat   Risk factors: NSAID use     Past Medical History  Diagnosis Date  . Duodenal ulcer   . GERD (gastroesophageal reflux disease)   . PUD (peptic ulcer disease)   . Substance abuse     TOBACCO  . Smoker   . HH (hiatus hernia)   . Leg length discrepancy   . Hyperlipemia   . Esophageal stricture   . Deviated septum 09/13/14    severe   Past Surgical History  Procedure Laterality Date  . Knee arthroscopy     Family History  Problem Relation Age of Onset  . Colon polyps Mother   . Arthritis Mother   . Stroke Father   . Colon cancer Neg Hx   . Esophageal cancer Neg Hx   . Rectal cancer Neg Hx   . Stomach cancer Neg Hx    Social History  Substance Use Topics  . Smoking status: Current Every Day Smoker -- 0.25 packs/day    Types: Cigarettes  . Smokeless tobacco: Never Used     Comment: Smokes occ.Marland KitchenCounseling sheet given 01-2012   . Alcohol Use: 0.0 oz/week    0 Standard drinks or equivalent per week     Comment: 3-4 beers a week on weekends     Review of Systems  Constitutional: Negative for fever and chills.  HENT: Negative for rhinorrhea and sore throat.   Eyes: Negative for  visual disturbance.  Respiratory: Negative for cough and shortness of breath.   Cardiovascular: Negative for chest pain.  Gastrointestinal: Positive for nausea, vomiting and abdominal pain. Negative for diarrhea and constipation.  Genitourinary: Negative for dysuria and hematuria.  Musculoskeletal: Negative for back pain and neck pain.  Skin: Negative for rash.  Neurological: Negative for syncope and headaches.  Psychiatric/Behavioral: Negative for confusion.  All other systems reviewed and are negative.  Allergies  Review of patient's allergies indicates no known allergies.  Home Medications   Prior to Admission medications   Medication Sig Start Date End Date Taking? Authorizing Provider  esomeprazole (NEXIUM) 40 MG capsule TAKE 1 CAPSULE BY MOUTH DAILY AT NOON 09/18/15  Yes Irene Shipper, MD   BP 148/92 mmHg  Pulse 50  Temp(Src) 98 F (36.7 C) (Oral)  Resp 18  SpO2 96% Physical Exam  Constitutional: He is oriented to person, place, and time. He appears well-developed and well-nourished. He appears distressed.  Writhing on bed, guarding abdomen  HENT:  Head: Normocephalic and atraumatic.  Mouth/Throat: Oropharynx is clear and moist.  Eyes: EOM are normal.  Neck: Neck supple. No JVD present.  Cardiovascular: Normal rate, regular rhythm, normal heart sounds and intact distal pulses.  Pulmonary/Chest: Effort normal and breath sounds normal.  Abdominal: Soft. He exhibits no distension. There is generalized tenderness. There is guarding.  Musculoskeletal: Normal range of motion. He exhibits no edema.  Neurological: He is alert and oriented to person, place, and time. No cranial nerve deficit.  Skin: Skin is warm and dry.  Psychiatric: His behavior is normal.    ED Course  Procedures  None  Labs Review Labs Reviewed  CBC WITH DIFFERENTIAL/PLATELET - Abnormal; Notable for the following:    WBC 14.3 (*)    Neutro Abs 10.3 (*)    All other components within normal limits   COMPREHENSIVE METABOLIC PANEL - Abnormal; Notable for the following:    Glucose, Bld 138 (*)    Total Bilirubin 1.3 (*)    All other components within normal limits  LIPASE, BLOOD  CBC  COMPREHENSIVE METABOLIC PANEL  I-STAT CG4 LACTIC ACID, ED  I-STAT CG4 LACTIC ACID, ED   Imaging Review Dg Abd 1 View  10/07/2015  CLINICAL DATA:  59 year old male with abdominal pain, nausea. EXAM: ABDOMEN - 1 VIEW COMPARISON:  CT dated 11/09/2013 FINDINGS: There is enlargement of the hepatic silhouette concerning for hepatic megaly. No radiopaque calculi identified. There is mild degenerative changes of the spine with the osseous structures are grossly unremarkable. The visualized lower chest are clear. IMPRESSION: No evidence of bowel obstruction. Enlarged liver silhouette. Electronically Signed   By: Anner Crete M.D.   On: 10/07/2015 19:30   Ct Abdomen Pelvis W Contrast  10/07/2015  CLINICAL DATA:  Severe abdominal pain with nausea and vomiting. Onset at 14:00. EXAM: CT ABDOMEN AND PELVIS WITH CONTRAST TECHNIQUE: Multidetector CT imaging of the abdomen and pelvis was performed using the standard protocol following bolus administration of intravenous contrast. CONTRAST:  93mL OMNIPAQUE IOHEXOL 300 MG/ML  SOLN COMPARISON:  11/09/2013 FINDINGS: There is abnormal dilatation of proximal small bowel. There is a loop of small bowel which is inflamed, with edema extending into the mesentery. The inflamed segment of bowel leads directly into a an enhancing focally narrowed segment which could represent mass or stricture. Beyond this mid abdominal small bowel loop, the distal third of the small bowel appears to be decompressed. There is no extraluminal air. No pneumatosis. No mass is evident. There is a small volume ascites collected dependently. There are normal appearances of the liver, gallbladder, pancreas, spleen, adrenals and kidneys. The abdominal aorta is normal in caliber with mild atherosclerotic  calcification. Urinary bladder is unremarkable. No significant skeletal lesion is evident. Incidental findings include bilateral L5 spondylolysis with minimal spondylolisthesis at L5-S1. No significant abnormality is evident in the lower chest. IMPRESSION: 1. Abnormal loop of small bowel, with dilatation and adjacent mesenteric edema. There is a caliber transition immediately distal to this. This may represent a partial small bowel obstruction due to mass or stricture. Small volume ascites. 2. No other acute findings are evident. 3. Bilateral L5 spondylolysis with minimal spondylolisthesis. Electronically Signed   By: Andreas Newport M.D.   On: 10/07/2015 21:24    I have personally reviewed and evaluated these images and lab results as part of my medical decision-making.  MDM   Final diagnoses:  SBO (small bowel obstruction) ([place])   59 yo M with a PMH of GERD and PUD presents with acute abdominal pain. TTP on exam with guarding. Bedside aorta US shows a normal caliber aorta without free abdominal fluid. Upright KUB neg for free air. CT shows partial SBO with mesenteric edema and possible mass. General  surgery consulted. Will admit to surgery for further management of SBO. HDS. Pain improved.   Discussed with Dr. Lita Mains.   Gustavus Bryant, MD 10/08/15 0030  Julianne Rice, MD 10/09/15 [phone]

## 2015-10-07 NOTE — ED Notes (Signed)
The pt is being  Ad\mitted   Waiting for orders

## 2015-10-07 NOTE — ED Notes (Signed)
Dr Brantley Stage here to see

## 2015-10-08 ENCOUNTER — Inpatient Hospital Stay (HOSPITAL_COMMUNITY): Payer: BLUE CROSS/BLUE SHIELD

## 2015-10-08 LAB — CBC
HCT: 36 % — ABNORMAL LOW (ref 39.0–52.0)
Hemoglobin: 12.2 g/dL — ABNORMAL LOW (ref 13.0–17.0)
MCH: 32.3 pg (ref 26.0–34.0)
MCHC: 33.9 g/dL (ref 30.0–36.0)
MCV: 95.2 fL (ref 78.0–100.0)
Platelets: 260 10*3/uL (ref 150–400)
RBC: 3.78 MIL/uL — ABNORMAL LOW (ref 4.22–5.81)
RDW: 14.1 % (ref 11.5–15.5)
WBC: 8.4 10*3/uL (ref 4.0–10.5)

## 2015-10-08 LAB — COMPREHENSIVE METABOLIC PANEL
ALT: 19 U/L (ref 17–63)
AST: 16 U/L (ref 15–41)
Albumin: 3.2 g/dL — ABNORMAL LOW (ref 3.5–5.0)
Alkaline Phosphatase: 68 U/L (ref 38–126)
Anion gap: 7 (ref 5–15)
BUN: 8 mg/dL (ref 6–20)
CO2: 26 mmol/L (ref 22–32)
Calcium: 8.7 mg/dL — ABNORMAL LOW (ref 8.9–10.3)
Chloride: 107 mmol/L (ref 101–111)
Creatinine, Ser: 0.98 mg/dL (ref 0.61–1.24)
GFR calc Af Amer: >60 mL/min (ref >60)
GFR calc non Af Amer: >60 mL/min (ref >60)
Glucose, Bld: 124 mg/dL — ABNORMAL HIGH (ref 65–99)
Potassium: 3.7 mmol/L (ref 3.5–5.1)
Sodium: 140 mmol/L (ref 135–145)
Total Bilirubin: 0.6 mg/dL (ref 0.3–1.2)
Total Protein: 6 g/dL — ABNORMAL LOW (ref 6.5–8.1)

## 2015-10-08 NOTE — Progress Notes (Signed)
Patient ID: Gerald [name], male   DOB: February 22, 1956, 59 y.o.   MRN: [id number]     [facility]      [address] [place], [place] [address]    Phone: [phone] FAX: [phone]     Subjective: Pain resolved.  No n/v.  Passing flatus.  Afebrile.  WBC normalized.   Objective:  Vital signs:  Filed Vitals:   10/07/15 2230 10/07/15 2300 10/07/15 2335 10/08/15 0510  BP: 114/80 114/78 118/74 118/67  Pulse: 90 91 94 92  Temp:   98.5 F (36.9 C) 98.3 F (36.8 C)  TempSrc:   Oral   Resp: _0 Height:   6' 0.5" (1.842 m)   Weight:   84.369 kg (186 lb)   SpO2: 93% 88% 95% 93%       Intake/Output   Yesterday:  12/03 0701 - 12/04 0700 In: 564.6 [I.V.:564.6] Out: -  This shift:    I/O last 3 completed shifts: In: 564.6 [I.V.:564.6] Out: -     Physical Exam: General: Pt awake/alert/oriented x4 in no acute distress Chest: cta. No chest wall pain w good excursion CV:  Pulses intact.  Regular rhythm Abdomen: Soft.  Nondistended.  Non tender.  No evidence of peritonitis.  No incarcerated hernias. Ext:  SCDs BLE.  No mjr edema.  No cyanosis Skin: No petechiae / purpura   Problem List:   Active Problems:   Ischemic enteritis [facility])    Results:   Labs: Results for orders placed or performed during the hospital encounter of 10/07/15 (from the past 48 hour(s))  I-Stat CG4 Lactic Acid, ED     Status: None   Collection Time: 10/07/15  6:35 PM  Result Value Ref Range   Lactic Acid, Venous 1.27 0.5 - 2.0 mmol/L  CBC with Differential     Status: Abnormal   Collection Time: 10/07/15  6:37 PM  Result Value Ref Range   WBC 14.3 (H) 4.0 - 10.5 K/uL   RBC 4.22 4.22 - 5.81 MIL/uL   Hemoglobin 13.6 13.0 - 17.0 g/dL   HCT 39.8 39.0 - 52.0 %   MCV 94.3 78.0 - 100.0 fL   MCH 32.2 26.0 - 34.0 pg   MCHC 34.2 30.0 - 36.0 g/dL   RDW 13.8 11.5 - 15.5 %   Platelets 302 150 - 400 K/uL   Neutrophils Relative % 72 %   Neutro  Abs 10.3 (H) 1.7 - 7.7 K/uL   Lymphocytes Relative 22 %   Lymphs Abs 3.1 0.7 - 4.0 K/uL   Monocytes Relative 5 %   Monocytes Absolute 0.7 0.1 - 1.0 K/uL   Eosinophils Relative 1 %   Eosinophils Absolute 0.1 0.0 - 0.7 K/uL   Basophils Relative 0 %   Basophils Absolute 0.0 0.0 - 0.1 K/uL  Comprehensive metabolic panel     Status: Abnormal   Collection Time: 10/07/15  6:37 PM  Result Value Ref Range   Sodium 137 135 - 145 mmol/L   Potassium 4.2 3.5 - 5.1 mmol/L   Chloride 102 101 - 111 mmol/L   CO2 25 22 - 32 mmol/L   Glucose, Bld 138 (H) 65 - 99 mg/dL   BUN 11 6 - 20 mg/dL   Creatinine, Ser 0.97 0.61 - 1.24 mg/dL   Calcium 9.7 8.9 - 10.3 mg/dL   Total Protein 7.2 6.5 - 8.1 g/dL   Albumin 4.2 3.5 - 5.0 g/dL   AST 27  15 - 41 U/L   ALT 26 17 - 63 U/L   Alkaline Phosphatase 80 38 - 126 U/L   Total Bilirubin 1.3 (H) 0.3 - 1.2 mg/dL   GFR calc non Af Amer >60 >60 mL/min   GFR calc Af Amer >60 >60 mL/min    Comment: (NOTE) The eGFR has been calculated using the CKD EPI equation. This calculation has not been validated in all clinical situations. eGFR's persistently <60 mL/min signify possible Chronic Kidney Disease.    Anion gap 10 5 - 15  Lipase, blood     Status: None   Collection Time: 10/07/15  6:37 PM  Result Value Ref Range   Lipase 30 11 - 51 U/L  I-Stat CG4 Lactic Acid, ED     Status: None   Collection Time: 10/07/15  9:36 PM  Result Value Ref Range   Lactic Acid, Venous 0.98 0.5 - 2.0 mmol/L  CBC     Status: Abnormal   Collection Time: 10/08/15  5:37 AM  Result Value Ref Range   WBC 8.4 4.0 - 10.5 K/uL   RBC 3.78 (L) 4.22 - 5.81 MIL/uL   Hemoglobin 12.2 (L) 13.0 - 17.0 g/dL   HCT 36.0 (L) 39.0 - 52.0 %   MCV 95.2 78.0 - 100.0 fL   MCH 32.3 26.0 - 34.0 pg   MCHC 33.9 30.0 - 36.0 g/dL   RDW 14.1 11.5 - 15.5 %   Platelets 260 150 - 400 K/uL  Comprehensive metabolic panel     Status: Abnormal   Collection Time: 10/08/15  5:37 AM  Result Value Ref Range   Sodium  140 135 - 145 mmol/L   Potassium 3.7 3.5 - 5.1 mmol/L   Chloride 107 101 - 111 mmol/L   CO2 26 22 - 32 mmol/L   Glucose, Bld 124 (H) 65 - 99 mg/dL   BUN 8 6 - 20 mg/dL   Creatinine, Ser 0.98 0.61 - 1.24 mg/dL   Calcium 8.7 (L) 8.9 - 10.3 mg/dL   Total Protein 6.0 (L) 6.5 - 8.1 g/dL   Albumin 3.2 (L) 3.5 - 5.0 g/dL   AST 16 15 - 41 U/L   ALT 19 17 - 63 U/L   Alkaline Phosphatase 68 38 - 126 U/L   Total Bilirubin 0.6 0.3 - 1.2 mg/dL   GFR calc non Af Amer >60 >60 mL/min   GFR calc Af Amer >60 >60 mL/min    Comment: (NOTE) The eGFR has been calculated using the CKD EPI equation. This calculation has not been validated in all clinical situations. eGFR's persistently <60 mL/min signify possible Chronic Kidney Disease.    Anion gap 7 5 - 15    Imaging / Studies: Dg Abd 1 View  10/07/2015  CLINICAL DATA:  59 year old male with abdominal pain, nausea. EXAM: ABDOMEN - 1 VIEW COMPARISON:  CT dated 11/09/2013 FINDINGS: There is enlargement of the hepatic silhouette concerning for hepatic megaly. No radiopaque calculi identified. There is mild degenerative changes of the spine with the osseous structures are grossly unremarkable. The visualized lower chest are clear. IMPRESSION: No evidence of bowel obstruction. Enlarged liver silhouette. Electronically Signed   By: Anner Crete M.D.   On: 10/07/2015 19:30   Ct Abdomen Pelvis W Contrast  10/07/2015  CLINICAL DATA:  Severe abdominal pain with nausea and vomiting. Onset at 14:00. EXAM: CT ABDOMEN AND PELVIS WITH CONTRAST TECHNIQUE: Multidetector CT imaging of the abdomen and pelvis was performed using the standard protocol following bolus  administration of intravenous contrast. CONTRAST:  80mL OMNIPAQUE IOHEXOL 300 MG/ML  SOLN COMPARISON:  11/09/2013 FINDINGS: There is abnormal dilatation of proximal small bowel. There is a loop of small bowel which is inflamed, with edema extending into the mesentery. The inflamed segment of bowel leads directly  into a an enhancing focally narrowed segment which could represent mass or stricture. Beyond this mid abdominal small bowel loop, the distal third of the small bowel appears to be decompressed. There is no extraluminal air. No pneumatosis. No mass is evident. There is a small volume ascites collected dependently. There are normal appearances of the liver, gallbladder, pancreas, spleen, adrenals and kidneys. The abdominal aorta is normal in caliber with mild atherosclerotic calcification. Urinary bladder is unremarkable. No significant skeletal lesion is evident. Incidental findings include bilateral L5 spondylolysis with minimal spondylolisthesis at L5-S1. No significant abnormality is evident in the lower chest. IMPRESSION: 1. Abnormal loop of small bowel, with dilatation and adjacent mesenteric edema. There is a caliber transition immediately distal to this. This may represent a partial small bowel obstruction due to mass or stricture. Small volume ascites. 2. No other acute findings are evident. 3. Bilateral L5 spondylolysis with minimal spondylolisthesis. Electronically Signed   By: Daniel R Mitchell M.D.   On: 10/07/2015 21:24    Medications / Allergies:  Scheduled Meds: . cefoTEtan (CEFOTAN) IV  2 g Intravenous Q12H  . enoxaparin (LOVENOX) injection  40 mg Subcutaneous Q24H  . pantoprazole (PROTONIX) IV  40 mg Intravenous QHS   Continuous Infusions: . dextrose 5 % and 0.9 % NaCl with KCl 20 mEq/L 125 mL/hr at 10/08/15 0009   PRN Meds:.hydrALAZINE, HYDROmorphone (DILAUDID) injection, methocarbamol, ondansetron **OR** ondansetron (ZOFRAN) IV  Antibiotics: Anti-infectives    Start     Dose/Rate Route Frequency Ordered Stop   10/07/15 2230  cefoTEtan (CEFOTAN) 2 g in dextrose 5 % 50 mL IVPB     2 g 100 mL/hr over 30 Minutes Intravenous Every 12 hours 10/07/15 2216          Assessment/Plan Ischemic enteritis-resolved, clears CT questions stricture v mass, he had a colonoscopy by Dr. Perry  about 3-4 years ago.  FEN-advance diet as tolerated ID-cefotetan VTE prophylaxis-SCD/lovenox Dispo-possibly in AM   Emina Riebock, ANP-BC [facility]   10/08/2015 7:39 AM    

## 2015-10-08 NOTE — Progress Notes (Signed)
Utilization Review Completed.Gerald [name] T12/02/2015  

## 2015-10-08 NOTE — Discharge Summary (Signed)
Physician Discharge Summary  Gerald [name] [id number] DOB: 23-Oct-1956 DOA: 10/07/2015  PCP: Gerald Haste, MD  Consultation: none  Admit date: 10/07/2015 Discharge date: 10/08/2015  Recommendations for Outpatient Follow-up:    Follow-up Information    Follow up with Gerald Haste, MD.   Specialty:  Family Medicine   Contact information:   [address] [place] [address] [phone]      Discharge Diagnoses:  1. Ischemic enteritis   Surgical Procedure: none  Discharge Condition: stable Disposition: home  Diet recommendation: regular  Filed Weights   10/07/15 1851 10/07/15 2335  Weight: 84.369 kg (186 lb) 84.369 kg (186 lb)       Hospital Course:  Gerald [name] is a 59 year old male who presented to [facility] with sudden onset abdominal pain.  CT showed abnormal loop of small bowel, mesenteric edema, PSBO possibly due to mass or stricture.  Symptoms quickly resolved with bowel rest.  He was discharged home.  He was encouraged to follow up with his PCP.  Should symptoms persist, may need GI referral for endoscopy/capsule endoscopy.  He has seen Dr. Henrene Pastor and had a colonoscopy about 4 years ago.  He was encouraged to call with questions or concerns.  No formal follow up as no surgical problems identified.    Discharge Instructions     Medication List    TAKE these medications        esomeprazole 40 MG capsule  Commonly known as:  [place] 1 CAPSULE BY MOUTH DAILY AT NOON           Follow-up Information    Follow up with Gerald Haste, MD.   Specialty:  Family Medicine   Contact information:   [name] [place] [address] [phone]        The results of significant diagnostics from this hospitalization (including imaging, microbiology, ancillary and laboratory) are listed below for reference.    Significant Diagnostic Studies: Dg Abd 1 View  10/08/2015  CLINICAL DATA:  Abdominal pain.  EXAM: ABDOMEN - 1 VIEW COMPARISON:  10/07/2015 CT abdomen/pelvis. FINDINGS: There is a mildly dilated small bowel loop in the left abdomen, which demonstrates prominent worsening fold thickening. No additional dilated small bowel loops no evidence of pneumatosis or pneumoperitoneum. Minimal stool throughout the colon no pathologic soft tissue calcifications. Lung bases appear clear. IMPRESSION: Worsening prominent fold thickening throughout the mildly dilated small bowel loop in the left abdomen, differential includes worsening infectious or inflammatory enteritis versus small bowel ischemia. No evidence of pneumatosis or pneumoperitoneum. Electronically Signed   By: Gerald [name] M.D.   On: 10/08/2015 09:10   Dg Abd 1 View  10/07/2015  CLINICAL DATA:  59 year old male with abdominal pain, nausea. EXAM: ABDOMEN - 1 VIEW COMPARISON:  CT dated 11/09/2013 FINDINGS: There is enlargement of the hepatic silhouette concerning for hepatic megaly. No radiopaque calculi identified. There is mild degenerative changes of the spine with the osseous structures are grossly unremarkable. The visualized lower chest are clear. IMPRESSION: No evidence of bowel obstruction. Enlarged liver silhouette. Electronically Signed   By: Gerald [name] M.D.   On: 10/07/2015 19:30   Ct Abdomen Pelvis W Contrast  10/07/2015  CLINICAL DATA:  Severe abdominal pain with nausea and vomiting. Onset at 14:00. EXAM: CT ABDOMEN AND PELVIS WITH CONTRAST TECHNIQUE: Multidetector CT imaging of the abdomen and pelvis was performed using the standard protocol following bolus administration of intravenous contrast. CONTRAST:  63mL OMNIPAQUE IOHEXOL 300 MG/ML  SOLN COMPARISON:  11/09/2013 FINDINGS: There is abnormal dilatation of proximal small bowel. There is a loop of small bowel which is inflamed, with edema extending into the mesentery. The inflamed segment of bowel leads directly into a an enhancing focally narrowed segment which could represent mass  or stricture. Beyond this mid abdominal small bowel loop, the distal third of the small bowel appears to be decompressed. There is no extraluminal air. No pneumatosis. No mass is evident. There is a small volume ascites collected dependently. There are normal appearances of the liver, gallbladder, pancreas, spleen, adrenals and kidneys. The abdominal aorta is normal in caliber with mild atherosclerotic calcification. Urinary bladder is unremarkable. No significant skeletal lesion is evident. Incidental findings include bilateral L5 spondylolysis with minimal spondylolisthesis at L5-S1. No significant abnormality is evident in the lower chest. IMPRESSION: 1. Abnormal loop of small bowel, with dilatation and adjacent mesenteric edema. There is a caliber transition immediately distal to this. This may represent a partial small bowel obstruction due to mass or stricture. Small volume ascites. 2. No other acute findings are evident. 3. Bilateral L5 spondylolysis with minimal spondylolisthesis. Electronically Signed   By: Gerald [name] M.D.   On: 10/07/2015 21:24   Ct Maxillofacial Wo Cm  09/22/2015  CLINICAL DATA:  Recurrent sinusitis. Headaches and facial pressure. Nasal drainage. EXAM: CT MAXILLOFACIAL WITHOUT CONTRAST TECHNIQUE: Multidetector CT imaging of the maxillofacial structures was performed. Multiplanar CT image reconstructions were also generated. A small metallic BB was placed on the right temple in order to reliably differentiate right from left. COMPARISON:  None. FINDINGS: The paranasal sinuses are completely clear. Slight chronic nasal septal deviation from left-to-right. The turbinates are not edematous. Posterior nasopharyngeal soft tissues are normal. The visualized intracranial structures are normal. Orbits are normal. The visualized parotid glands are normal. No adenopathy. IMPRESSION: Normal paranasal sinuses. Electronically Signed   By: Lorriane Shire M.D.   On: 09/22/2015 15:30     Microbiology: No results found for this or any previous visit (from the past 240 hour(s)).   Labs: Basic Metabolic Panel:  Recent Labs Lab 10/07/15 1837 10/08/15 0537  NA 137 140  K 4.2 3.7  CL 102 107  CO2 25 26  GLUCOSE 138* 124*  BUN 11 8  CREATININE 0.97 0.98  CALCIUM 9.7 8.7*   Liver Function Tests:  Recent Labs Lab 10/07/15 1837 10/08/15 0537  AST 27 16  ALT 26 19  ALKPHOS 80 68  BILITOT 1.3* 0.6  PROT 7.2 6.0*  ALBUMIN 4.2 3.2*    Recent Labs Lab 10/07/15 1837  LIPASE 30   No results for input(s): AMMONIA in the last 168 hours. CBC:  Recent Labs Lab 10/07/15 1837 10/08/15 0537  WBC 14.3* 8.4  NEUTROABS 10.3*  --   HGB 13.6 12.2*  HCT 39.8 36.0*  MCV 94.3 95.2  PLT 302 260   Cardiac Enzymes: No results for input(s): CKTOTAL, CKMB, CKMBINDEX, TROPONINI in the last 168 hours. BNP: BNP (last 3 results) No results for input(s): BNP in the last 8760 hours.  ProBNP (last 3 results) No results for input(s): PROBNP in the last 8760 hours.  CBG: No results for input(s): GLUCAP in the last 168 hours.  Active Problems:   Ischemic enteritis ([place])   Time coordinating discharge: <30 mins   Signed:  Yomar Mejorado, ANP-BC

## 2015-10-08 NOTE — Progress Notes (Signed)
Pt discharged to home.  Discharge instructions explained to pt.  Pt has no questions at the time of discharge.  Pt states he has all belongings.  IV removed.  Pt ambulated off unit on his own.   

## 2015-10-09 ENCOUNTER — Telehealth: Payer: Self-pay | Admitting: Medical

## 2015-10-09 ENCOUNTER — Telehealth: Payer: Self-pay | Admitting: Family Medicine

## 2015-10-09 NOTE — Telephone Encounter (Signed)
This is documentation for on call.  Patient called on call Saturday about 5pm.   He noted that morning not eating anything other than some crackers, but had taken some ibuprofen.   He had sphagetti dinner the night before.  He played golf in the morning.  By lunch/early afternoo  n had significant epigastric pain.  He normally takes Nexium daily, but this seemed different.   He notes the pain was persistent, but denied nausea, vomiting, diarrhea, constipation, chest pain, SOB, dyspnea, edema.  Urination was fine, last BM the day prior, but had been normal.   Denied fever, chills, body aches.  Mainly just persistent epigastric pain.  He notes that he is normally healthy, non smoker, no recent alcohol use, no significant family hx/o heart disease.    After discussing possible causes, advised that it could still be gastritis vs ulcer vs cardiac vs other abdominal process.  Advised he try extra Tums, zantac, and if not improving or worse or new symptoms, consider going to the ED.   If cardiac symptoms as discussed, call 911.

## 2015-10-09 NOTE — Telephone Encounter (Signed)
TOC CALL. Pt called in to make a hospital follow up appt. I spoke to pt to document TOC call. PT states that he is still not 100% but much better that Saturday. Pt states he is still queasy, bloated and has an upset stomach. He states that the hospital did not give him any medications to take at home although he was on intravenous antibiotic while in hospital. Pt also states that all meds stated that same. Pt scheduled an appt to come in tomorrow. Pt was informed that if he needed anything between now and then to call the office. Pt was also told that is pain increased to level of the other day to go to the ER. Pt verbalized understanding.

## 2015-10-10 ENCOUNTER — Encounter: Payer: Self-pay | Admitting: Family Medicine

## 2015-10-10 ENCOUNTER — Ambulatory Visit
Admission: RE | Admit: 2015-10-10 | Discharge: 2015-10-10 | Disposition: A | Discharge status: Home / Self Care | Payer: BLUE CROSS/BLUE SHIELD | Source: Ambulatory Visit | Attending: Family Medicine | Admitting: Family Medicine

## 2015-10-10 ENCOUNTER — Ambulatory Visit (INDEPENDENT_AMBULATORY_CARE_PROVIDER_SITE_OTHER): Payer: BLUE CROSS/BLUE SHIELD | Admitting: Family Medicine

## 2015-10-10 VITALS — BP 104/72 | HR 72 | Temp 98.2°F | Ht 72.0 in | Wt 187.5 lb

## 2015-10-10 DIAGNOSIS — Z8719 Personal history of other diseases of the digestive system: Secondary | ICD-10-CM

## 2015-10-10 DIAGNOSIS — K5669 Other intestinal obstruction: Secondary | ICD-10-CM | POA: Diagnosis not present

## 2015-10-10 DIAGNOSIS — K56600 Partial intestinal obstruction, unspecified as to cause: Secondary | ICD-10-CM

## 2015-10-10 DIAGNOSIS — I700 Atherosclerosis of aorta: Secondary | ICD-10-CM

## 2015-10-10 NOTE — Progress Notes (Signed)
   Subjective:    Patient ID: Gerald [name], male    DOB: 1955/12/19, 59 y.o.   MRN: [id number]  HPI He is here for follow-up visit after recent hospitalization for evaluation of abdominal pain. The emergency room record, discharge summary lab and x-rays were reviewed. CT scan did show small bowel obstruction that apparently cleared fairly quickly as his visit was only 1 day. At the present time he is having a squeamish stomach but no vomiting or abdominal pain. He did eat half a banana today and had no problem with that. No fever, chills.   Review of Systems     Objective:   Physical Exam Alert and in no distress. Cardiac exam shows regular rhythm without murmurs or gallops. Lungs clear to auscultation. Abdominal exam shows slight abdominal distention with high-pitched tinkling bowel sounds. The x-rays were reviewed.case was discussed with Dr. Gwenlyn Found prior to x-rays who recommended 2 views.      Assessment & Plan:  History of small bowel obstruction - Plan: DG Abd 2 Views  Atherosclerosis of abdominal aorta (HCC)  Partial small bowel obstruction ([place]) follow-up x-ray did show partial obstruction. The case was discussed with Dr. Henrene Pastor who recommended watchful waiting. Information was conveyed to the patient. He will call if he has worsening abdominal pain, vomiting.

## 2015-10-12 ENCOUNTER — Telehealth: Payer: Self-pay | Admitting: Family Medicine

## 2015-10-12 NOTE — Telephone Encounter (Signed)
Pt's wife came in to pay on account and left a note for you. I am forwarding back to you in folder.

## 2015-10-25 ENCOUNTER — Ambulatory Visit (INDEPENDENT_AMBULATORY_CARE_PROVIDER_SITE_OTHER): Payer: BLUE CROSS/BLUE SHIELD | Admitting: Family Medicine

## 2015-10-25 ENCOUNTER — Encounter: Payer: Self-pay | Admitting: Family Medicine

## 2015-10-25 VITALS — BP 124/72 | HR 78 | Resp 14 | Wt 186.6 lb

## 2015-10-25 DIAGNOSIS — Z0000 Encounter for general adult medical examination without abnormal findings: Secondary | ICD-10-CM

## 2015-10-25 DIAGNOSIS — K559 Vascular disorder of intestine, unspecified: Secondary | ICD-10-CM

## 2015-10-25 DIAGNOSIS — R7302 Impaired glucose tolerance (oral): Secondary | ICD-10-CM

## 2015-10-25 DIAGNOSIS — I700 Atherosclerosis of aorta: Secondary | ICD-10-CM | POA: Diagnosis not present

## 2015-10-25 DIAGNOSIS — K2100 Gastro-esophageal reflux disease with esophagitis, without bleeding: Secondary | ICD-10-CM

## 2015-10-25 DIAGNOSIS — Z130 Encounter for screening for diseases of the blood and blood-forming organs and certain disorders involving the immune mechanism: Secondary | ICD-10-CM

## 2015-10-25 DIAGNOSIS — F17200 Nicotine dependence, unspecified, uncomplicated: Secondary | ICD-10-CM

## 2015-10-25 DIAGNOSIS — K219 Gastro-esophageal reflux disease without esophagitis: Secondary | ICD-10-CM

## 2015-10-25 DIAGNOSIS — Z720 Tobacco use: Secondary | ICD-10-CM | POA: Diagnosis not present

## 2015-10-25 DIAGNOSIS — Z8719 Personal history of other diseases of the digestive system: Secondary | ICD-10-CM

## 2015-10-25 LAB — POCT GLYCOSYLATED HEMOGLOBIN (HGB A1C): Hemoglobin A1C: 6.1

## 2015-10-25 NOTE — Progress Notes (Signed)
Subjective:    Patient ID: Gerald [name], male    DOB: Oct 18, 1956, 59 y.o.   MRN: [id number]  HPI Here for complete examination. He has had difficulty with recurrent sinus disease and has seen ENT. They're concerned about possible immune/collagen vascular issues and would like some blood work done. He is also scheduled to see allergy to get their impression on his recurrent sinus disease. He also is had difficulty with a small bowel obstruction which was diagnosed as ischemic enteritis. The CT scan did show questionable blockage. Presently he does complain of having a queasy stomach. He's had no nausea, vomiting and states his bowel habits are now normal. CT scan did show evidence of atherosclerosis. Have a previous history of reflux and is using Nexium for this. He does smoke but it's usually a few cigarettes every day. Family and social history as well as home life and work are going well. His Asians and health maintenance was also reviewed. He has had no difficulty with urinary symptoms. His past record does show slightly elevated blood sugar.   Review of Systems  All other systems reviewed and are negative.      Objective:   Physical Exam BP 124/72 mmHg  Pulse 78  Resp 14  Wt 186 lb 9.6 oz (84.641 kg)  General Appearance:    Alert, cooperative, no distress, appears stated age  Head:    Normocephalic, without obvious abnormality, atraumatic  Eyes:    PERRL, conjunctiva/corneas clear, EOM's intact, fundi    benign  Ears:    Normal TM's and external ear canals  Nose:   Nares normal, mucosa normal, no drainage or sinus   tenderness  Throat:   Lips, mucosa, and tongue normal; teeth and gums normal  Neck:   Supple, no lymphadenopathy;  thyroid:  no   enlargement/tenderness/nodules; no carotid   bruit or JVD  Back:    Spine nontender, no curvature, ROM normal, no CVA     tenderness  Lungs:     Clear to auscultation bilaterally without wheezes, rales or     ronchi; respirations  unlabored  Chest Wall:    No tenderness or deformity   Heart:    Regular rate and rhythm, S1 and S2 normal, no murmur, rub   or gallop  Breast Exam:    No chest wall tenderness, masses or gynecomastia  Abdomen:     Soft, non-tender, nondistended, normoactive bowel sounds,    no masses, no hepatosplenomegaly        Extremities:   No clubbing, cyanosis or edema  Pulses:   2+ and symmetric all extremities  Skin:   Skin color, texture, turgor normal, no rashes or lesions  Lymph nodes:   Cervical, supraclavicular, and axillary nodes normal  Neurologic:   CNII-XII intact, normal strength, sensation and gait; reflexes 2+ and symmetric throughout          Psych:   Normal mood, affect, hygiene and grooming.   A1C 6.0       Assessment & Plan:  Routine general medical examination at a health care facility  Encounter for screening for diseases of the blood and blood-forming organs and certain disorders involving the immune mechanism - Plan: Angiotensin converting enzyme, ANCA Screen Reflex Titer, Thyroid Panel CP3058, ANA, HgB A1c  History of small bowel obstruction  Atherosclerosis of abdominal aorta (HCC)  Gastroesophageal reflux disease, esophagitis presence not specified  Current smoker on some days  Reflux esophagitis  Ischemic enteritis (HCC)  Glucose intolerance (  impaired glucose tolerance) discussed his abdominal symptoms and again explained that if the abdominal pain reoccurs, we will pursue this more aggressively. He will also keep his appointment with allergist and we will send the lab data when it arrives. I also discussed close intolerance with him. His lifestyle is actually fairly good and so making major changes in terms of diet and exercise although good will probably have little effect.

## 2015-10-26 ENCOUNTER — Telehealth: Payer: Self-pay

## 2015-10-26 LAB — THYROID PANEL CP3058
Free T4: 1.05 ng/dL (ref 0.80–1.80)
T3, Free: 3.1 pg/mL (ref 2.3–4.2)
TSH: 2.223 u[IU]/mL (ref 0.350–4.500)
Thyroglobulin Ab: <1 IU/mL (ref <2)
Thyroperoxidase Ab SerPl-aCnc: 4 IU/mL (ref <9)

## 2015-10-26 LAB — ANA: Anti Nuclear Antibody(ANA): NEGATIVE

## 2015-10-26 LAB — ANGIOTENSIN CONVERTING ENZYME: Angiotensin-Converting Enzyme: 43 U/L (ref 8–52)

## 2015-10-26 LAB — ANCA SCREEN W REFLEX TITER: ANCA Screen: NEGATIVE

## 2015-10-26 NOTE — Telephone Encounter (Signed)
Faxed lab results to dr byers, ent and dr Donneta Romberg, allergy per dr Sydnee Cabal request

## 2016-02-07 ENCOUNTER — Telehealth: Payer: Self-pay | Admitting: Family Medicine

## 2016-02-07 NOTE — Telephone Encounter (Signed)
Wife states ins requiring P.A. For Esomeprazole if exceeds more than 90 pills in years & he has exceeded this. Completed P.A. ESOMEPRAZOLE

## 2016-02-11 NOTE — Telephone Encounter (Signed)
P.A. Approved til 02/07/19

## 2016-02-28 ENCOUNTER — Telehealth: Payer: Self-pay | Admitting: Family Medicine

## 2016-02-28 ENCOUNTER — Ambulatory Visit (INDEPENDENT_AMBULATORY_CARE_PROVIDER_SITE_OTHER): Payer: BLUE CROSS/BLUE SHIELD | Admitting: Family Medicine

## 2016-02-28 ENCOUNTER — Encounter: Payer: Self-pay | Admitting: Family Medicine

## 2016-02-28 VITALS — BP 124/70 | Wt 190.0 lb

## 2016-02-28 DIAGNOSIS — R599 Enlarged lymph nodes, unspecified: Secondary | ICD-10-CM | POA: Diagnosis not present

## 2016-02-28 DIAGNOSIS — R591 Generalized enlarged lymph nodes: Secondary | ICD-10-CM

## 2016-02-28 NOTE — Progress Notes (Signed)
   Subjective:    Patient ID: Gerald [name], male    DOB: 07-Dec-1955, 60 y.o.   MRN: [id number]  HPI He noted a lesion in the left submandibular area and is concerned about cancer. Apparently a few of his friends are coming down with throats last tonsillar cancer. He is noted no swelling, sore throat or other discomfort.   Review of Systems     Objective:   Physical Exam Alert and in no distress. Throat is clear. Neck is supple. He does have a 1 cm round smooth movable lesion in the left submandibular area.       Assessment & Plan:  Lymphadenopathy of head and neck I reassured him that I found no problem. Discussed follow-up if he feels any other changes.

## 2016-02-28 NOTE — Telephone Encounter (Signed)
Pt has a knot that has come up on his jaw line and he is nervous that it might be something bad.  He only wants to see you but he is out of town until around 3:30 this afternoon and wanted to see if you could work him in for a quick visit this afternoon.  I explained you are booked this afternoon but he doesn't want to see anyone else. And you are off the next 2 days.  Do you want me to work him in?

## 2016-02-29 NOTE — Telephone Encounter (Signed)
appt made yesterday

## 2016-03-11 ENCOUNTER — Other Ambulatory Visit: Payer: Self-pay | Admitting: Family Medicine

## 2016-03-11 ENCOUNTER — Other Ambulatory Visit: Payer: Self-pay | Admitting: Internal Medicine

## 2016-07-09 ENCOUNTER — Ambulatory Visit (INDEPENDENT_AMBULATORY_CARE_PROVIDER_SITE_OTHER): Payer: BLUE CROSS/BLUE SHIELD | Admitting: Family Medicine

## 2016-07-09 ENCOUNTER — Encounter: Payer: Self-pay | Admitting: Family Medicine

## 2016-07-09 ENCOUNTER — Encounter: Payer: Self-pay | Admitting: Internal Medicine

## 2016-07-09 VITALS — BP 112/70 | HR 77 | Ht 72.5 in | Wt 186.4 lb

## 2016-07-09 DIAGNOSIS — Z0000 Encounter for general adult medical examination without abnormal findings: Secondary | ICD-10-CM

## 2016-07-09 DIAGNOSIS — Z8719 Personal history of other diseases of the digestive system: Secondary | ICD-10-CM

## 2016-07-09 DIAGNOSIS — Z87891 Personal history of nicotine dependence: Secondary | ICD-10-CM | POA: Diagnosis not present

## 2016-07-09 DIAGNOSIS — Z23 Encounter for immunization: Secondary | ICD-10-CM | POA: Diagnosis not present

## 2016-07-09 DIAGNOSIS — K219 Gastro-esophageal reflux disease without esophagitis: Secondary | ICD-10-CM

## 2016-07-09 DIAGNOSIS — Z1211 Encounter for screening for malignant neoplasm of colon: Secondary | ICD-10-CM | POA: Diagnosis not present

## 2016-07-09 DIAGNOSIS — E785 Hyperlipidemia, unspecified: Secondary | ICD-10-CM | POA: Diagnosis not present

## 2016-07-09 DIAGNOSIS — Z1159 Encounter for screening for other viral diseases: Secondary | ICD-10-CM | POA: Diagnosis not present

## 2016-07-09 DIAGNOSIS — Z125 Encounter for screening for malignant neoplasm of prostate: Secondary | ICD-10-CM | POA: Diagnosis not present

## 2016-07-09 LAB — CBC WITH DIFFERENTIAL/PLATELET
Basophils Absolute: 66 cells/uL (ref 0–200)
Basophils Relative: 1 %
Eosinophils Absolute: 132 cells/uL (ref 15–500)
Eosinophils Relative: 2 %
HCT: 40.4 % (ref 38.5–50.0)
Hemoglobin: 13.9 g/dL (ref 13.2–17.1)
Lymphocytes Relative: 40 %
Lymphs Abs: 2640 cells/uL (ref 850–3900)
MCH: 32.8 pg (ref 27.0–33.0)
MCHC: 34.4 g/dL (ref 32.0–36.0)
MCV: 95.3 fL (ref 80.0–100.0)
MPV: 8.7 fL (ref 7.5–12.5)
Monocytes Absolute: 594 cells/uL (ref 200–950)
Monocytes Relative: 9 %
Neutro Abs: 3168 cells/uL (ref 1500–7800)
Neutrophils Relative %: 48 %
Platelets: 323 10*3/uL (ref 140–400)
RBC: 4.24 MIL/uL (ref 4.20–5.80)
RDW: 14.3 % (ref 11.0–15.0)
WBC: 6.6 10*3/uL (ref 4.0–10.5)

## 2016-07-09 LAB — POCT URINALYSIS DIPSTICK
Bilirubin, UA: NEGATIVE
Blood, UA: NEGATIVE
Glucose, UA: NEGATIVE
Ketones, UA: NEGATIVE
Leukocytes, UA: NEGATIVE
Nitrite, UA: NEGATIVE
Protein, UA: NEGATIVE
Spec Grav, UA: >=1.03
Urobilinogen, UA: NEGATIVE
pH, UA: 6

## 2016-07-09 NOTE — Progress Notes (Signed)
Subjective:    Patient ID: Gerald [name], male    DOB: [birth year], 60 y.o.   MRN: [id number]  HPI He is here for complete examination. He would like a shingles vaccine. He would also like PSA tested today as a good friend was diagnosed with metastatic prostate cancer. Presently he is having no urinary type symptoms. He does have a questionable history of ulcer disease in the past and presently is using Zantac and Nexium more or less on a as needed basis. He usually uses this several times per week. He also has a previous history of ischemic bowel disease however he has had no trouble since last year when he had the episode. He does have underlying allergies and does occasionally treat them. They rarely cause difficulty. Review of the record indicates slightly elevated lipids. He is now a former smoker quitting within the last several months. His work and home life are going quite well. Family and social history as well as health maintenance and immunizations were reviewed.   Review of Systems  All other systems reviewed and are negative.      Objective:   Physical Exam BP 112/70   Pulse 77   Ht 6' 0.5" (1.842 m)   Wt 186 lb 6.4 oz (84.6 kg)   BMI 24.93 kg/m   General Appearance:    Alert, cooperative, no distress, appears stated age  Head:    Normocephalic, without obvious abnormality, atraumatic  Eyes:    PERRL, conjunctiva/corneas clear, EOM's intact, fundi    benign  Ears:    Normal TM's and external ear canals  Nose:   Nares normal, mucosa normal, no drainage or sinus   tenderness  Throat:   Lips, mucosa, and tongue normal; teeth and gums normal  Neck:   Supple, no lymphadenopathy;  thyroid:  no   enlargement/tenderness/nodules; no carotid   bruit or JVD  Back:    Spine nontender, no curvature, ROM normal, no CVA     tenderness  Lungs:     Clear to auscultation bilaterally without wheezes, rales or     ronchi; respirations unlabored  Chest Wall:    No tenderness or  deformity   Heart:    Regular rate and rhythm, S1 and S2 normal, no murmur, rub   or gallop  Breast Exam:    No chest wall tenderness, masses or gynecomastia  Abdomen:     Soft, non-tender, nondistended, normoactive bowel sounds,    no masses, no hepatosplenomegaly  Genitalia:  Normal genitalia.   Rectal:  Deferred   Extremities:   No clubbing, cyanosis or edema  Pulses:   2+ and symmetric all extremities  Skin:   Skin color, texture, turgor normal, no rashes or lesions  Lymph nodes:   Cervical, supraclavicular, and axillary nodes normal  Neurologic:   CNII-XII intact, normal strength, sensation and gait; reflexes 2+ and symmetric throughout          Psych:   Normal mood, affect, hygiene and grooming.          Assessment & Plan:  Need for shingles vaccine - Plan: Varicella-zoster vaccine subcutaneous  Screening for prostate cancer  Routine general medical examination at a health care facility - Plan: POCT Urinalysis Dipstick, CBC with Differential/Platelet, Comprehensive metabolic panel, Lipid panel, PSA  Gastroesophageal reflux disease, esophagitis presence not specified  History of ischemic bowel disease  Former smoker  Hyperlipidemia - Plan: Lipid panel  Need for hepatitis C screening test - Plan: Hepatitis C  antibody  Screen for colon cancer - Plan: Ambulatory referral to Gastroenterology I congratulated him on quitting smoking. Encouraged him to use the GI meds on an as-needed basis. Discussed PSA testing with him in detail. Shingles and flu shot given today. He will also be referred for colonoscopy as it has been 10 years. Discussed the bowel problem and since he is having no difficulty, no interventions or further testing in that regard.

## 2016-07-10 LAB — COMPREHENSIVE METABOLIC PANEL
ALT: 12 U/L (ref 9–46)
AST: 13 U/L (ref 10–35)
Albumin: 4.5 g/dL (ref 3.6–5.1)
Alkaline Phosphatase: 73 U/L (ref 40–115)
BUN: 16 mg/dL (ref 7–25)
CO2: 23 mmol/L (ref 20–31)
Calcium: 9.5 mg/dL (ref 8.6–10.3)
Chloride: 106 mmol/L (ref 98–110)
Creat: 0.9 mg/dL (ref 0.70–1.25)
Glucose, Bld: 102 mg/dL — ABNORMAL HIGH (ref 65–99)
Potassium: 4.6 mmol/L (ref 3.5–5.3)
Sodium: 138 mmol/L (ref 135–146)
Total Bilirubin: 0.4 mg/dL (ref 0.2–1.2)
Total Protein: 7.4 g/dL (ref 6.1–8.1)

## 2016-07-10 LAB — LIPID PANEL
Cholesterol: 222 mg/dL — ABNORMAL HIGH (ref 125–200)
HDL: 39 mg/dL — ABNORMAL LOW (ref >=40)
LDL Cholesterol: 118 mg/dL (ref <130)
Total CHOL/HDL Ratio: 5.7 Ratio — ABNORMAL HIGH (ref <=5.0)
Triglycerides: 323 mg/dL — ABNORMAL HIGH (ref <150)
VLDL: 65 mg/dL — ABNORMAL HIGH (ref <30)

## 2016-07-10 LAB — HEPATITIS C ANTIBODY: HCV Ab: NEGATIVE

## 2016-07-10 LAB — PSA: PSA: 0.3 ng/mL (ref <=4.0)

## 2016-07-11 ENCOUNTER — Encounter: Payer: Self-pay | Admitting: Internal Medicine

## 2016-08-20 ENCOUNTER — Ambulatory Visit: Payer: BLUE CROSS/BLUE SHIELD | Admitting: *Deleted

## 2016-08-20 VITALS — Ht 72.0 in | Wt 186.6 lb

## 2016-08-20 DIAGNOSIS — Z1211 Encounter for screening for malignant neoplasm of colon: Secondary | ICD-10-CM

## 2016-08-20 MED ORDER — NA SULFATE-K SULFATE-MG SULF 17.5-3.13-1.6 GM/177ML PO SOLN: 1.0000 | Freq: Once | ORAL | 0 refills | Status: AC

## 2016-08-20 NOTE — Progress Notes (Signed)
Denies allergies to eggs or soy products. Denies complications with sedation or anesthesia. Denies O2 use. Denies use of diet or weight loss medications.  Emmi instructions given for colonoscopy.  

## 2016-08-26 ENCOUNTER — Encounter: Payer: Self-pay | Admitting: Internal Medicine

## 2016-09-03 ENCOUNTER — Encounter: Payer: Self-pay | Admitting: Internal Medicine

## 2016-09-03 ENCOUNTER — Ambulatory Visit (AMBULATORY_SURGERY_CENTER): Payer: BLUE CROSS/BLUE SHIELD | Admitting: Internal Medicine

## 2016-09-03 VITALS — BP 105/71 | HR 84 | Temp 97.5°F | Resp 14 | Ht 72.0 in | Wt 186.0 lb

## 2016-09-03 DIAGNOSIS — D122 Benign neoplasm of ascending colon: Secondary | ICD-10-CM

## 2016-09-03 DIAGNOSIS — Z1211 Encounter for screening for malignant neoplasm of colon: Secondary | ICD-10-CM | POA: Diagnosis not present

## 2016-09-03 DIAGNOSIS — K635 Polyp of colon: Secondary | ICD-10-CM

## 2016-09-03 DIAGNOSIS — D123 Benign neoplasm of transverse colon: Secondary | ICD-10-CM

## 2016-09-03 DIAGNOSIS — D124 Benign neoplasm of descending colon: Secondary | ICD-10-CM

## 2016-09-03 DIAGNOSIS — Z1212 Encounter for screening for malignant neoplasm of rectum: Secondary | ICD-10-CM | POA: Diagnosis not present

## 2016-09-03 MED ORDER — SODIUM CHLORIDE 0.9 % IV SOLN: 500.0000 mL | INTRAVENOUS | Status: DC

## 2016-09-03 NOTE — Patient Instructions (Signed)
YOU HAD AN ENDOSCOPIC PROCEDURE TODAY AT [facility]:   Refer to the procedure report that was given to you for any specific questions about what was found during the examination.  If the procedure report does not answer your questions, please call your gastroenterologist to clarify.  If you requested that your care partner not be given the details of your procedure findings, then the procedure report has been included in a sealed envelope for you to review at your convenience later.  YOU SHOULD EXPECT: Some feelings of bloating in the abdomen. Passage of more gas than usual.  Walking can help get rid of the air that was put into your GI tract during the procedure and reduce the bloating. If you had a lower endoscopy (such as a colonoscopy or flexible sigmoidoscopy) you may notice spotting of blood in your stool or on the toilet paper. If you underwent a bowel prep for your procedure, you may not have a normal bowel movement for a few days.  Please Note:  You might notice some irritation and congestion in your nose or some drainage.  This is from the oxygen used during your procedure.  There is no need for concern and it should clear up in a day or so.  SYMPTOMS TO REPORT IMMEDIATELY:   Following lower endoscopy (colonoscopy or flexible sigmoidoscopy):  Excessive amounts of blood in the stool  Significant tenderness or worsening of abdominal pains  Swelling of the abdomen that is new, acute  Fever of 100F or higher  For urgent or emergent issues, a gastroenterologist can be reached at any hour by calling [phone].   DIET:  We do recommend a small meal at first, but then you may proceed to your regular diet.  Drink plenty of fluids but you should avoid alcoholic beverages for 24 hours.   Please read all handouts given to you by your recovery RN.  ACTIVITY:  You should plan to take it easy for the rest of today and you should NOT DRIVE or use heavy machinery until  tomorrow (because of the sedation medicines used during the test).    FOLLOW UP: Our staff will call the number listed on your records the next business day following your procedure to check on you and address any questions or concerns that you may have regarding the information given to you following your procedure. If we do not reach you, we will leave a message.  However, if you are feeling well and you are not experiencing any problems, there is no need to return our call.  We will assume that you have returned to your regular daily activities without incident.  If any biopsies were taken you will be contacted by phone or by letter within the next 1-3 weeks.  Please call us at [phone] if you have not heard about the biopsies in 3 weeks.    SIGNATURES/CONFIDENTIALITY: You and/or your care partner have signed paperwork which will be entered into your electronic medical record.  These signatures attest to the fact that that the information above on your After Visit Summary has been reviewed and is understood.  Full responsibility of the confidentiality of this discharge information lies with you and/or your care-partner.  Thank you for letting us take care of your healthcare needs today.

## 2016-09-03 NOTE — Progress Notes (Signed)
A and O x3. Report to RN. Tolerated MAC anesthesia well. 

## 2016-09-03 NOTE — Op Note (Signed)
[facility] Patient Name: Gerald [name] Procedure Date: 09/03/2016 9:02 AM MRN: [id number] Endoscopist: Docia Chuck. Henrene Pastor , MD Age: 60 Referring MD:  Date of Birth: February 27, 1956 Gender: Male Account #: [id number] Procedure:                Colonoscopy, with cold snare polypectomy X4 Indications:              Screening for colorectal malignant neoplasm.                            Negative index exam 2011 Medicines:                Monitored Anesthesia Care Procedure:                Pre-Anesthesia Assessment:                           - Prior to the procedure, a History and Physical                            was performed, and patient medications and                            allergies were reviewed. The patient's tolerance of                            previous anesthesia was also reviewed. The risks                            and benefits of the procedure and the sedation                            options and risks were discussed with the patient.                            All questions were answered, and informed consent                            was obtained. Prior Anticoagulants: The patient has                            taken no previous anticoagulant or antiplatelet                            agents. ASA Grade Assessment: I - A normal, healthy                            patient. After reviewing the risks and benefits,                            the patient was deemed in satisfactory condition to                            undergo the procedure.  After obtaining informed consent, the colonoscope                            was passed under direct vision. Throughout the                            procedure, the patient's blood pressure, pulse, and                            oxygen saturations were monitored continuously. The                            Model CF-HQ190L [id number]) scope was introduced                            through the anus and  advanced to the the cecum,                            identified by appendiceal orifice and ileocecal                            valve. The ileocecal valve, appendiceal orifice,                            and rectum were photographed. The quality of the                            bowel preparation was excellent. The colonoscopy                            was performed without difficulty. The patient                            tolerated the procedure well. The bowel preparation                            used was SUPREP. Scope In: 9:19:08 AM Scope Out: 9:34:12 AM Scope Withdrawal Time: 0 hours 12 minutes 57 seconds  Total Procedure Duration: 0 hours 15 minutes 4 seconds  Findings:                 Four polyps were found in the sigmoid colon,                            transverse colon and ascending colon. The polyps                            were 3 to 5 mm in size. These polyps were removed                            with a cold snare. Resection and retrieval were                            complete.  Internal hemorrhoids were found during retroflexion.                           The exam was otherwise without abnormality on                            direct and retroflexion views. Complications:            No immediate complications. Estimated blood loss:                            None. Estimated Blood Loss:     Estimated blood loss: none. Impression:               - Four 3 to 5 mm polyps in the sigmoid colon, in                            the transverse colon and in the ascending colon,                            removed with a cold snare. Resected and retrieved.                           - Internal hemorrhoids.                           - The examination was otherwise normal on direct                            and retroflexion views. Recommendation:           - Repeat colonoscopy in 3 years for surveillance if                            3 or more adenomas/SSP  otherwise 5 years.                           - Patient has a contact number available for                            emergencies. The signs and symptoms of potential                            delayed complications were discussed with the                            patient. Return to normal activities tomorrow.                            Written discharge instructions were provided to the                            patient.                           - Resume previous diet.                           -  Continue present medications.                           - Await pathology results. Docia Chuck. Henrene Pastor, MD 09/03/2016 9:38:44 AM This report has been signed electronically.

## 2016-09-04 ENCOUNTER — Telehealth: Payer: Self-pay | Admitting: *Deleted

## 2016-09-04 NOTE — Telephone Encounter (Signed)
  Follow up Call-  Call back number 09/03/2016  Post procedure Call Back phone  # [phone]  Permission to leave phone message Yes  Some recent data might be hidden     Patient questions:  Do you have a fever, pain , or abdominal swelling? No. Pain Score  0 *  Have you tolerated food without any problems? Yes.    Have you been able to return to your normal activities? Yes.    Do you have any questions about your discharge instructions: Diet   No. Medications  No. Follow up visit  No.  Do you have questions or concerns about your Care? No.  Actions: * If pain score is 4 or above: No action needed, pain <4.

## 2016-09-06 ENCOUNTER — Encounter: Payer: Self-pay | Admitting: Internal Medicine

## 2016-10-30 ENCOUNTER — Encounter: Payer: BLUE CROSS/BLUE SHIELD | Admitting: Family Medicine

## 2017-02-07 ENCOUNTER — Other Ambulatory Visit: Payer: Self-pay | Admitting: Family Medicine

## 2017-03-11 NOTE — Progress Notes (Signed)
[facility] [place],  [address] Phone: [phone] Subjective:    I'm seeing this patient by the request  of:    CC: Low back pain.  DUK:[id number]  Gerald [name] is a 61 y.o. male coming in with complaint of low back pain. Radiate down the right leg seems to be intermittant. Patient has had this for years. Patient states that it seems to be worsening worsening frequency as well as severity. Walking long distances causes worsening symptoms. Mild weakness of the right leg from time t to time. Rates severity of pain a 5 out of 10. Continues to be active. Can wake him up at night. Better with rest minimal improvement with over-the-counter medications   patient did have imaging done in December 2016 for morbid abdominal pain. When reviewing the abdominal CT patient did have a bilateral L5 spondylolysis with minimal spondylolisthesis.  Past Medical History:  Diagnosis Date  . Deviated septum 09/13/14   severe  . Duodenal ulcer   . Esophageal stricture   . GERD (gastroesophageal reflux disease)   . Hyperlipemia   . Leg length discrepancy   . PUD (peptic ulcer disease)   . Smoker    Past Surgical History:  Procedure Laterality Date  . COLONOSCOPY  2007  . KNEE ARTHROSCOPY    . UPPER GASTROINTESTINAL ENDOSCOPY  2015   Social History   Social History  . Marital status: Married    Spouse name: N/A  . Number of children: N/A  . Years of education: N/A   Occupational History  . Chartered certified accountant    Social History Main Topics  . Smoking status: Former Smoker    Packs/day: 0.25    Types: Cigarettes    Quit date: 06/02/2016  . Smokeless tobacco: Never Used     Comment: Smokes occ.Marland KitchenCounseling sheet given 01-2012   . Alcohol use 0.0 oz/week     Comment: occasional  . Drug use: No  . Sexual activity: Yes   Other Topics Concern  . None   Social History Narrative   Daily caffeine   No Known Allergies Family History    Problem Relation Age of Onset  . Colon polyps Mother   . Arthritis Mother   . Stroke Father   . Colon cancer Neg Hx   . Esophageal cancer Neg Hx   . Rectal cancer Neg Hx   . Stomach cancer Neg Hx     Past medical history, social, surgical and family history all reviewed in electronic medical record.  No pertanent information unless stated regarding to the chief complaint.   Review of Systems:Review of systems updated and as accurate as of 03/12/17  No headache, visual changes, nausea, vomiting, diarrhea, constipation, dizziness, abdominal pain, skin rash, fevers, chills, night sweats, weight loss, swollen lymph nodes, body aches, joint swelling, muscle aches, chest pain, shortness of breath, mood changes.   Objective  Blood pressure 128/72, pulse 93, resp. rate 16, weight 184 lb 2 oz (83.5 kg), SpO2 98 %. Systems examined below as of 03/12/17   General: No apparent distress alert and oriented x3 mood and affect normal, dressed appropriately.  HEENT: Pupils equal, extraocular movements intact  Respiratory: Patient's speak in full sentences and does not appear short of breath  Cardiovascular: No lower extremity edema, non tender, no erythema  Skin: Warm dry intact with no signs of infection or rash on extremities or on axial skeleton.  Abdomen: Soft nontender  Neuro: Cranial nerves II  through XII are intact, neurovascularly intact in all extremities with 2+ DTRs and 2+ pulses.  Lymph: No lymphadenopathy of posterior or anterior cervical chain or axillae bilaterally.  Gait normal with good balance and coordination.  MSK:  Non tender with full range of motion and good stability and symmetric strength and tone of shoulders, elbows, wrist, hip, knee and ankles bilaterally.  Back Exam:  Inspection: loss of lordosis Motion: Flexion 40 deg, Extension 15 deg, Side Bending to 40 deg bilaterally,  Rotation to 40 deg bilaterally  SLR laying: Negative  XSLR laying: Negative  Palpable  tenderness: tender to palpation over L5-S1 right-sided. FABER: negative. Sensory change: Gross sensation intact to all lumbar and sacral dermatomes.  Reflexes: 2+ at both patellar tendons, 2+ at achilles tendons, Babinski's downgoing.  Strength at foot  4 out of 5 but symmetric  Osteopathic findings T3 extended rotated and side bent right inhaled third rib T9 extended rotated and side bent left L2 flexed rotated and side bent right L5 flexed rotated and side bent left Sacrum right on right  Procedure note  97110; 15 minutes spent for Therapeutic exercises as stated in above notes.  This included exercises focusing on stretching, strengthening, with significant focus on eccentric aspects.  Low back exercises that included:  Pelvic tilt/bracing instruction to focus on control of the pelvic girdle and lower abdominal muscles  Glute strengthening exercises, focusing on proper firing of the glutes without engaging the low back muscles Proper stretching techniques for maximum relief for the hamstrings, hip flexors, low back and some rotation where tolerated   Proper technique shown and discussed handout in great detail with ATC.  All questions were discussed and answered.      Impression and Recommendations:     This case required medical decision making of moderate complexity.      Note: This dictation was prepared with Dragon dictation along with smaller phrase technology. Any transcriptional errors that result from this process are unintentional.

## 2017-03-12 ENCOUNTER — Ambulatory Visit (INDEPENDENT_AMBULATORY_CARE_PROVIDER_SITE_OTHER): Payer: BLUE CROSS/BLUE SHIELD | Admitting: Family Medicine

## 2017-03-12 ENCOUNTER — Encounter: Payer: Self-pay | Admitting: Family Medicine

## 2017-03-12 DIAGNOSIS — M48061 Spinal stenosis, lumbar region without neurogenic claudication: Secondary | ICD-10-CM

## 2017-03-12 DIAGNOSIS — M999 Biomechanical lesion, unspecified: Secondary | ICD-10-CM

## 2017-03-12 MED ORDER — GABAPENTIN 100 MG PO CAPS: 200.0000 mg | ORAL_CAPSULE | Freq: Every day | ORAL | 3 refills | Status: DC

## 2017-03-12 NOTE — Patient Instructions (Signed)
Good to see you.  Ice 20 minutes 2 times daily. Usually after activity and before bed. Exercises 3 times a week.  .Gabapentin 200mg  at night Over the counter get  Iron 65 mg and take with 500mg  of vitamin C Vitamin D 2000 IU daily  I would change the treadmill to elliptical or bike.  OK to do any other activity  See me again in 3-4 weeks.

## 2017-03-12 NOTE — Assessment & Plan Note (Signed)
Noted on previous imaging.  Discussed HEP responded well to manipulation. Gabapentin prescribed. Discussed over-the-counter medications, discussed core strengthening, proper shoes, recovery. Follow-up again in 4 weeks

## 2017-03-12 NOTE — Assessment & Plan Note (Signed)
Decision today to treat with OMT was based on Physical Exam  After verbal consent patient was treated with HVLA, ME, FPR techniques in  thoracic, lumbar and sacral areas  Patient tolerated the procedure well with improvement in symptoms  Patient given exercises, stretches and lifestyle modifications  See medications in patient instructions if given  Patient will follow up in 4 weeks 

## 2017-04-15 ENCOUNTER — Encounter: Payer: Self-pay | Admitting: Family Medicine

## 2017-04-15 ENCOUNTER — Ambulatory Visit (INDEPENDENT_AMBULATORY_CARE_PROVIDER_SITE_OTHER): Payer: BLUE CROSS/BLUE SHIELD | Admitting: Family Medicine

## 2017-04-15 ENCOUNTER — Ambulatory Visit (INDEPENDENT_AMBULATORY_CARE_PROVIDER_SITE_OTHER)
Admission: RE | Admit: 2017-04-15 | Discharge: 2017-04-15 | Disposition: A | Discharge status: Home / Self Care | Payer: BLUE CROSS/BLUE SHIELD | Source: Ambulatory Visit | Attending: Family Medicine | Admitting: Family Medicine

## 2017-04-15 VITALS — BP 118/78 | HR 83 | Ht 72.5 in | Wt 184.0 lb

## 2017-04-15 DIAGNOSIS — M999 Biomechanical lesion, unspecified: Secondary | ICD-10-CM | POA: Diagnosis not present

## 2017-04-15 DIAGNOSIS — M48061 Spinal stenosis, lumbar region without neurogenic claudication: Secondary | ICD-10-CM

## 2017-04-15 MED ORDER — GABAPENTIN 300 MG PO CAPS: 300.0000 mg | ORAL_CAPSULE | Freq: Every day | ORAL | 3 refills | Status: DC

## 2017-04-15 NOTE — Progress Notes (Signed)
[facility] [place], [place] [address] Phone: [phone] Subjective:      CC: Low back pain f/u   [id number]  Gerald [name] is a 61 y.o. male coming in with complaint of low back pain. Patient was seen by me and there was a concern for patient having potentially spinal stenosis but some patient's presentation. Patient elected try conservative therapy including gabapentin as well as home exercises and osteopathic manipulation. Patient states a very small improvement overall. Feels like he is sleeping better with the gabapentin. Possibly more radicular symptoms going down the right leg with some cramping of the right calf. Still playing golf twice a week. Has a trip planned to go to [place] in August.   patient did have imaging done in December 2016 for morbid abdominal pain. When reviewing the abdominal CT patient did have a bilateral L5 spondylolysis with minimal spondylolisthesis.  Past Medical History:  Diagnosis Date  . Deviated septum 09/13/14   severe  . Duodenal ulcer   . Esophageal stricture   . GERD (gastroesophageal reflux disease)   . Hyperlipemia   . Leg length discrepancy   . PUD (peptic ulcer disease)   . Smoker    Past Surgical History:  Procedure Laterality Date  . COLONOSCOPY  2007  . KNEE ARTHROSCOPY    . UPPER GASTROINTESTINAL ENDOSCOPY  2015   Social History   Social History  . Marital status: Married    Spouse name: N/A  . Number of children: N/A  . Years of education: N/A   Occupational History  . Chartered certified accountant    Social History Main Topics  . Smoking status: Former Smoker    Packs/day: 0.25    Types: Cigarettes    Quit date: 06/02/2016  . Smokeless tobacco: Never Used     Comment: Smokes occ.Marland KitchenCounseling sheet given 01-2012   . Alcohol use 0.0 oz/week     Comment: occasional  . Drug use: No  . Sexual activity: Yes   Other Topics Concern  . None   Social History Narrative   Daily  caffeine   No Known Allergies Family History  Problem Relation Age of Onset  . Colon polyps Mother   . Arthritis Mother   . Stroke Father   . Colon cancer Neg Hx   . Esophageal cancer Neg Hx   . Rectal cancer Neg Hx   . Stomach cancer Neg Hx     Past medical history, social, surgical and family history all reviewed in electronic medical record.  No pertanent information unless stated regarding to the chief complaint.   Review of Systems: No headache, visual changes, nausea, vomiting, diarrhea, constipation, dizziness, abdominal pain, skin rash, fevers, chills, night sweats, weight loss, swollen lymph nodes, body aches, joint swelling, muscle aches, chest pain, shortness of breath, mood changes.    Objective  Blood pressure 118/78, pulse 83, height 6' 0.5" (1.842 m), weight 184 lb (83.5 kg), SpO2 96 %.   Systems examined below as of 04/15/17 General: NAD A&O x3 mood, affect normal  HEENT: Pupils equal, extraocular movements intact no nystagmus Respiratory: not short of breath at rest or with speaking Cardiovascular: No lower extremity edema, non tender Skin: Warm dry intact with no signs of infection or rash on extremities or on axial skeleton. Abdomen: Soft nontender, no masses Neuro: Cranial nerves  intact, neurovascularly intact in all extremities with 2+ DTRs and 2+ pulses. Lymph: No lymphadenopathy appreciated today  Gait normal with  good balance and coordination.  MSK: Non tender with full range of motion and good stability and symmetric strength and tone of shoulders, elbows, wrist,  knee hips and ankles bilaterally.   Back Exam:  Inspection: loss of lordosis Motion: Flexion 40 deg, Extension 15 deg, Side Bending to 40 deg bilaterally,  Rotation to 40 deg bilaterally  SLR laying: Negative  XSLR laying: Negative  Palpable tenderness: tender to palpation in the paraspinal musculature mostly around L5 bilaterally. FABER: tightness on the right side. Sensory change: Gross  sensation intact to all lumbar and sacral dermatomes.  Reflexes: 2+ at both patellar tendons, 2+ at achilles tendons, Babinski's downgoing.  Strength at foot  4 out of 5 but symmetric  Osteopathic findings T3 extended rotated and side bent right inhaled third rib T6 extended rotated and side bent left L3 flexed rotated and side bent right Sacrum right on right    Impression and Recommendations:     This case required medical decision making of moderate complexity.      Note: This dictation was prepared with Dragon dictation along with smaller phrase technology. Any transcriptional errors that result from this process are unintentional.

## 2017-04-15 NOTE — Assessment & Plan Note (Signed)
Patient continues to have signs and symptoms consistent with more of a lumbar spinal stenosis. Discussed with patient at great length. Patient will get x-rays today. Last x-rays were greater than 2 years ago. We discussed icing regimen, home exercises, which activities to do in which ones to avoid. Patient will continue to work on core stability. Increase gabapentin 300 mg Return in 2-4 weeks.

## 2017-04-15 NOTE — Assessment & Plan Note (Signed)
Decision today to treat with OMT was based on Physical Exam  After verbal consent patient was treated with HVLA, ME, FPR techniques in thoracic, lumbar and sacral areas  Patient tolerated the procedure well with improvement in symptoms  Patient given exercises, stretches and lifestyle modifications  See medications in patient instructions if given  Patient will follow up in 2-4 weeks 

## 2017-04-15 NOTE — Patient Instructions (Signed)
Good to see you  Try 3 pills of the gabapentin at night New exercises for the ankle.  Lumbar xray downstairs today  See me again in 2-3 weeks and if not better we will try piriformis injection and then will discuss MRI  Keep up everything else.

## 2017-05-12 ENCOUNTER — Ambulatory Visit (INDEPENDENT_AMBULATORY_CARE_PROVIDER_SITE_OTHER): Payer: BLUE CROSS/BLUE SHIELD | Admitting: Family Medicine

## 2017-05-12 ENCOUNTER — Encounter: Payer: Self-pay | Admitting: Family Medicine

## 2017-05-12 VITALS — BP 142/86 | HR 97 | Ht 72.5 in | Wt 182.0 lb

## 2017-05-12 DIAGNOSIS — M999 Biomechanical lesion, unspecified: Secondary | ICD-10-CM | POA: Diagnosis not present

## 2017-05-12 DIAGNOSIS — G5701 Lesion of sciatic nerve, right lower limb: Secondary | ICD-10-CM | POA: Diagnosis not present

## 2017-05-12 DIAGNOSIS — M48061 Spinal stenosis, lumbar region without neurogenic claudication: Secondary | ICD-10-CM | POA: Diagnosis not present

## 2017-05-12 NOTE — Progress Notes (Signed)
[facility] [place], [place] [address] Phone: [phone] Subjective:     CC: Low back pain f/u   NAT:[id number]  Gerald [name] is a 61 y.o. male coming in with complaint of low back pain. Patient was seen by me and there was a concern for patient having potentially spinal stenosis but some patient's presentation. Patient elected try conservative therapy including gabapentin as well as home exercises and osteopathic manipulation. Was doing much better. Patient is having worsening symptoms though. Patient is having more radicular symptoms of the right leg. Some numbness again. States that the gabapentin has helped. Manipulation has helped. Still though unfortunately not completely gone. Patient will be doing a significant amount walking in the next several months he would like to be closer to pain-free. Cannot increase the medications because of the side effects.   patient did have imaging done in December 2016 for morbid abdominal pain. When reviewing the abdominal CT patient did have a bilateral L5 spondylolysis with minimal spondylolisthesis.  Past Medical History:  Diagnosis Date  . Deviated septum 09/13/14   severe  . Duodenal ulcer   . Esophageal stricture   . GERD (gastroesophageal reflux disease)   . Hyperlipemia   . Leg length discrepancy   . PUD (peptic ulcer disease)   . Smoker    Past Surgical History:  Procedure Laterality Date  . COLONOSCOPY  2007  . KNEE ARTHROSCOPY    . UPPER GASTROINTESTINAL ENDOSCOPY  2015   Social History   Social History  . Marital status: Married    Spouse name: N/A  . Number of children: N/A  . Years of education: N/A   Occupational History  . Chartered certified accountant    Social History Main Topics  . Smoking status: Former Smoker    Packs/day: 0.25    Types: Cigarettes    Quit date: 06/02/2016  . Smokeless tobacco: Never Used     Comment: Smokes occ.Marland KitchenCounseling sheet given 01-2012   . Alcohol  use 0.0 oz/week     Comment: occasional  . Drug use: No  . Sexual activity: Yes   Other Topics Concern  . None   Social History Narrative   Daily caffeine   No Known Allergies Family History  Problem Relation Age of Onset  . Colon polyps Mother   . Arthritis Mother   . Stroke Father   . Colon cancer Neg Hx   . Esophageal cancer Neg Hx   . Rectal cancer Neg Hx   . Stomach cancer Neg Hx     Past medical history, social, surgical and family history all reviewed in electronic medical record.  No pertanent information unless stated regarding to the chief complaint.   Review of Systems: No headache, visual changes, nausea, vomiting, diarrhea, constipation, dizziness, abdominal pain, skin rash, fevers, chills, night sweats, weight loss, swollen lymph nodes, body aches, joint swelling, muscle aches, chest pain, shortness of breath, mood changes.    Objective  Height 6' 0.5" (1.842 m), weight 182 lb (82.6 kg).   Systems examined below as of 05/12/17 General: NAD A&O x3 mood, affect normal  HEENT: Pupils equal, extraocular movements intact no nystagmus Respiratory: not short of breath at rest or with speaking Cardiovascular: No lower extremity edema, non tender Skin: Warm dry intact with no signs of infection or rash on extremities or on axial skeleton. Abdomen: Soft nontender, no masses Neuro: Cranial nerves  intact, neurovascularly intact in all extremities with 2+ DTRs and  2+ pulses. Lymph: No lymphadenopathy appreciated today  Gait normal with good balance and coordination.  MSK: Non tender with full range of motion and good stability and symmetric strength and tone of shoulders, elbows, wrist,  knee hips and ankles bilaterally.   Back Exam:  Inspection: Unremarkable  Motion: Flexion 45 deg, Extension 25 deg, Side Bending to 35 deg bilaterally,  Rotation to 45 deg bilaterally  SLR laying: Negative  XSLR laying: Negative  Palpable tenderness: TTP on the right side and in the  piriformis. Marland Kitchen FABER: negative. Sensory change: Gross sensation intact to all lumbar and sacral dermatomes.  Reflexes: 2+ at both patellar tendons, 2+ at achilles tendons, Babinski's downgoing.  Strength at foot  Plantar-flexion: 5/5 Dorsi-flexion: 5/5 Eversion: 5/5 Inversion: 5/5  Leg strength  Quad: 5/5 Hamstring: 5/5 Hip flexor: 5/5 Hip abductors: 4/5  Gait unremarkable.   Osteopathic findings T3 extended rotated and side bent right inhaled third rib T7 extended rotated and side bent left L1 flexed rotated and side bent right Sacrum left on left      Impression and Recommendations:     This case required medical decision making of moderate complexity.      Note: This dictation was prepared with Dragon dictation along with smaller phrase technology. Any transcriptional errors that result from this process are unintentional.

## 2017-05-12 NOTE — Assessment & Plan Note (Signed)
Decision today to treat with OMT was based on Physical Exam  After verbal consent patient was treated with HVLA, ME, FPR techniques in  thoracic, lumbar and sacral areas  Patient tolerated the procedure well with improvement in symptoms  Patient given exercises, stretches and lifestyle modifications  See medications in patient instructions if given  Patient will follow up in 5-6 weeks 

## 2017-05-12 NOTE — Assessment & Plan Note (Signed)
Degenerative disc disease. I still believe the patient is having spinal stenosis. We discussed the possibility of advanced imaging which patient: This time. Patient will consider this. Once to try a piriformis injection. We will consider this and follow-up. Patient will continue with the other regimen. Follow-up again as stated in patient's patient instructions.

## 2017-05-12 NOTE — Patient Instructions (Signed)
Good to see you  ice is your friend.  745 am on Friday for injection (OK double book) Otherwise continue to do everything else.

## 2017-05-16 ENCOUNTER — Ambulatory Visit: Payer: Self-pay

## 2017-05-16 ENCOUNTER — Encounter: Payer: Self-pay | Admitting: Family Medicine

## 2017-05-16 ENCOUNTER — Ambulatory Visit (INDEPENDENT_AMBULATORY_CARE_PROVIDER_SITE_OTHER): Payer: BLUE CROSS/BLUE SHIELD | Admitting: Family Medicine

## 2017-05-16 VITALS — BP 110/72 | HR 88 | Wt 181.0 lb

## 2017-05-16 DIAGNOSIS — M4310 Spondylolisthesis, site unspecified: Secondary | ICD-10-CM

## 2017-05-16 DIAGNOSIS — M4300 Spondylolysis, site unspecified: Secondary | ICD-10-CM

## 2017-05-16 DIAGNOSIS — M25551 Pain in right hip: Secondary | ICD-10-CM

## 2017-05-16 DIAGNOSIS — G5701 Lesion of sciatic nerve, right lower limb: Secondary | ICD-10-CM | POA: Diagnosis not present

## 2017-05-16 NOTE — Assessment & Plan Note (Signed)
Patient given injection today. Hopefully this will help. Hopefully this is contributing to the radicular symptoms. Patient does have an L5 pars defect the could also be contributing. We discussed possible advance imaging may be warranted but patient is going out of the country for multiple weeks. Patient will come back and we'll discuss further. Continue the gabapentin nightly.

## 2017-05-16 NOTE — Progress Notes (Signed)
[facility] [address], [place] [address] Phone: [phone] Subjective:     CC: Low back pain f/u   [id number]  Gerald [name] is a 61 y.o. male coming in with complaint of low back pain. Patient is been worked up for more of a lumbar radiculopathy. Had been responding fairly well to osteopathic manipulation but is having worsening radicular symptoms. More the pain seemed to be in the piriformis. X-rays did show some progression of some arthritis.   patient did have imaging done in December 2016 for morbid abdominal pain. When reviewing the abdominal CT patient did have a bilateral L5 spondylolysis with minimal spondylolisthesis.  Patient did have repeat x-rays at last exam showing the patient did have a bilateral pars defect at L5.   Past Medical History:  Diagnosis Date  . Deviated septum 09/13/14   severe  . Duodenal ulcer   . Esophageal stricture   . GERD (gastroesophageal reflux disease)   . Hyperlipemia   . Leg length discrepancy   . PUD (peptic ulcer disease)   . Smoker    Past Surgical History:  Procedure Laterality Date  . COLONOSCOPY  2007  . KNEE ARTHROSCOPY    . UPPER GASTROINTESTINAL ENDOSCOPY  2015   Social History   Social History  . Marital status: Married    Spouse name: N/A  . Number of children: N/A  . Years of education: N/A   Occupational History  . Chartered certified accountant    Social History Main Topics  . Smoking status: Former Smoker    Packs/day: 0.25    Types: Cigarettes    Quit date: 06/02/2016  . Smokeless tobacco: Never Used     Comment: Smokes occ.Marland KitchenCounseling sheet given 01-2012   . Alcohol use 0.0 oz/week     Comment: occasional  . Drug use: No  . Sexual activity: Yes   Other Topics Concern  . None   Social History Narrative   Daily caffeine   No Known Allergies Family History  Problem Relation Age of Onset  . Colon polyps Mother   . Arthritis Mother   . Stroke Father   . Colon  cancer Neg Hx   . Esophageal cancer Neg Hx   . Rectal cancer Neg Hx   . Stomach cancer Neg Hx     Past medical history, social, surgical and family history all reviewed in electronic medical record.  No pertanent information unless stated regarding to the chief complaint.   Review of Systems: No headache, visual changes, nausea, vomiting, diarrhea, constipation, dizziness, abdominal pain, skin rash, fevers, chills, night sweats, weight loss, swollen lymph nodes, body aches, joint swelling, muscle aches, chest pain, shortness of breath, mood changes.    Objective  Blood pressure 110/72, pulse 88, weight 181 lb (82.1 kg).   Systems examined below as of 05/16/17 General: NAD A&O x3 mood, affect normal  HEENT: Pupils equal, extraocular movements intact no nystagmus Respiratory: not short of breath at rest or with speaking Cardiovascular: No lower extremity edema, non tender Skin: Warm dry intact with no signs of infection or rash on extremities or on axial skeleton. Abdomen: Soft nontender, no masses Neuro: Cranial nerves  intact, neurovascularly intact in all extremities with 2+ DTRs and 2+ pulses. Lymph: No lymphadenopathy appreciated today  Gait normal with good balance and coordination.  MSK: Non tender with full range of motion and good stability and symmetric strength and tone of shoulders, elbows, wrist,  knee hips and  ankles bilaterally.   Back Exam:  Inspection: Unremarkable  Motion: Flexion 40 deg, Extension 15 deg, Side Bending to 40 deg bilaterally,  Rotation to 45 deg bilaterally  SLR laying: Negative  XSLR laying: Negative  Palpable tenderness: Tender to palpation of the right piriformis. FABER: Positive right. Sensory change: Gross sensation intact to all lumbar and sacral dermatomes.  Reflexes: 2+ at both patellar tendons, 2+ at achilles tendons, Babinski's downgoing.  Strength at foot  Plantar-flexion: 5/5 Dorsi-flexion: 5/5 Eversion: 5/5 Inversion: 5/5  Leg strength   Quad: 5/5 Hamstring: 5/5 Hip flexor: 5/5 Hip abductors: 4/5 but symmetric Gait unremarkable.  Procedure: Real-time Ultrasound Guided Injection of right piriformis tendon sheath Device: GE Logiq Q7 Ultrasound guided injection is preferred based studies that show increased duration, increased effect, greater accuracy, decreased procedural pain, increased response rate, and decreased cost with ultrasound guided versus blind injection.  Verbal informed consent obtained.  Time-out conducted.  Noted no overlying erythema, induration, or other signs of local infection.  Skin prepped in a sterile fashion.  Local anesthesia: Topical Ethyl chloride.  With sterile technique and under real time ultrasound guidance:  With a 22-gauge 3 inch needle patient was injected with a total of 2 mL of 0.5% Marcaine and 1 mL of Kenalog 40 mg/dL within the piriformis tendon sheath. Completed without difficulty  Pain immediately resolved suggesting accurate placement of the medication.  Advised to call if fevers/chills, erythema, induration, drainage, or persistent bleeding.  Images permanently stored and available for review in the ultrasound unit.  Impression: Technically successful ultrasound guided injection.     Impression and Recommendations:     This case required medical decision making of moderate complexity.      Note: This dictation was prepared with Dragon dictation along with smaller phrase technology. Any transcriptional errors that result from this process are unintentional.

## 2017-05-16 NOTE — Patient Instructions (Signed)
4 weeks.  Have a great trip

## 2017-05-28 ENCOUNTER — Encounter: Payer: Self-pay | Admitting: Family Medicine

## 2017-05-28 ENCOUNTER — Ambulatory Visit (INDEPENDENT_AMBULATORY_CARE_PROVIDER_SITE_OTHER): Payer: BLUE CROSS/BLUE SHIELD | Admitting: Family Medicine

## 2017-05-28 VITALS — BP 110/70 | HR 110 | Wt 177.0 lb

## 2017-05-28 DIAGNOSIS — R140 Abdominal distension (gaseous): Secondary | ICD-10-CM | POA: Diagnosis not present

## 2017-05-28 DIAGNOSIS — R634 Abnormal weight loss: Secondary | ICD-10-CM

## 2017-05-28 LAB — CBC WITH DIFFERENTIAL/PLATELET
Basophils Absolute: 0 cells/uL (ref 0–200)
Basophils Relative: 0 %
Eosinophils Absolute: 98 cells/uL (ref 15–500)
Eosinophils Relative: 1 %
HCT: 42.9 % (ref 38.5–50.0)
Hemoglobin: 15 g/dL (ref 13.2–17.1)
Lymphocytes Relative: 35 %
Lymphs Abs: 3430 cells/uL (ref 850–3900)
MCH: 33.6 pg — ABNORMAL HIGH (ref 27.0–33.0)
MCHC: 35 g/dL (ref 32.0–36.0)
MCV: 96.2 fL (ref 80.0–100.0)
MPV: 8.5 fL (ref 7.5–12.5)
Monocytes Absolute: 392 cells/uL (ref 200–950)
Monocytes Relative: 4 %
Neutro Abs: 5880 cells/uL (ref 1500–7800)
Neutrophils Relative %: 60 %
Platelets: 347 10*3/uL (ref 140–400)
RBC: 4.46 MIL/uL (ref 4.20–5.80)
RDW: 14.1 % (ref 11.0–15.0)
WBC: 9.8 10*3/uL (ref 4.0–10.5)

## 2017-05-28 LAB — COMPREHENSIVE METABOLIC PANEL
ALT: 17 U/L (ref 9–46)
AST: 14 U/L (ref 10–35)
Albumin: 4.7 g/dL (ref 3.6–5.1)
Alkaline Phosphatase: 70 U/L (ref 40–115)
BUN: 16 mg/dL (ref 7–25)
CO2: 26 mmol/L (ref 20–31)
Calcium: 9.6 mg/dL (ref 8.6–10.3)
Chloride: 102 mmol/L (ref 98–110)
Creat: 0.99 mg/dL (ref 0.70–1.25)
Glucose, Bld: 106 mg/dL — ABNORMAL HIGH (ref 65–99)
Potassium: 4.4 mmol/L (ref 3.5–5.3)
Sodium: 136 mmol/L (ref 135–146)
Total Bilirubin: 0.5 mg/dL (ref 0.2–1.2)
Total Protein: 7.4 g/dL (ref 6.1–8.1)

## 2017-05-28 NOTE — Progress Notes (Signed)
   Subjective:    Patient ID: Gerald [name], male    DOB: 1955-12-30, 61 y.o.   MRN: [id number]  HPI He is here for consult concerning weight loss. He notes that over the last 3 or 4 weeks his weight has dropped 8 or 9 pounds. He does complain of some slight bloating and urged to have BMs but no more than normal. No nausea, vomiting, black tarry stools, early satiety, relation to greasy foods or milk products. He has been dealing with work-related stress. His home life is going well. He had a colonoscopy recently and has had previous history of endoscopy. 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   Review of Systems     Objective:   Physical Exam Alert and in no distress. Tympanic membranes and canals are normal. Pharyngeal area is normal. Neck is supple without adenopathy or thyromegaly. Cardiac exam shows a regular sinus rhythm without murmurs or gallops. Lungs are clear to auscultation. Abdominal exam shows active bowel sounds without masses or tenderness.       Assessment & Plan:  Weight loss - Plan: CBC with Differential/Platelet, Comprehensive metabolic panel, US Abdomen Limited RUQ  Abdominal bloating - Plan: CBC with Differential/Platelet, Comprehensive metabolic panel, US Abdomen Limited RUQ I will start with blood work and ultrasound and proceed from there. We'll have a very low threshold for referring to GI especially with this underlying history of ischemic enteritis with no good reason for that.

## 2017-05-29 ENCOUNTER — Telehealth: Payer: Self-pay | Admitting: Family Medicine

## 2017-05-29 NOTE — Telephone Encounter (Signed)
Pt called for results. I informed py of results and he requested Cheri call him back. Pt can be reached at [phone].

## 2017-05-29 NOTE — Telephone Encounter (Signed)
Pt called and informed and he verbalized understanding

## 2017-06-04 ENCOUNTER — Ambulatory Visit
Admission: RE | Admit: 2017-06-04 | Discharge: 2017-06-04 | Disposition: A | Discharge status: Home / Self Care | Payer: BLUE CROSS/BLUE SHIELD | Source: Ambulatory Visit | Attending: Family Medicine | Admitting: Family Medicine

## 2017-06-04 ENCOUNTER — Telehealth: Payer: Self-pay | Admitting: Family Medicine

## 2017-06-04 DIAGNOSIS — R634 Abnormal weight loss: Secondary | ICD-10-CM

## 2017-06-04 DIAGNOSIS — R140 Abdominal distension (gaseous): Secondary | ICD-10-CM

## 2017-06-04 NOTE — Telephone Encounter (Signed)
Pt has a CPE scheduled for the end of August. Pt dropped off a form for work. He would like to have this completed now so he can go ahead a get it taken care of. Pt states his waist circumference is 34 in. Please call pt at [phone] and advise when  ready.

## 2017-06-06 ENCOUNTER — Encounter: Payer: BLUE CROSS/BLUE SHIELD | Admitting: Family Medicine

## 2017-06-06 NOTE — Telephone Encounter (Signed)
Pt informed that we cant fill out his form till he comes in for lipids he verbalized understanding

## 2017-06-23 ENCOUNTER — Ambulatory Visit (INDEPENDENT_AMBULATORY_CARE_PROVIDER_SITE_OTHER): Payer: BLUE CROSS/BLUE SHIELD | Admitting: Family Medicine

## 2017-06-23 ENCOUNTER — Encounter: Payer: Self-pay | Admitting: Family Medicine

## 2017-06-23 VITALS — BP 118/74 | HR 95 | Ht 72.0 in | Wt 178.0 lb

## 2017-06-23 DIAGNOSIS — M5416 Radiculopathy, lumbar region: Secondary | ICD-10-CM | POA: Diagnosis not present

## 2017-06-23 DIAGNOSIS — M48061 Spinal stenosis, lumbar region without neurogenic claudication: Secondary | ICD-10-CM | POA: Diagnosis not present

## 2017-06-23 NOTE — Assessment & Plan Note (Signed)
Concern for worsening lumbar stenosis causing more of a lumbar radiculopathy. Discussed with patient at great length. Discussed icing regimen, home exercises, which activities to do in which ones to avoid. Patient is having pain affecting daily activities. At this time and do feel that advance imaging is warranted with patient actually not responding well to the conservative therapy at this point and having a fairly significant pars defect. Patient would be a candidate for epidurals. Encourage him to continue same medications and we will see with advance imaging shows.

## 2017-06-23 NOTE — Patient Instructions (Addendum)
Good to see you  We will get MRI   Depending on findings we will get Epidural  Then see me again In 2-3 weeks after [employer] on the weight loss and consider some other labs including Vit D, iron panel, PSA, ESR, ANA

## 2017-06-23 NOTE — Progress Notes (Signed)
[facility] [address] [place], [place] [address] Phone: [phone] Subjective:     CC: Low back pain f/u   [id number]  Gerald [name] is a 61 y.o. male coming in with complaint of low back pain. Patient is been worked up for more of a lumbar radiculopathy. Had been responding fairly well to osteopathic manipulation but is having worsening radicular symptoms. More the pain seemed to be in the piriformis. X-rays did show some progression of some arthritis.   patient did have imaging done in December 2016 for morbid abdominal pain. When reviewing the abdominal CT patient did have a bilateral L5 spondylolysis with minimal spondylolisthesis.  Patient did have repeat x-rays at last exam showing the patient did have a bilateral pars defect at L5.There is some mild progression.  Patient was given an injection in the piriformis at last exam that did help for approximately 2 weeks. Patient states that unfortunately the pain is starting to come back again. Patient did do a lot of walking while he was out of country. Patient states that now it seems to be affecting daily activities, states that symptoms going up and down stairs is difficult. Anything more than regular daily activities has become uncomfortable.   Past Medical History:  Diagnosis Date  . Deviated septum 09/13/14   severe  . Duodenal ulcer   . Esophageal stricture   . GERD (gastroesophageal reflux disease)   . Hyperlipemia   . Leg length discrepancy   . PUD (peptic ulcer disease)   . Smoker    Past Surgical History:  Procedure Laterality Date  . COLONOSCOPY  2007  . KNEE ARTHROSCOPY    . UPPER GASTROINTESTINAL ENDOSCOPY  2015   Social History   Social History  . Marital status: Married    Spouse name: N/A  . Number of children: N/A  . Years of education: N/A   Occupational History  . Chartered certified accountant    Social History Main Topics  . Smoking status: Former Smoker    Packs/day:  0.25    Types: Cigarettes    Quit date: 06/02/2016  . Smokeless tobacco: Never Used     Comment: Smokes occ.Marland KitchenCounseling sheet given 01-2012   . Alcohol use 0.0 oz/week     Comment: occasional  . Drug use: No  . Sexual activity: Yes   Other Topics Concern  . None   Social History Narrative   Daily caffeine   No Known Allergies Family History  Problem Relation Age of Onset  . Colon polyps Mother   . Arthritis Mother   . Stroke Father   . Colon cancer Neg Hx   . Esophageal cancer Neg Hx   . Rectal cancer Neg Hx   . Stomach cancer Neg Hx     Past medical history, social, surgical and family history all reviewed in electronic medical record.  No pertanent information unless stated regarding to the chief complaint.   Review of Systems: No headache, visual changes, nausea, vomiting, diarrhea, constipation, dizziness, abdominal pain, skin rash, fevers, chills, night sweats, weight loss, swollen lymph nodes, body aches, joint swelling,chest pain, shortness of breath, mood changes.  Positive muscle aches   Objective  Blood pressure 118/74, pulse 95, height 6' (1.829 m), weight 178 lb (80.7 kg), SpO2 98 %.   Systems examined below as of 06/23/17 General: NAD A&O x3 mood, affect normal  HEENT: Pupils equal, extraocular movements intact no nystagmus Respiratory: not short of breath at rest or  with speaking Cardiovascular: No lower extremity edema, non tender Skin: Warm dry intact with no signs of infection or rash on extremities or on axial skeleton. Abdomen: Soft nontender, no masses Neuro: Cranial nerves  intact, neurovascularly intact in all extremities with 2+ DTRs and 2+ pulses. Lymph: No lymphadenopathy appreciated today  Gait normal with good balance and coordination.  MSK: Non tender with full range of motion and good stability and symmetric strength and tone of shoulders, elbows, wrist,  knee hips and ankles bilaterally.   Back Exam:  Inspection: Mild loss of  lordosis Motion: Flexion 40 deg, Extension 15 deg, Side Bending to 40 deg bilaterally,  Rotation to 25 deg bilaterally  SLR laying: Positive nearly bilaterally today. Worsening from previous exam XSLR laying: Negative  Palpable tenderness: Tender to palpation of the right piriformis. Possibly worsen previous exam FABER: Positive right. Sensory change: Gross sensation intact to all lumbar and sacral dermatomes.  Reflexes: 2+ at both patellar tendons, 2+ at achilles tendons, Babinski's downgoing.  Strength at foot  Strength-L4-5 strength on the right side compared to the contralateral side      Impression and Recommendations:     This case required medical decision making of moderate complexity.      Note: This dictation was prepared with Dragon dictation along with smaller phrase technology. Any transcriptional errors that result from this process are unintentional.

## 2017-07-02 ENCOUNTER — Encounter: Payer: Self-pay | Admitting: Family Medicine

## 2017-07-02 ENCOUNTER — Ambulatory Visit (INDEPENDENT_AMBULATORY_CARE_PROVIDER_SITE_OTHER): Payer: BLUE CROSS/BLUE SHIELD | Admitting: Family Medicine

## 2017-07-02 VITALS — BP 128/80 | HR 80 | Ht 72.0 in | Wt 180.0 lb

## 2017-07-02 DIAGNOSIS — Z125 Encounter for screening for malignant neoplasm of prostate: Secondary | ICD-10-CM | POA: Diagnosis not present

## 2017-07-02 DIAGNOSIS — Z0000 Encounter for general adult medical examination without abnormal findings: Secondary | ICD-10-CM | POA: Diagnosis not present

## 2017-07-02 DIAGNOSIS — M48061 Spinal stenosis, lumbar region without neurogenic claudication: Secondary | ICD-10-CM

## 2017-07-02 DIAGNOSIS — Z87891 Personal history of nicotine dependence: Secondary | ICD-10-CM | POA: Diagnosis not present

## 2017-07-02 DIAGNOSIS — Z85828 Personal history of other malignant neoplasm of skin: Secondary | ICD-10-CM

## 2017-07-02 DIAGNOSIS — K219 Gastro-esophageal reflux disease without esophagitis: Secondary | ICD-10-CM | POA: Diagnosis not present

## 2017-07-02 DIAGNOSIS — Z8719 Personal history of other diseases of the digestive system: Secondary | ICD-10-CM

## 2017-07-02 DIAGNOSIS — D126 Benign neoplasm of colon, unspecified: Secondary | ICD-10-CM

## 2017-07-02 LAB — POCT URINALYSIS DIP (PROADVANTAGE DEVICE)
Bilirubin, UA: NEGATIVE
Blood, UA: NEGATIVE
Glucose, UA: NEGATIVE mg/dL
Ketones, POC UA: NEGATIVE mg/dL
Leukocytes, UA: NEGATIVE
Nitrite, UA: NEGATIVE
Protein Ur, POC: NEGATIVE mg/dL
Specific Gravity, Urine: 1.02
Urobilinogen, Ur: NEGATIVE
pH, UA: 6 (ref 5.0–8.0)

## 2017-07-02 NOTE — Progress Notes (Signed)
Subjective:    Patient ID: Gerald [name], male    DOB: June 06, 1956, 61 y.o.   MRN: [id number]  HPI He is here for a complete examination. He has had some back difficulties and has been followed by Dr. Tamala Julian for that. He recently went on a trip to [place] playing golf and apparently did not have any difficulties while he was there. His reflux is under good control. He has had no abdominal symptoms and does have a previous history of ischemic bowel disease. He also has a history of colonic polyps and is scheduled for repeat colonoscopy in the next couple years. 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 He follows up regularly with dermatology. He is a former smoker. His work and home life are going well. Family and social history as well as health maintenance and immunizations were reviewed.   Review of Systems  All other systems reviewed and are negative.      Objective:   Physical Exam BP 128/80   Pulse 80   Ht 6' (1.829 m)   Wt 180 lb (81.6 kg)   SpO2 94%   BMI 24.41 kg/m   General Appearance:    Alert, cooperative, no distress, appears stated age  Head:    Normocephalic, without obvious abnormality, atraumatic  Eyes:    PERRL, conjunctiva/corneas clear, EOM's intact, fundi    benign  Ears:    Normal TM's and external ear canals  Nose:   Nares normal, mucosa normal, no drainage or sinus   tenderness  Throat:   Lips, mucosa, and tongue normal; teeth and gums normal  Neck:   Supple, no lymphadenopathy;  thyroid:  no   enlargement/tenderness/nodules; no carotid   bruit or JVD     Lungs:     Clear to auscultation bilaterally without wheezes, rales or     ronchi; respirations unlabored      Heart:    Regular rate and rhythm, S1 and S2 normal, no murmur, rub   or gallop     Abdomen:     Soft, non-tender, nondistended, normoactive bowel sounds,    no masses, no hepatosplenomegaly  Genitalia:    Normal male external genitalia without  lesions.  Testicles without masses.  No inguinal hernias.  Rectal:    Deferred   Extremities:   No clubbing, cyanosis or edema  Pulses:   2+ and symmetric all extremities  Skin:   Skin color, texture, turgor normal, no rashes or lesions  Lymph nodes:   Cervical, supraclavicular, and axillary nodes normal  Neurologic:   CNII-XII intact, normal strength, sensation and gait; reflexes 2+ and symmetric throughout          Psych:   Normal mood, affect, hygiene and grooming.           Assessment & Plan:  Routine general medical examination at a health care facility - Plan: POCT Urinalysis DIP (Proadvantage Device), Lipid panel, PSA  Former smoker  Gastroesophageal reflux disease, esophagitis presence not specified  Degenerative lumbar spinal stenosis  History of ischemic bowel disease  Adenomatous polyp of colon, unspecified part of colon  History of basal cell cancer - Mid forehead  2018  Screening for prostate cancer - Plan: PSA He is not interested in the flu shot. Discussed PSA testing and gave them information concerning the pros and cons of that as well as surgery and radiation. Otherwise encouraged him to continue to take good care of  himself.

## 2017-07-03 ENCOUNTER — Telehealth: Payer: Self-pay | Admitting: Internal Medicine

## 2017-07-03 LAB — PSA: PSA: 0.7 ng/mL

## 2017-07-03 LAB — LIPID PANEL
Cholesterol: 207 mg/dL — ABNORMAL HIGH (ref <200)
HDL: 37 mg/dL — ABNORMAL LOW (ref >40)
LDL Cholesterol: 117 mg/dL — ABNORMAL HIGH (ref <100)
Total CHOL/HDL Ratio: 5.6 Ratio — ABNORMAL HIGH (ref <5.0)
Triglycerides: 265 mg/dL — ABNORMAL HIGH (ref <150)
VLDL: 53 mg/dL — ABNORMAL HIGH (ref <30)

## 2017-07-03 NOTE — Telephone Encounter (Signed)
I have put 2 shingrix shots aside for pt in the refrig. Pt will call back next week to schedule

## 2017-07-04 ENCOUNTER — Other Ambulatory Visit: Payer: BLUE CROSS/BLUE SHIELD

## 2017-07-08 ENCOUNTER — Other Ambulatory Visit (INDEPENDENT_AMBULATORY_CARE_PROVIDER_SITE_OTHER): Payer: BLUE CROSS/BLUE SHIELD

## 2017-07-08 DIAGNOSIS — Z23 Encounter for immunization: Secondary | ICD-10-CM

## 2017-07-22 ENCOUNTER — Ambulatory Visit
Admission: RE | Admit: 2017-07-22 | Discharge: 2017-07-22 | Disposition: A | Discharge status: Home / Self Care | Payer: BLUE CROSS/BLUE SHIELD | Source: Ambulatory Visit | Attending: Family Medicine | Admitting: Family Medicine

## 2017-07-22 DIAGNOSIS — M5416 Radiculopathy, lumbar region: Secondary | ICD-10-CM

## 2017-07-24 NOTE — Progress Notes (Signed)
Spoke with patient. He would like to go forward with the epidural injection.

## 2017-07-24 NOTE — Progress Notes (Signed)
Spoke with patient. He would like to proceed with epidural.

## 2017-08-18 ENCOUNTER — Telehealth: Payer: Self-pay | Admitting: Family Medicine

## 2017-08-18 DIAGNOSIS — M48061 Spinal stenosis, lumbar region without neurogenic claudication: Secondary | ICD-10-CM

## 2017-08-18 NOTE — Telephone Encounter (Signed)
Patient states he was suppose to be scheduled for an epidural.  States he called [facility] imaging and they do not have a referral.  Is requesting call back in regard.

## 2017-08-18 NOTE — Telephone Encounter (Signed)
Re-sent epidural to [facility]. Advised pt to call [facility] to schedule appt.

## 2017-08-21 ENCOUNTER — Other Ambulatory Visit: Payer: Self-pay | Admitting: Family Medicine

## 2017-08-21 NOTE — Telephone Encounter (Signed)
Refill done.  

## 2017-09-01 ENCOUNTER — Other Ambulatory Visit: Payer: BLUE CROSS/BLUE SHIELD

## 2017-09-03 ENCOUNTER — Ambulatory Visit
Admission: RE | Admit: 2017-09-03 | Discharge: 2017-09-03 | Disposition: A | Discharge status: Home / Self Care | Payer: BLUE CROSS/BLUE SHIELD | Source: Ambulatory Visit | Attending: Family Medicine | Admitting: Family Medicine

## 2017-09-03 DIAGNOSIS — M48061 Spinal stenosis, lumbar region without neurogenic claudication: Secondary | ICD-10-CM

## 2017-09-03 MED ORDER — IOPAMIDOL (ISOVUE-M 200) INJECTION 41%
1.0000 mL | Freq: Once | INTRAMUSCULAR | Status: AC
  Administered 2017-09-03: 1 mL via EPIDURAL

## 2017-09-03 MED ORDER — METHYLPREDNISOLONE ACETATE 40 MG/ML INJ SUSP (RADIOLOG
120.0000 mg | Freq: Once | INTRAMUSCULAR | Status: AC
  Administered 2017-09-03: 120 mg via EPIDURAL

## 2017-09-03 NOTE — Discharge Instructions (Signed)

## 2017-09-17 ENCOUNTER — Telehealth: Payer: Self-pay | Admitting: Family Medicine

## 2017-09-17 NOTE — Telephone Encounter (Signed)
Patient states he is not doing any better. He would like to know if he can get an injection again!

## 2017-09-18 ENCOUNTER — Other Ambulatory Visit: Payer: Self-pay

## 2017-09-18 DIAGNOSIS — M5416 Radiculopathy, lumbar region: Secondary | ICD-10-CM

## 2017-09-18 NOTE — Telephone Encounter (Signed)
Patient states that he hasn't had a lot of relief and would like another injection. Will place order per verbal from Dr. Tamala Julian.

## 2017-09-23 ENCOUNTER — Other Ambulatory Visit (INDEPENDENT_AMBULATORY_CARE_PROVIDER_SITE_OTHER): Payer: BLUE CROSS/BLUE SHIELD

## 2017-09-23 DIAGNOSIS — Z23 Encounter for immunization: Secondary | ICD-10-CM | POA: Diagnosis not present

## 2017-10-02 ENCOUNTER — Ambulatory Visit
Admission: RE | Admit: 2017-10-02 | Discharge: 2017-10-02 | Disposition: A | Discharge status: Home / Self Care | Payer: BLUE CROSS/BLUE SHIELD | Source: Ambulatory Visit | Attending: Family Medicine | Admitting: Family Medicine

## 2017-10-02 DIAGNOSIS — M5416 Radiculopathy, lumbar region: Secondary | ICD-10-CM

## 2017-10-02 MED ORDER — IOPAMIDOL (ISOVUE-M 200) INJECTION 41%
1.0000 mL | Freq: Once | INTRAMUSCULAR | Status: AC
  Administered 2017-10-02: 1 mL via EPIDURAL

## 2017-10-02 MED ORDER — METHYLPREDNISOLONE ACETATE 40 MG/ML INJ SUSP (RADIOLOG
120.0000 mg | Freq: Once | INTRAMUSCULAR | Status: AC
  Administered 2017-10-02: 120 mg via EPIDURAL

## 2017-10-02 NOTE — Discharge Instructions (Signed)

## 2017-11-20 ENCOUNTER — Encounter: Payer: Self-pay | Admitting: Family Medicine

## 2017-11-20 ENCOUNTER — Ambulatory Visit (INDEPENDENT_AMBULATORY_CARE_PROVIDER_SITE_OTHER): Payer: Commercial Indemnity | Admitting: Family Medicine

## 2017-11-20 VITALS — BP 108/72 | HR 87 | Wt 181.8 lb

## 2017-11-20 DIAGNOSIS — M542 Cervicalgia: Secondary | ICD-10-CM

## 2017-11-20 DIAGNOSIS — R000 Tachycardia, unspecified: Secondary | ICD-10-CM

## 2017-11-20 NOTE — Patient Instructions (Signed)
If your heart rate goes up ,keep a record of the rate and if it is regular or irregular

## 2017-11-20 NOTE — Progress Notes (Addendum)
   Subjective:    Patient ID: Gerald [name], male    DOB: 10/03/1956, 62 y.o.   MRN: [id number]  HPI He is here for consult concerning noticing recently of rapid heart rate while he was working out.  He has been working out fairly regularly since September and apparently did not notice this until recently when he was actually checking his pulse while working out.  He has had no chest pain, weakness, diaphoresis, shortness of breath or syncopal episodes.  He smokes very rarely.  No family history of heart disease.  He is under no undue stress is matter fact he has recently semiretired.  He is taking a break and may possibly go to work again later.  He also notes a 3-4-week history of right occipital Headache that does get worse with rotation to the left or to the right.  No numbness, tingling or weakness no other motions tend to make this worse.   Review of Systems     Objective:   Physical Exam Alert and in no distress.  Slight tenderness palpation in the right occipital area.  Normal motion of the neck.  DTRs normal.  Cardiac exam shows regular rhythm without murmurs or gallops.  Lungs show scattered rhonchi.  Neck is supple without adenopathy or thyromegaly. 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       Assessment & Plan:  Tachycardia - Plan: EKG 12-Lead, CBC with Differential/Platelet, Comprehensive metabolic panel, Lipid panel, TSH  Neck pain I recommend that he keep track of his pulse in terms of how rapid it is and whether it is regular or irregular and keep me informed.  Await for the blood results.  At this point I do not see any major cardiac problem. Recommend heat, anti-inflammatory and stretching exercises for his neck.  He was comfortable with this.  11/21/17 his blood work was normal.  He plans to go to the gym and work out.. I have asked him to keep track of his pulse during the workout and at 5 and 10 minutes after he has finished.  He will let me know what the results are.

## 2017-11-21 LAB — COMPREHENSIVE METABOLIC PANEL
ALT: 17 IU/L (ref 0–44)
AST: 16 IU/L (ref 0–40)
Albumin/Globulin Ratio: 1.8 (ref 1.2–2.2)
Albumin: 4.9 g/dL — ABNORMAL HIGH (ref 3.6–4.8)
Alkaline Phosphatase: 77 IU/L (ref 39–117)
BUN/Creatinine Ratio: 13 (ref 10–24)
BUN: 12 mg/dL (ref 8–27)
Bilirubin Total: 0.4 mg/dL (ref 0.0–1.2)
CO2: 24 mmol/L (ref 20–29)
Calcium: 9.9 mg/dL (ref 8.6–10.2)
Chloride: 100 mmol/L (ref 96–106)
Creatinine, Ser: 0.93 mg/dL (ref 0.76–1.27)
GFR calc Af Amer: 102 mL/min/{1.73_m2} (ref >59)
GFR calc non Af Amer: 88 mL/min/{1.73_m2} (ref >59)
Globulin, Total: 2.7 g/dL (ref 1.5–4.5)
Glucose: 94 mg/dL (ref 65–99)
Potassium: 4.7 mmol/L (ref 3.5–5.2)
Sodium: 139 mmol/L (ref 134–144)
Total Protein: 7.6 g/dL (ref 6.0–8.5)

## 2017-11-21 LAB — LIPID PANEL
Chol/HDL Ratio: 5.3 ratio — ABNORMAL HIGH (ref 0.0–5.0)
Cholesterol, Total: 208 mg/dL — ABNORMAL HIGH (ref 100–199)
HDL: 39 mg/dL — ABNORMAL LOW (ref >39)
LDL Calculated: 115 mg/dL — ABNORMAL HIGH (ref 0–99)
Triglycerides: 271 mg/dL — ABNORMAL HIGH (ref 0–149)
VLDL Cholesterol Cal: 54 mg/dL — ABNORMAL HIGH (ref 5–40)

## 2017-11-21 LAB — CBC WITH DIFFERENTIAL/PLATELET
Basophils Absolute: 0 10*3/uL (ref 0.0–0.2)
Basos: 0 %
EOS (ABSOLUTE): 0.1 10*3/uL (ref 0.0–0.4)
Eos: 1 %
Hematocrit: 41 % (ref 37.5–51.0)
Hemoglobin: 14 g/dL (ref 13.0–17.7)
Immature Grans (Abs): 0 10*3/uL (ref 0.0–0.1)
Immature Granulocytes: 0 %
Lymphocytes Absolute: 3.6 10*3/uL — ABNORMAL HIGH (ref 0.7–3.1)
Lymphs: 42 %
MCH: 32.9 pg (ref 26.6–33.0)
MCHC: 34.1 g/dL (ref 31.5–35.7)
MCV: 96 fL (ref 79–97)
Monocytes Absolute: 0.5 10*3/uL (ref 0.1–0.9)
Monocytes: 6 %
Neutrophils Absolute: 4.2 10*3/uL (ref 1.4–7.0)
Neutrophils: 51 %
Platelets: 333 10*3/uL (ref 150–379)
RBC: 4.26 x10E6/uL (ref 4.14–5.80)
RDW: 14.5 % (ref 12.3–15.4)
WBC: 8.4 10*3/uL (ref 3.4–10.8)

## 2017-11-21 LAB — TSH: TSH: 2.17 u[IU]/mL (ref 0.450–4.500)

## 2017-12-09 ENCOUNTER — Telehealth: Payer: Self-pay | Admitting: Family Medicine

## 2017-12-09 NOTE — Telephone Encounter (Signed)
Spoke with patient and I scheduled him next week to discuss options with Dr. Tamala Julian. Patient is not satisfied with one month of relief from epidural.

## 2017-12-09 NOTE — Telephone Encounter (Signed)
Copied from [place] [id number]. Topic: Quick Communication - See Telephone Encounter >> Dec 09, 2017  9:50 AM Burnis Medin, NT wrote: CRM for notification. See Telephone encounter for: Patient called and said that he had two injections in his back and wanted to see what the next step was. Patient said he is still experiencing pain and wanted to see if the doctor could call him back.   12/09/17.

## 2017-12-14 NOTE — Progress Notes (Signed)
[facility] [place], [place] [address] Phone: [phone] Subjective:    I'm seeing this patient by the request  of:    CC: Back pain follow-up  [id number]  EDWORD CU is a 62 y.o. male coming in with complaint of back pain.  Found to have lumbar stenosis.  Was not responding well to the gabapentin.  And an MRI showing an L5 pars defect causing some spinal stenosis.  MRI that was independently visualized by me also showed severe bilateral L5-S1 foraminal stenosis.  Patient has had 2 epidurals done one on September 03, 2017 and another one October 02, 2017.  Patient states frequency of the exacerbation is not that bad. But when it occurs is very severe.  Still pain going down the leg.  Still has some numbness       Past Medical History:  Diagnosis Date  . Deviated septum 09/13/14   severe  . Duodenal ulcer   . Esophageal stricture   . GERD (gastroesophageal reflux disease)   . Hyperlipemia   . Leg length discrepancy   . PUD (peptic ulcer disease)   . Smoker    Past Surgical History:  Procedure Laterality Date  . COLONOSCOPY  2007  . KNEE ARTHROSCOPY    . UPPER GASTROINTESTINAL ENDOSCOPY  2015   Social History   Socioeconomic History  . Marital status: Married    Spouse name: None  . Number of children: None  . Years of education: None  . Highest education level: None  Social Needs  . Financial resource strain: None  . Food insecurity - worry: None  . Food insecurity - inability: None  . Transportation needs - medical: None  . Transportation needs - non-medical: None  Occupational History  . Occupation: Chartered certified accountant  Tobacco Use  . Smoking status: Former Smoker    Packs/day: 0.25    Types: Cigarettes    Last attempt to quit: 06/02/2016    Years since quitting: 1.5  . Smokeless tobacco: Never Used  . Tobacco comment: Smokes occ.Marland KitchenCounseling sheet given 01-2012   Substance and Sexual Activity  . Alcohol  use: Yes    Alcohol/week: 0.0 oz    Comment: occasional  . Drug use: No  . Sexual activity: Yes  Other Topics Concern  . None  Social History Narrative   Daily caffeine   Allergies  Allergen Reactions  . Dust Mite Extract    Family History  Problem Relation Age of Onset  . Colon polyps Mother   . Arthritis Mother   . Stroke Father   . Colon cancer Neg Hx   . Esophageal cancer Neg Hx   . Rectal cancer Neg Hx   . Stomach cancer Neg Hx      Past medical history, social, surgical and family history all reviewed in electronic medical record.  No pertanent information unless stated regarding to the chief complaint.   Review of Systems:Review of systems updated and as accurate as of 12/15/17  No headache, visual changes, nausea, vomiting, diarrhea, constipation, dizziness, abdominal pain, skin rash, fevers, chills, night sweats, weight loss, swollen lymph nodes, body aches, joint swelling, muscle aches, chest pain, shortness of breath, mood changes.   Objective  Blood pressure 116/70, pulse 96, height 6' (1.829 m), weight 186 lb (84.4 kg), SpO2 95 %. Systems examined below as of 12/15/17   General: No apparent distress alert and oriented x3 mood and affect normal, dressed appropriately.  HEENT: Pupils equal,  extraocular movements intact  Respiratory: Patient's speak in full sentences and does not appear short of breath  Cardiovascular: No lower extremity edema, non tender, no erythema  Skin: Warm dry intact with no signs of infection or rash on extremities or on axial skeleton.  Abdomen: Soft nontender  Neuro: Cranial nerves II through XII are intact, neurovascularly intact in all extremities with 2+ DTRs and 2+ pulses.  Lymph: No lymphadenopathy of posterior or anterior cervical chain or axillae bilaterally.  Gait normal with good balance and coordination.  MSK:  Non tender with full range of motion and good stability and symmetric strength and tone of shoulders, elbows, wrist,  hip, knee and ankles bilaterally.   Back Exam:  Inspection: Unremarkable  Motion: Flexion 35 deg, Extension 25 deg, Side Bending to 35 deg bilaterally,  Rotation to 35 deg bilaterally  SLR laying: Negative  XSLR laying: Negative  Palpable tenderness: Tender to palpation the paraspinal musculature lumbar spine right greater than left. FABER: Positive right. Sensory change: Gross sensation intact to all lumbar and sacral dermatomes.  Reflexes: 2+ at both patellar tendons, 2+ at achilles tendons, Babinski's downgoing.  Strength at foot  Plantar-flexion: 5/5 Dorsi-flexion: 5/5 Eversion: 5/5 Inversion: 5/5  Leg strength  Symmetric but 4 out of 5.    Impression and Recommendations:     This case required medical decision making of moderate complexity.      Note: This dictation was prepared with Dragon dictation along with smaller phrase technology. Any transcriptional errors that result from this process are unintentional.

## 2017-12-15 ENCOUNTER — Encounter: Payer: Self-pay | Admitting: Family Medicine

## 2017-12-15 ENCOUNTER — Ambulatory Visit (INDEPENDENT_AMBULATORY_CARE_PROVIDER_SITE_OTHER): Payer: PRIVATE HEALTH INSURANCE | Admitting: Family Medicine

## 2017-12-15 VITALS — BP 116/70 | HR 96 | Ht 72.0 in | Wt 186.0 lb

## 2017-12-15 DIAGNOSIS — M549 Dorsalgia, unspecified: Secondary | ICD-10-CM

## 2017-12-15 NOTE — Assessment & Plan Note (Signed)
Patient does have degenerative changes of the lumbar spine short causing some spinal stenosis.  Pars defect at L5-S1 is likely contributing to a right-sided S1 nerve impingement.  Patient could be a candidate for radiofrequency ablation if this injection does not help.  We will attempt another epidural and nerve root injection to see if this will be beneficial.  Will be referred to Dr. Maryjean Ka to discuss the possibility of radiofrequency ablation.  Follow-up with me again 2 weeks after the injection.

## 2017-12-23 ENCOUNTER — Ambulatory Visit: Payer: BLUE CROSS/BLUE SHIELD | Admitting: Family Medicine

## 2017-12-29 ENCOUNTER — Other Ambulatory Visit: Payer: PRIVATE HEALTH INSURANCE

## 2017-12-30 ENCOUNTER — Ambulatory Visit
Admission: RE | Admit: 2017-12-30 | Discharge: 2017-12-30 | Disposition: A | Discharge status: Home / Self Care | Payer: PRIVATE HEALTH INSURANCE | Source: Ambulatory Visit | Attending: Family Medicine | Admitting: Family Medicine

## 2017-12-30 ENCOUNTER — Other Ambulatory Visit: Payer: Self-pay | Admitting: Family Medicine

## 2017-12-30 DIAGNOSIS — M549 Dorsalgia, unspecified: Secondary | ICD-10-CM

## 2017-12-30 MED ORDER — METHYLPREDNISOLONE ACETATE 40 MG/ML INJ SUSP (RADIOLOG
120.0000 mg | Freq: Once | INTRAMUSCULAR | Status: AC
  Administered 2017-12-30: 120 mg via EPIDURAL

## 2017-12-30 MED ORDER — IOPAMIDOL (ISOVUE-M 200) INJECTION 41%
1.0000 mL | Freq: Once | INTRAMUSCULAR | Status: AC
  Administered 2017-12-30: 1 mL via EPIDURAL

## 2018-02-10 ENCOUNTER — Other Ambulatory Visit: Payer: Self-pay | Admitting: Family Medicine

## 2018-03-10 ENCOUNTER — Other Ambulatory Visit: Payer: Self-pay | Admitting: Family Medicine

## 2018-03-24 ENCOUNTER — Other Ambulatory Visit: Payer: Self-pay | Admitting: Family Medicine

## 2018-03-25 HISTORY — PX: BIOPSY SHOULDER: PRO31

## 2018-04-16 ENCOUNTER — Encounter: Payer: Self-pay | Admitting: Family Medicine

## 2018-04-23 ENCOUNTER — Other Ambulatory Visit: Payer: Self-pay | Admitting: Family Medicine

## 2018-04-23 NOTE — Telephone Encounter (Signed)
Refill done.  

## 2018-05-12 ENCOUNTER — Encounter: Payer: Commercial Indemnity | Admitting: Family Medicine

## 2018-05-12 ENCOUNTER — Other Ambulatory Visit: Payer: 59

## 2018-05-12 ENCOUNTER — Other Ambulatory Visit: Payer: Self-pay | Admitting: Medical

## 2018-05-12 DIAGNOSIS — Z0000 Encounter for general adult medical examination without abnormal findings: Secondary | ICD-10-CM

## 2018-05-13 LAB — CBC
Hematocrit: 40.7 % (ref 37.5–51.0)
Hemoglobin: 13.6 g/dL (ref 13.0–17.7)
MCH: 33 pg (ref 26.6–33.0)
MCHC: 33.4 g/dL (ref 31.5–35.7)
MCV: 99 fL — ABNORMAL HIGH (ref 79–97)
Platelets: 309 10*3/uL (ref 150–450)
RBC: 4.12 x10E6/uL — ABNORMAL LOW (ref 4.14–5.80)
RDW: 14.7 % (ref 12.3–15.4)
WBC: 6.5 10*3/uL (ref 3.4–10.8)

## 2018-05-13 LAB — PSA: Prostate Specific Ag, Serum: 0.4 ng/mL (ref 0.0–4.0)

## 2018-05-13 LAB — HEMOGLOBIN A1C
Est. average glucose Bld gHb Est-mCnc: 128 mg/dL
Hgb A1c MFr Bld: 6.1 % — ABNORMAL HIGH (ref 4.8–5.6)

## 2018-06-04 ENCOUNTER — Ambulatory Visit (INDEPENDENT_AMBULATORY_CARE_PROVIDER_SITE_OTHER): Payer: 59 | Admitting: Family Medicine

## 2018-06-04 ENCOUNTER — Encounter: Payer: Self-pay | Admitting: Family Medicine

## 2018-06-04 ENCOUNTER — Other Ambulatory Visit: Payer: Self-pay | Admitting: Family Medicine

## 2018-06-04 VITALS — BP 122/78 | HR 73 | Temp 98.3°F | Ht 72.75 in | Wt 187.2 lb

## 2018-06-04 DIAGNOSIS — M4300 Spondylolysis, site unspecified: Secondary | ICD-10-CM

## 2018-06-04 DIAGNOSIS — M9983 Other biomechanical lesions of lumbar region: Secondary | ICD-10-CM

## 2018-06-04 DIAGNOSIS — K219 Gastro-esophageal reflux disease without esophagitis: Secondary | ICD-10-CM

## 2018-06-04 DIAGNOSIS — M4807 Spinal stenosis, lumbosacral region: Secondary | ICD-10-CM | POA: Insufficient documentation

## 2018-06-04 DIAGNOSIS — E7439 Other disorders of intestinal carbohydrate absorption: Secondary | ICD-10-CM | POA: Insufficient documentation

## 2018-06-04 DIAGNOSIS — M4310 Spondylolisthesis, site unspecified: Secondary | ICD-10-CM

## 2018-06-04 DIAGNOSIS — Z0000 Encounter for general adult medical examination without abnormal findings: Secondary | ICD-10-CM

## 2018-06-04 DIAGNOSIS — J209 Acute bronchitis, unspecified: Secondary | ICD-10-CM

## 2018-06-04 DIAGNOSIS — Z87891 Personal history of nicotine dependence: Secondary | ICD-10-CM

## 2018-06-04 LAB — POCT URINALYSIS DIP (PROADVANTAGE DEVICE)
Bilirubin, UA: NEGATIVE
Blood, UA: NEGATIVE
Glucose, UA: NEGATIVE mg/dL
Ketones, POC UA: NEGATIVE mg/dL
Leukocytes, UA: NEGATIVE
Nitrite, UA: NEGATIVE
Protein Ur, POC: NEGATIVE mg/dL
Specific Gravity, Urine: 1.025
Urobilinogen, Ur: 3.5
pH, UA: 6 (ref 5.0–8.0)

## 2018-06-04 MED ORDER — AMOXICILLIN 875 MG PO TABS: 875.0000 mg | ORAL_TABLET | Freq: Two times a day (BID) | ORAL | 0 refills | Status: DC

## 2018-06-04 NOTE — Progress Notes (Signed)
Subjective:    Patient ID: Gerald [name], male    DOB: 08-12-1956, 62 y.o.   MRN: [id number]  HPI He is here for complete examination.  He has had difficulty recently with back pain and subsequently was found out to have a pars defect as well as neuroforaminal stenosis secondary to that.  He has had several epidural injections and has been taking Neurontin.  He recently stopped the Neurontin thinking it was not really giving him much benefit.  He also has underlying reflux disease and does use his PPI on an as-needed basis.  He is a former smoker.  He also has a 10-day history of nasal congestion with purulent rhinorrhea, no cough, sore throat, fever, chills or earache.  He is getting me to start a new job and is looking forward to that.  His home life is going well.  Family and social history as well as health maintenance and immunizations was reviewed.   Review of Systems  All other systems reviewed and are negative.      Objective:   Physical Exam BP 122/78 (BP Location: Left Arm, Patient Position: Sitting)   Pulse 73   Temp 98.3 F (36.8 C)   Ht 6' 0.75" (1.848 m)   Wt 187 lb 3.2 oz (84.9 kg)   SpO2 96%   BMI 24.87 kg/m   General Appearance:    Alert, cooperative, no distress, appears stated age  Head:    Normocephalic, without obvious abnormality, atraumatic  Eyes:    PERRL, conjunctiva/corneas clear, EOM's intact, fundi    benign  Ears:    Normal TM's and external ear canals  Nose:   Nares normal, mucosa normal, no drainage or sinus   tenderness  Throat:   Lips, mucosa, and tongue normal; teeth and gums normal  Neck:   Supple, no lymphadenopathy;  thyroid:  no   enlargement/tenderness/nodules; no carotid   bruit or JVD     Lungs:     Clear to auscultation bilaterally without wheezes, rales or     ronchi; respirations unlabored      Heart:    Regular rate and rhythm, S1 and S2 normal, no murmur, rub   or gallop     Abdomen:     Soft, non-tender, nondistended,  normoactive bowel sounds,    no masses, no hepatosplenomegaly  Genitalia:   Deferred  Rectal:   Deferred  Extremities:   No clubbing, cyanosis or edema  Pulses:   2+ and symmetric all extremities  Skin:   Skin color, texture, turgor normal, no rashes or lesions  Lymph nodes:   Cervical, supraclavicular, and axillary nodes normal  Neurologic:   CNII-XII intact, normal strength, sensation and gait; reflexes 2+ and symmetric throughout          Psych:   Normal mood, affect, hygiene and grooming.    Blood work was reviewed.  Hb A1c is 6.1      Assessment & Plan:  Routine general medical examination at a health care facility - Plan: POCT Urinalysis DIP (Proadvantage Device)  Former smoker  Gastroesophageal reflux disease, esophagitis presence not specified  Pars defect with spondylolisthesis  Neuroforaminal stenosis of lumbosacral spine  Acute bronchitis, unspecified organism He seems to be doing fairly well with his back pain and the epidural injections.  I discussed the Neurontin with him and since he seems to be having minimal difficulty with the numbness in the L5 distribution, I said Neurontin is negotiable.  Encouraged him to continue  use of PPI on an as-needed basis.  He will call me if he does not get entirely better with the antibiotic.

## 2018-06-04 NOTE — Telephone Encounter (Signed)
Refill done.  

## 2018-07-01 ENCOUNTER — Other Ambulatory Visit: Payer: Self-pay | Admitting: Family Medicine

## 2018-08-03 DIAGNOSIS — M47817 Spondylosis without myelopathy or radiculopathy, lumbosacral region: Secondary | ICD-10-CM | POA: Diagnosis not present

## 2018-08-03 DIAGNOSIS — Z6825 Body mass index (BMI) 25.0-25.9, adult: Secondary | ICD-10-CM | POA: Diagnosis not present

## 2018-08-03 DIAGNOSIS — R030 Elevated blood-pressure reading, without diagnosis of hypertension: Secondary | ICD-10-CM | POA: Diagnosis not present

## 2018-08-12 IMAGING — US US ABDOMEN LIMITED
1 series · 14 of 25 positions shown · non-contrast
Comparison: 10/07/2015

CLINICAL DATA: Weight loss, abdominal bloating

EXAM:
ULTRASOUND ABDOMEN LIMITED RIGHT UPPER QUADRANT

[Series 1: us abdomen limited · 0.19mm/px · 14 of 39 slices shown]
[im 1/39]
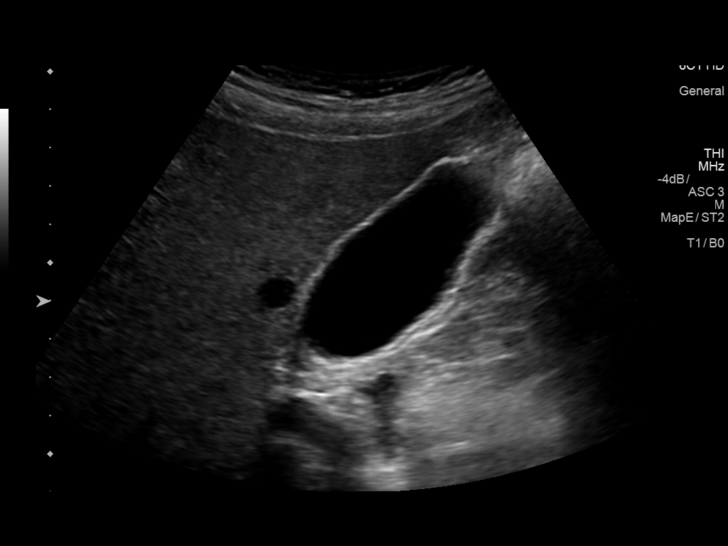
[im 4/39]
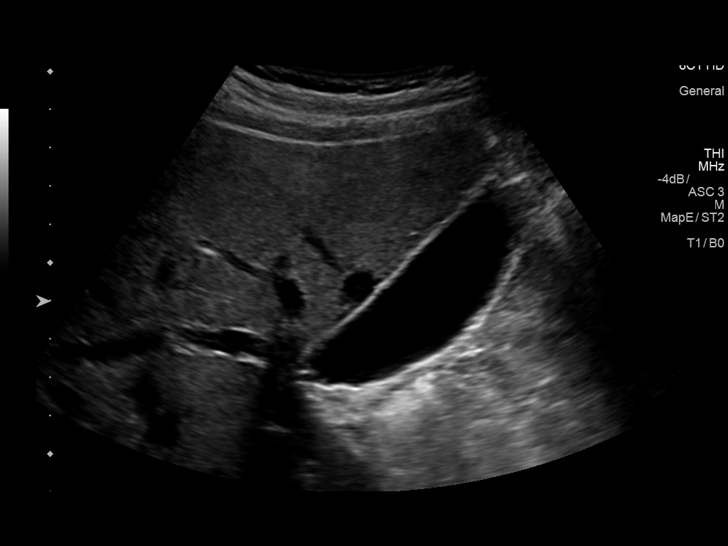
[im 7/39]
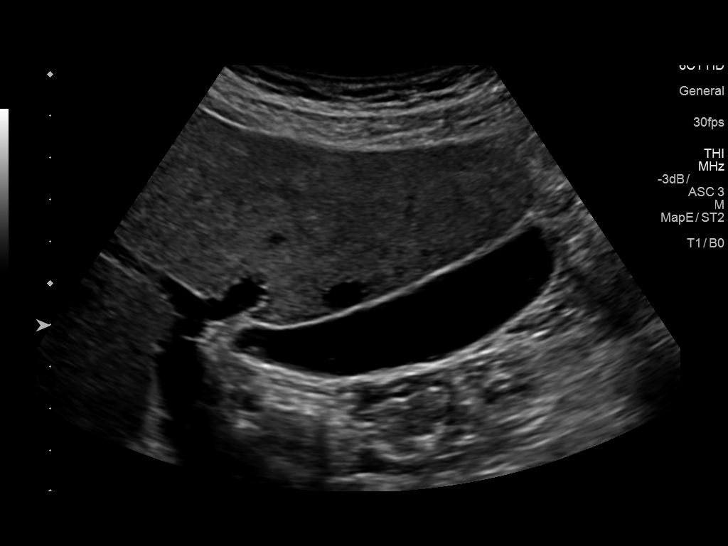
[im 10/39]
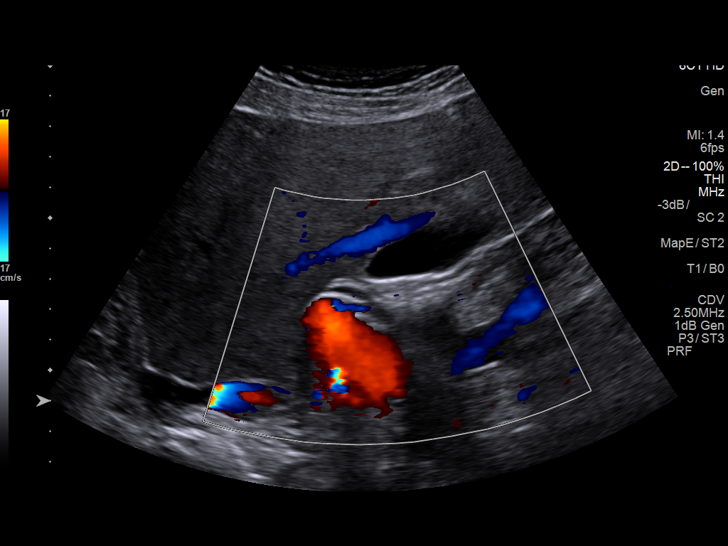
[im 13/39]
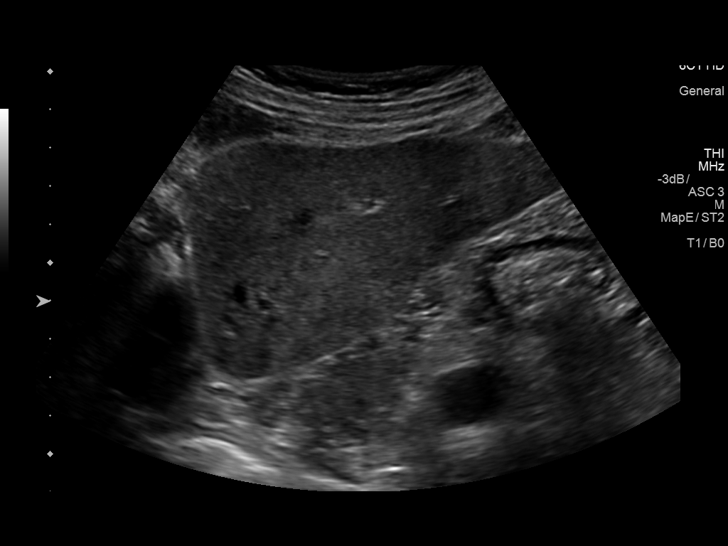
[im 15/39]
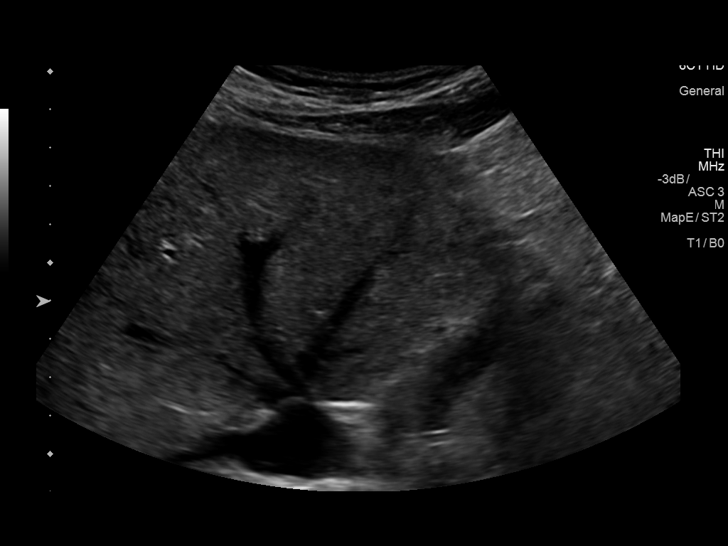
[im 18/39]
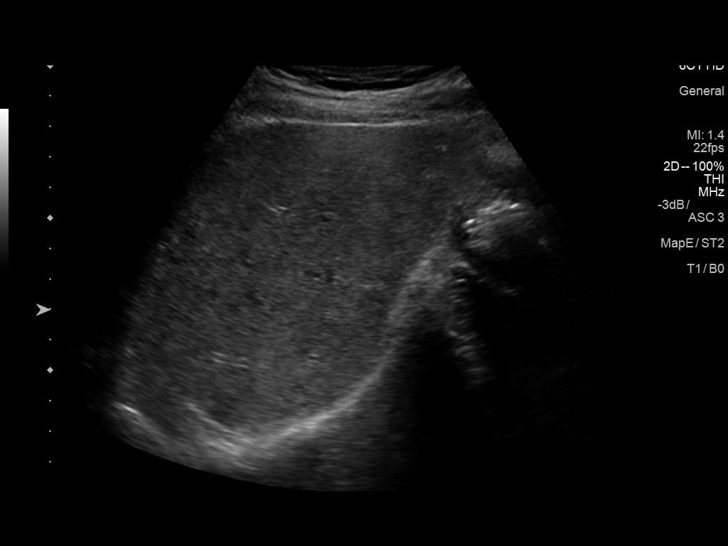
[im 21/39]
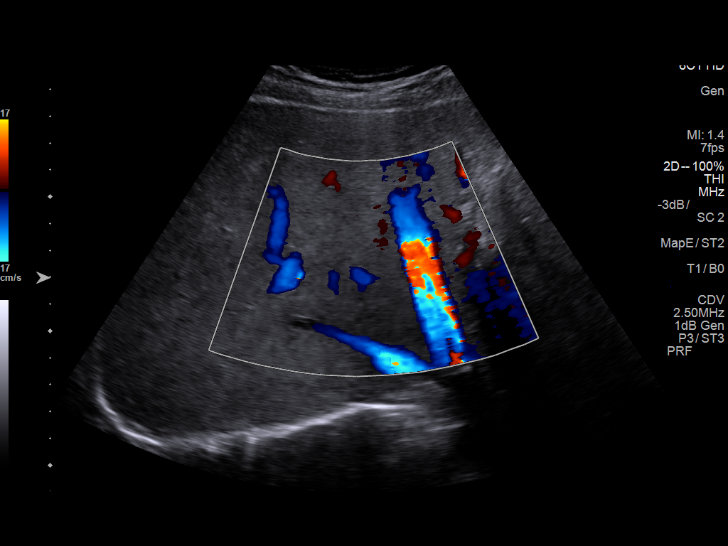
[im 24/39]
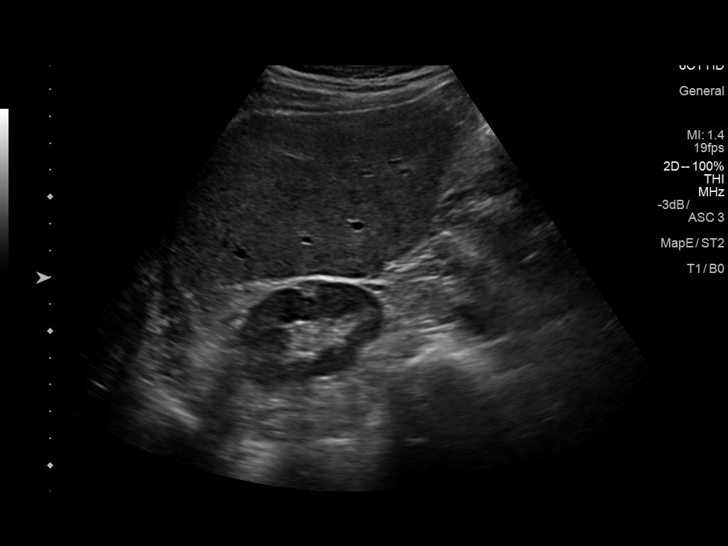
[im 26/39]
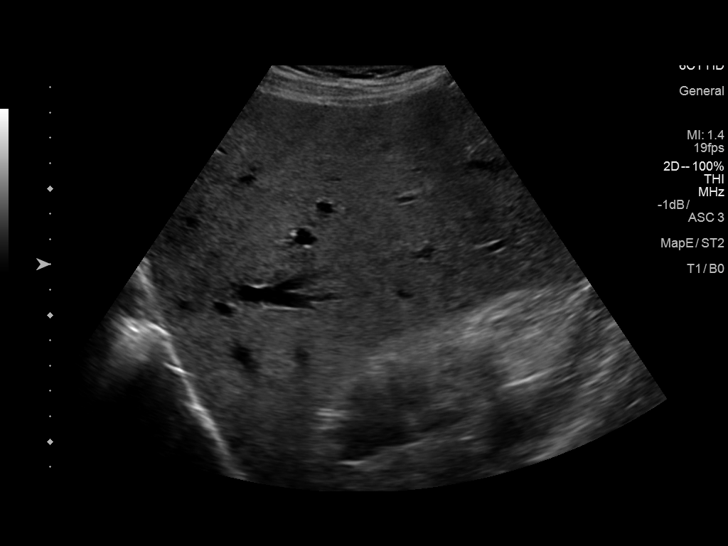
[im 29/39]
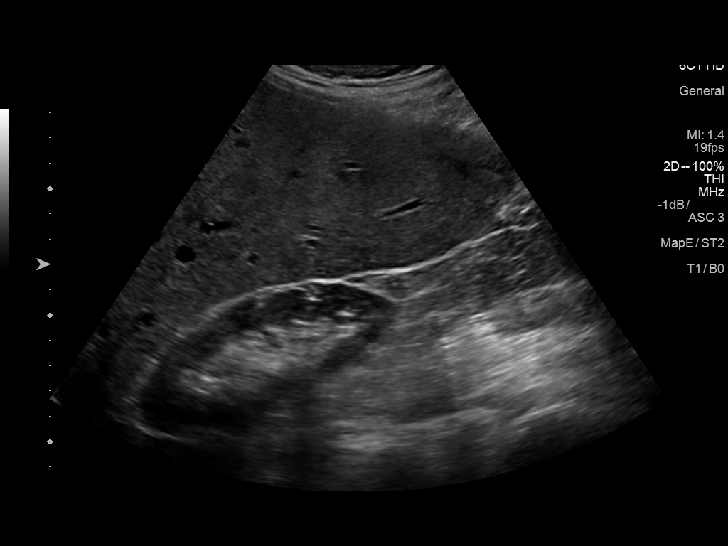
[im 32/39]
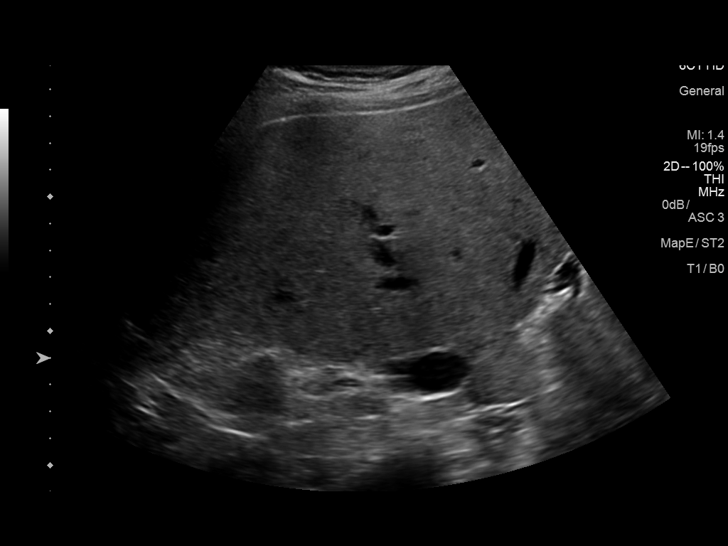
[im 35/39]
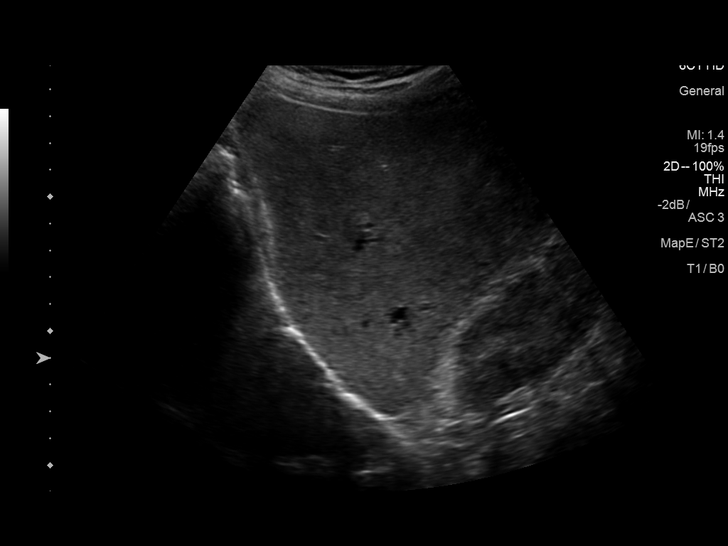
[im 39/39]
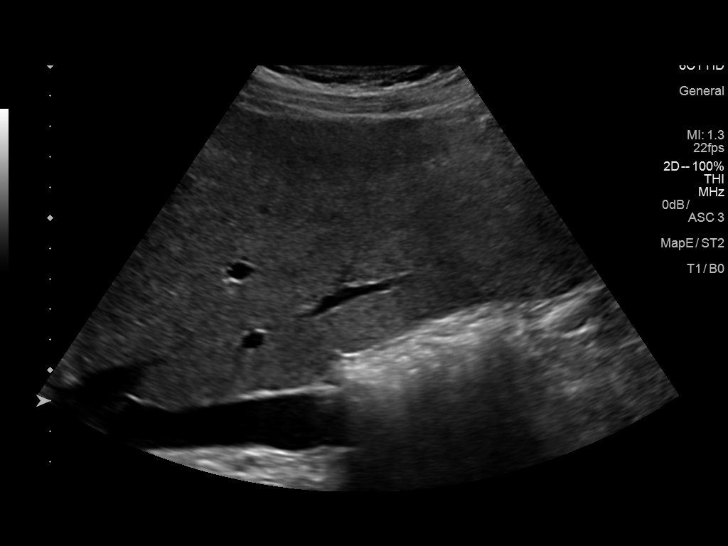

[14 of 25 positions shown; findings below may reference images not displayed]

FINDINGS: Gallbladder:

No gallstones or wall thickening visualized. No sonographic Murphy
sign noted by sonographer.

Common bile duct:

Diameter: 3.1 mm

Liver:

No focal lesion identified. Within normal limits in parenchymal
echogenicity. Patent portal vein with normal hepatopetal flow.

No upper abdominal free fluid or ascites.

Included views the right kidney demonstrate no hydronephrosis.
IMPRESSION: Normal right upper quadrant ultrasound

## 2018-08-22 ENCOUNTER — Telehealth: Payer: Self-pay | Admitting: Family Medicine

## 2018-08-22 NOTE — Telephone Encounter (Signed)
P.A. Jonna Munro completed thru [facility] and pt was inactive.  Called [facility] and pt has [employer] t# [phone] ID # [id number] need to know if pt has tried Aciphex and Deixlant.  Left message for pt

## 2018-08-24 MED ORDER — ESOMEPRAZOLE MAGNESIUM 40 MG PO CPDR: DELAYED_RELEASE_CAPSULE | ORAL | 2 refills | Status: DC

## 2018-08-24 NOTE — Telephone Encounter (Signed)
Nexium denied, ok to switch to Dexilant per Dr. Redmond School.  Called pharmacy and went thru for $103 per month.  Activated discount card and went thru for $75 for 90 days.  Also gave pt option of Good Rx at [facility] for around $18, he chose that option and I called in for pt to [facility] t# [phone] ID# [id number] group# [id number] BIN# [id number] PCN# [id number]

## 2018-09-07 DIAGNOSIS — M47817 Spondylosis without myelopathy or radiculopathy, lumbosacral region: Secondary | ICD-10-CM | POA: Diagnosis not present

## 2018-09-11 DIAGNOSIS — Z23 Encounter for immunization: Secondary | ICD-10-CM | POA: Diagnosis not present

## 2018-09-29 DIAGNOSIS — M47817 Spondylosis without myelopathy or radiculopathy, lumbosacral region: Secondary | ICD-10-CM | POA: Diagnosis not present

## 2018-11-07 ENCOUNTER — Other Ambulatory Visit: Payer: Self-pay | Admitting: Family Medicine

## 2018-11-16 ENCOUNTER — Other Ambulatory Visit: Payer: Self-pay | Admitting: Family Medicine

## 2018-11-16 NOTE — Telephone Encounter (Signed)
Pt says he is taking dexilant with no issues. Please advise if this is ok to fill due to it not being on his med list. [facility]

## 2019-01-14 IMAGING — DX DG LUMBAR SPINE COMPLETE 4+V
5 series · 5 of 5 positions shown · non-contrast
Comparison: 10/07/2015 CT.

CLINICAL DATA: 60-year-old male with low back pain for 5 years.
Right-sided sciatica. No known injury. Initial encounter.

EXAM:
LUMBAR SPINE - COMPLETE 4+ VIEW

[l-spine ap]
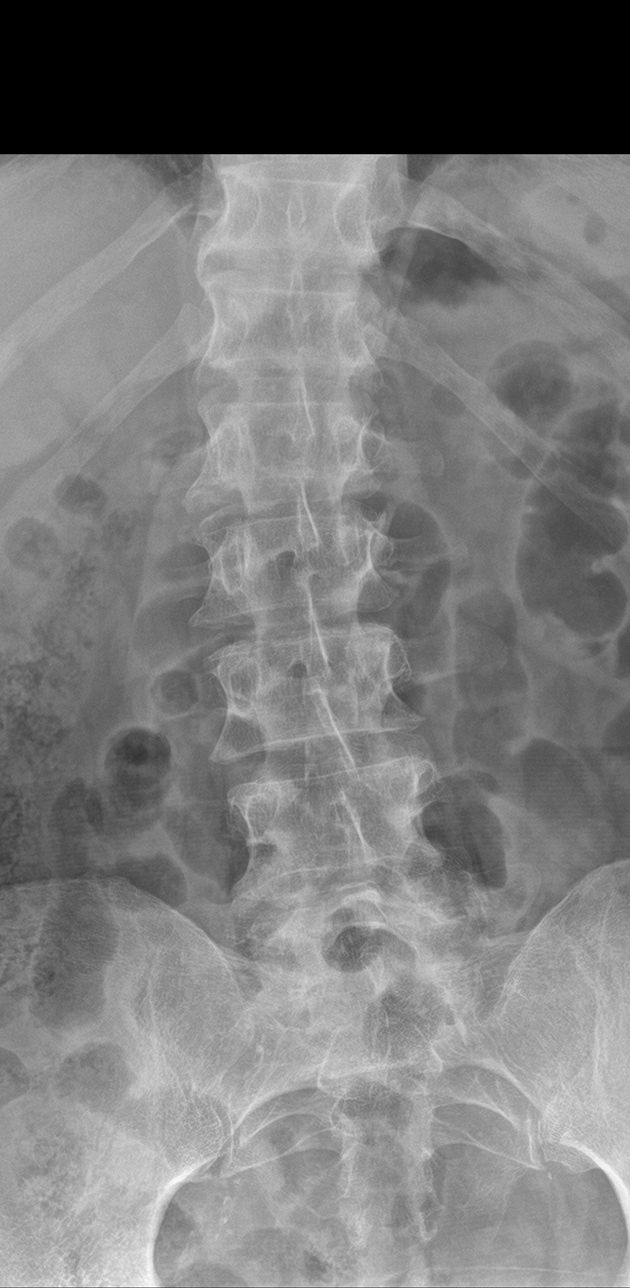

[l-spine obl (1 of 2)]
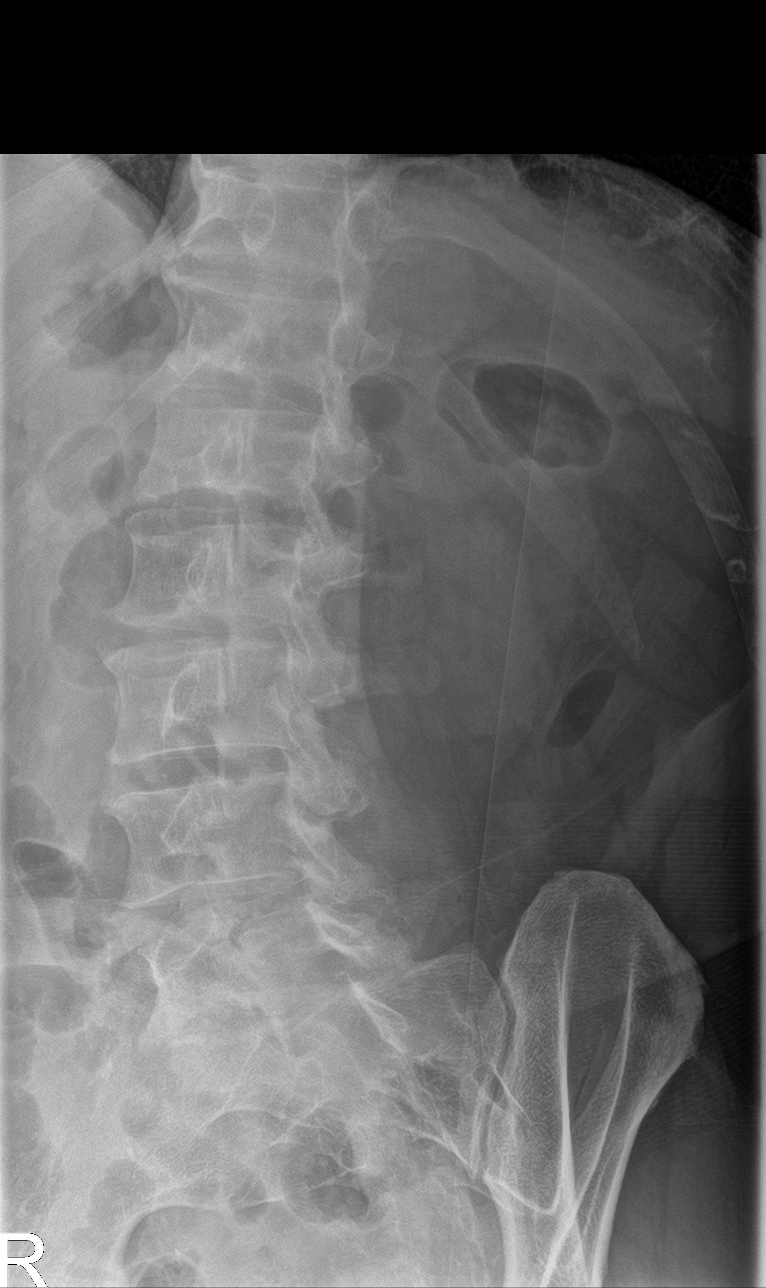

[l-spine obl (2 of 2)]
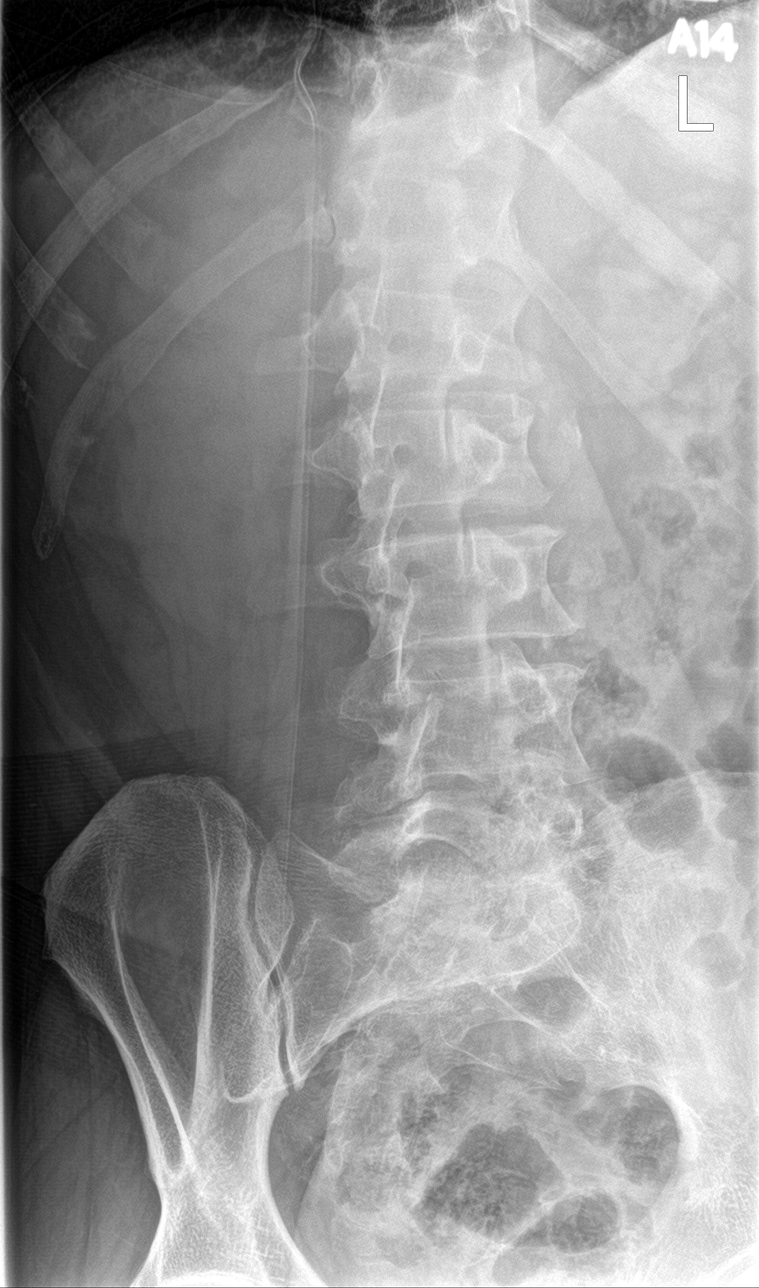

[l-spine lat]
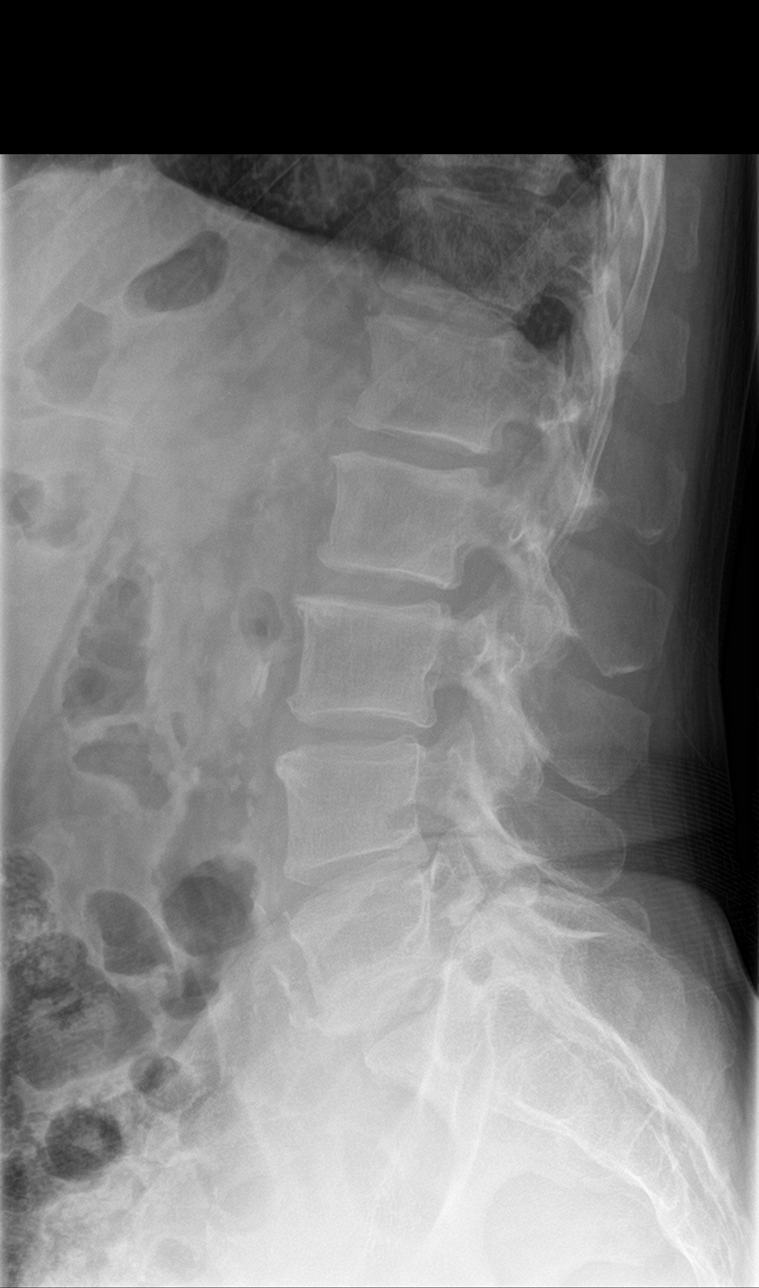

[l-spine spot]
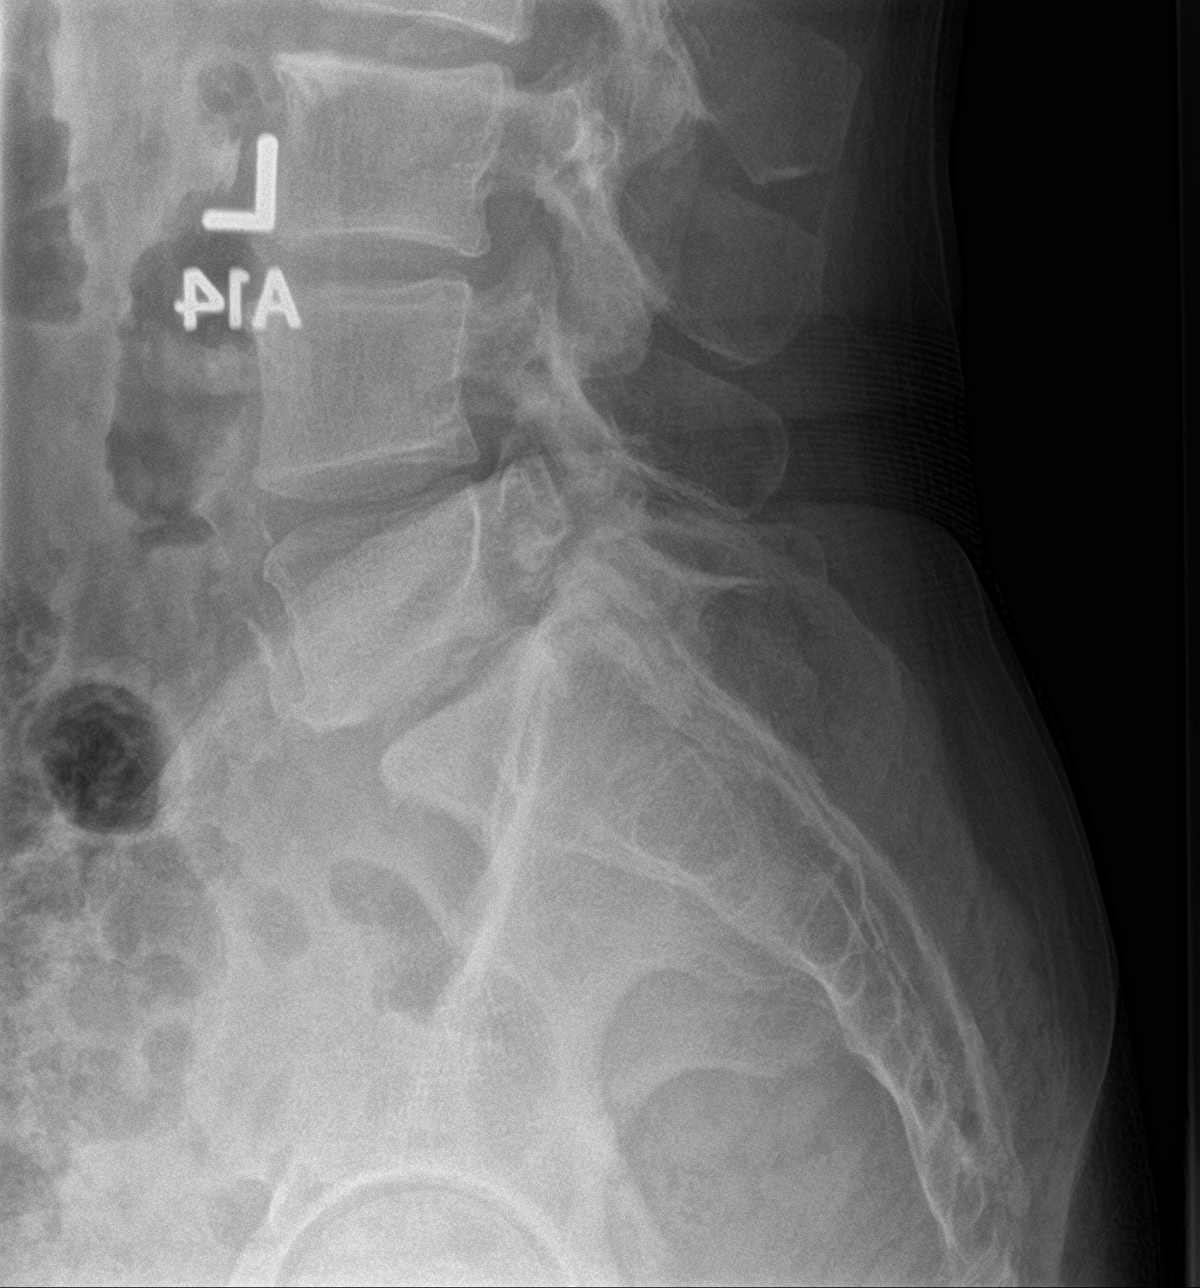

[5 of 5 positions shown; findings below may reference images not displayed]

FINDINGS: Mild scoliosis convex right.

L5 bilateral pars defects with minimal anterior slip L5 and mild
L5-S1 disc space narrowing.

Very mild L4-5 disc space narrowing.

Vascular calcifications
IMPRESSION: Bilateral L5 pars defects with minimal anterior slip L5. Mild L5-S1
disc space narrowing.

Very mild L4-5 disc space narrowing.

Mild scoliosis convex right.

Aortic atherosclerosis.

## 2019-01-18 ENCOUNTER — Other Ambulatory Visit: Payer: Self-pay | Admitting: Family Medicine

## 2019-03-10 ENCOUNTER — Other Ambulatory Visit: Payer: Self-pay | Admitting: Family Medicine

## 2019-03-15 NOTE — Telephone Encounter (Signed)
Appointment on Friday with Dr. Tamala Julian to discuss refill.

## 2019-03-18 NOTE — Progress Notes (Signed)
[facility] [address] [place], [place] [address] Phone: [phone] Subjective:    Virtual Visit via Video Note  I connected with Gerald [name] on 03/19/19 at  8:15 AM EDT by a video enabled telemedicine application and verified that I am speaking with the correct person using two identifiers.  Location: Patient: In home setting Provider: None office setting  I discussed the limitations of evaluation and management by telemedicine and the availability of in person appointments. The patient expressed understanding and agreed to proceed.     I discussed the assessment and treatment plan with the patient. The patient was provided an opportunity to ask questions and all were answered. The patient agreed with the plan and demonstrated an understanding of the instructions.   The patient was advised to call back or seek an in-person evaluation if the symptoms worsen or if the condition fails to improve as anticipated.  I provided 17 minutes of face-to-face time during this encounter.   Lyndal Pulley, DO    CC: Back pain follow-up  [id number]  Gerald [name] is a 63 y.o. male coming in with complaint of low back pain.  Known lumbar spinal stenosis.  Patient does have an L5 pars defect and causes some nerve impingement.  Has responded intermittently to epidurals.  Patient wanted to avoid surgical intervention.  Has been doing relatively well with gabapentin and diclofenac.  No side effects to either the medicines.  History though of duodenal ulcer and esophageal strictures.     Past Medical History:  Diagnosis Date  . Deviated septum 09/13/14   severe  . Duodenal ulcer   . Esophageal stricture   . GERD (gastroesophageal reflux disease)   . Hyperlipemia   . Leg length discrepancy   . PUD (peptic ulcer disease)   . Smoker    Past Surgical History:  Procedure Laterality Date  . BIOPSY SHOULDER Bilateral 03/25/2018   superficial basal  cell carcinoma  . COLONOSCOPY  2007  . KNEE ARTHROSCOPY    . UPPER GASTROINTESTINAL ENDOSCOPY  2015   Social History   Socioeconomic History  . Marital status: Married    Spouse name: Not on file  . Number of children: Not on file  . Years of education: Not on file  . Highest education level: Not on file  Occupational History  . Occupation: Chartered certified accountant  Social Needs  . Financial resource strain: Not on file  . Food insecurity:    Worry: Not on file    Inability: Not on file  . Transportation needs:    Medical: Not on file    Non-medical: Not on file  Tobacco Use  . Smoking status: Former Smoker    Packs/day: 0.25    Types: Cigarettes    Last attempt to quit: 06/02/2016    Years since quitting: 2.7  . Smokeless tobacco: Never Used  . Tobacco comment: Smokes occ.Marland KitchenCounseling sheet given 01-2012   Substance and Sexual Activity  . Alcohol use: Yes    Alcohol/week: 0.0 standard drinks    Comment: occasional  . Drug use: No  . Sexual activity: Yes  Lifestyle  . Physical activity:    Days per week: Not on file    Minutes per session: Not on file  . Stress: Not on file  Relationships  . Social connections:    Talks on phone: Not on file    Gets together: Not on file    Attends religious service: Not  on file    Active member of club or organization: Not on file    Attends meetings of clubs or organizations: Not on file    Relationship status: Not on file  Other Topics Concern  . Not on file  Social History Narrative   Daily caffeine   Allergies  Allergen Reactions  . Dust Mite Extract    Family History  Problem Relation Age of Onset  . Colon polyps Mother   . Arthritis Mother   . Stroke Father   . Colon cancer Neg Hx   . Esophageal cancer Neg Hx   . Rectal cancer Neg Hx   . Stomach cancer Neg Hx           Current Outpatient Medications (Analgesics):  .  diclofenac (VOLTAREN) 75 MG EC tablet, Take 1 tablet (75 mg total) by mouth 2 (two) times  daily.     Current Outpatient Medications (Other):  .  amoxicillin (AMOXIL) 875 MG tablet, Take 1 tablet (875 mg total) by mouth 2 (two) times daily. Marland Kitchen  DEXILANT 30 MG capsule, TAKE 1 CAPSULE BY MOUTH EVERY DAY .  esomeprazole (NEXIUM) 40 MG capsule, TAKE 1 CAPSULE BY MOUTH ONCE DAILY AT 12 NOON .  gabapentin (NEURONTIN) 300 MG capsule, TAKE 1 CAPSULE BY MOUTH EVERYDAY AT BEDTIME .  gabapentin (NEURONTIN) 300 MG capsule, 1 pill nightly  Current Facility-Administered Medications (Other):  .  0.9 %  sodium chloride infusion    Past medical history, social, surgical and family history all reviewed in electronic medical record.  No pertanent information unless stated regarding to the chief complaint.   Review of Systems:  No headache, visual changes, nausea, vomiting, diarrhea, constipation, dizziness, abdominal pain, skin rash, fevers, chills, night sweats, weight loss, swollen lymph nodes, body aches, joint swelling, , chest pain, shortness of breath, mood changes.  Mild positive muscle aches  Objective    General: No apparent distress alert and oriented x3 mood and affect normal, dressed appropriately.  HEENT: Pupils equal, extraocular movements intact  .     Impression and Recommendations:     This case required medical decision making of moderate complexity. The above documentation has been reviewed and is accurate and complete Lyndal Pulley, DO       Note: This dictation was prepared with Dragon dictation along with smaller phrase technology. Any transcriptional errors that result from this process are unintentional.

## 2019-03-19 ENCOUNTER — Encounter: Payer: Self-pay | Admitting: Family Medicine

## 2019-03-19 ENCOUNTER — Ambulatory Visit (INDEPENDENT_AMBULATORY_CARE_PROVIDER_SITE_OTHER): Payer: PRIVATE HEALTH INSURANCE | Admitting: Family Medicine

## 2019-03-19 DIAGNOSIS — M48061 Spinal stenosis, lumbar region without neurogenic claudication: Secondary | ICD-10-CM

## 2019-03-19 MED ORDER — DICLOFENAC SODIUM 75 MG PO TBEC: 75.0000 mg | DELAYED_RELEASE_TABLET | Freq: Two times a day (BID) | ORAL | 1 refills | Status: DC

## 2019-03-19 MED ORDER — GABAPENTIN 300 MG PO CAPS: ORAL_CAPSULE | ORAL | 3 refills | Status: DC

## 2019-03-19 NOTE — Assessment & Plan Note (Signed)
Doing well with conservative therapy.  Refilled gabapentin for another year supply.  Diclofenac also given.  Patient will try this and may need to consider changing to a different medications.  History of duodenal ulcers.  Warned of potential any stomach as well as kidney issues.  Patient will monitor.  Patient will follow-up with me if any worsening symptoms or at least on an annual basis.

## 2019-03-25 ENCOUNTER — Telehealth: Payer: Self-pay | Admitting: Family Medicine

## 2019-03-25 ENCOUNTER — Other Ambulatory Visit: Payer: Self-pay | Admitting: Medical

## 2019-03-25 MED ORDER — ESOMEPRAZOLE MAGNESIUM 40 MG PO CPDR: 40.0000 mg | DELAYED_RELEASE_CAPSULE | Freq: Every day | ORAL | 0 refills | Status: DC

## 2019-03-25 MED ORDER — ESOMEPRAZOLE MAGNESIUM 20 MG PO CPDR: 20.0000 mg | DELAYED_RELEASE_CAPSULE | Freq: Every day | ORAL | 3 refills | Status: DC

## 2019-03-25 NOTE — Telephone Encounter (Signed)
I sent Esomeprazole 40mg  which would be more equivalent to Dexilant or prescription strength, instead of the 20mg  tablet.    If still expensive let us know.

## 2019-03-25 NOTE — Telephone Encounter (Signed)
Pt's Dexilant was covered but cost is $231 for 30 days and $800 for 90.  Pt wants to switch to Esomeprazole 20mg  at [place] for cash pay

## 2019-06-21 ENCOUNTER — Encounter: Payer: Self-pay | Admitting: Family Medicine

## 2019-06-21 ENCOUNTER — Ambulatory Visit (INDEPENDENT_AMBULATORY_CARE_PROVIDER_SITE_OTHER): Payer: 59 | Admitting: Family Medicine

## 2019-06-21 ENCOUNTER — Other Ambulatory Visit: Payer: Self-pay

## 2019-06-21 VITALS — BP 122/78 | HR 88 | Temp 98.2°F | Ht 72.0 in | Wt 186.4 lb

## 2019-06-21 DIAGNOSIS — Z0000 Encounter for general adult medical examination without abnormal findings: Secondary | ICD-10-CM

## 2019-06-21 DIAGNOSIS — Z86010 Personal history of colonic polyps: Secondary | ICD-10-CM

## 2019-06-21 DIAGNOSIS — M48061 Spinal stenosis, lumbar region without neurogenic claudication: Secondary | ICD-10-CM | POA: Diagnosis not present

## 2019-06-21 DIAGNOSIS — K219 Gastro-esophageal reflux disease without esophagitis: Secondary | ICD-10-CM | POA: Diagnosis not present

## 2019-06-21 DIAGNOSIS — M4310 Spondylolisthesis, site unspecified: Secondary | ICD-10-CM

## 2019-06-21 DIAGNOSIS — M9983 Other biomechanical lesions of lumbar region: Secondary | ICD-10-CM

## 2019-06-21 DIAGNOSIS — M4300 Spondylolysis, site unspecified: Secondary | ICD-10-CM

## 2019-06-21 DIAGNOSIS — E7439 Other disorders of intestinal carbohydrate absorption: Secondary | ICD-10-CM

## 2019-06-21 DIAGNOSIS — Z23 Encounter for immunization: Secondary | ICD-10-CM

## 2019-06-21 DIAGNOSIS — M4807 Spinal stenosis, lumbosacral region: Secondary | ICD-10-CM

## 2019-06-21 DIAGNOSIS — M999 Biomechanical lesion, unspecified: Secondary | ICD-10-CM | POA: Diagnosis not present

## 2019-06-21 NOTE — Progress Notes (Signed)
   Subjective:    Patient ID: Gerald [name], male    DOB: 1956-04-13, 63 y.o.   MRN: [id number]  HPI He is here for complete examination.  He does have a history of reflux disease and is doing well on his Nexium.  He is also had some difficulty with his back and was found to have spondylolisthesis as well as neuroforaminal stenosis.  He has had epidural injections but at this point is doing well on Neurontin as well as diclofenac.  This keeps his discomfort under very good control.  He has started smoking again but is only smoking roughly 3 cigarettes/day.  He also has a history of colonic polyps and is scheduled for follow-up later this year .  He is now retired and seems to be handling this quite nicely.  His marriage is going well.  Family and social history as well as health maintenance and immunizations was reviewed.   Review of Systems  All other systems reviewed and are negative.      Objective:   Physical Exam Alert and in no distress. Tympanic membranes and canals are normal. Pharyngeal area is normal. Neck is supple without adenopathy or thyromegaly. Cardiac exam shows a regular sinus rhythm without murmurs or gallops. Lungs are clear to auscultation.  Abdominal exam shows no hepatosplenomegaly, masses or tenderness with normal bowel sounds        Assessment & Plan:   Encounter Diagnoses  Name Primary?  . Gastroesophageal reflux disease, esophagitis presence not specified   . Degenerative lumbar spinal stenosis   . Nonallopathic lesion of sacral region   . Neuroforaminal stenosis of lumbosacral spine   . Glucose intolerance   . Pars defect with spondylolisthesis   . Need for Tdap vaccination   . Routine general medical examination at a health care facility Yes  . History of colonic polyps   He will continue on his present medication regimen.  Follow-up with GI as planned.  Continue on Nexium, diclofenac and gabapentin.

## 2019-06-22 ENCOUNTER — Other Ambulatory Visit: Payer: Self-pay

## 2019-06-22 DIAGNOSIS — Z13220 Encounter for screening for lipoid disorders: Secondary | ICD-10-CM

## 2019-06-22 LAB — COMPREHENSIVE METABOLIC PANEL
ALT: 17 IU/L (ref 0–44)
AST: 12 IU/L (ref 0–40)
Albumin/Globulin Ratio: 2 (ref 1.2–2.2)
Albumin: 4.3 g/dL (ref 3.8–4.8)
Alkaline Phosphatase: 77 IU/L (ref 39–117)
BUN/Creatinine Ratio: 18 (ref 10–24)
BUN: 19 mg/dL (ref 8–27)
Bilirubin Total: <0.2 mg/dL (ref 0.0–1.2)
CO2: 24 mmol/L (ref 20–29)
Calcium: 9.2 mg/dL (ref 8.6–10.2)
Chloride: 105 mmol/L (ref 96–106)
Creatinine, Ser: 1.07 mg/dL (ref 0.76–1.27)
GFR calc Af Amer: 85 mL/min/{1.73_m2} (ref >59)
GFR calc non Af Amer: 73 mL/min/{1.73_m2} (ref >59)
Globulin, Total: 2.2 g/dL (ref 1.5–4.5)
Glucose: 98 mg/dL (ref 65–99)
Potassium: 4.9 mmol/L (ref 3.5–5.2)
Sodium: 142 mmol/L (ref 134–144)
Total Protein: 6.5 g/dL (ref 6.0–8.5)

## 2019-06-22 LAB — LIPID PANEL
Chol/HDL Ratio: 7.2 ratio — ABNORMAL HIGH (ref 0.0–5.0)
Cholesterol, Total: 223 mg/dL — ABNORMAL HIGH (ref 100–199)
HDL: 31 mg/dL — ABNORMAL LOW (ref >39)
Triglycerides: 448 mg/dL — ABNORMAL HIGH (ref 0–149)

## 2019-06-22 LAB — CBC WITH DIFFERENTIAL/PLATELET
Basophils Absolute: 0.1 10*3/uL (ref 0.0–0.2)
Basos: 1 %
EOS (ABSOLUTE): 0.2 10*3/uL (ref 0.0–0.4)
Eos: 3 %
Hematocrit: 39.7 % (ref 37.5–51.0)
Hemoglobin: 13.7 g/dL (ref 13.0–17.7)
Immature Grans (Abs): 0 10*3/uL (ref 0.0–0.1)
Immature Granulocytes: 0 %
Lymphocytes Absolute: 3.1 10*3/uL (ref 0.7–3.1)
Lymphs: 42 %
MCH: 33.8 pg — ABNORMAL HIGH (ref 26.6–33.0)
MCHC: 34.5 g/dL (ref 31.5–35.7)
MCV: 98 fL — ABNORMAL HIGH (ref 79–97)
Monocytes Absolute: 0.5 10*3/uL (ref 0.1–0.9)
Monocytes: 7 %
Neutrophils Absolute: 3.6 10*3/uL (ref 1.4–7.0)
Neutrophils: 47 %
Platelets: 308 10*3/uL (ref 150–450)
RBC: 4.05 x10E6/uL — ABNORMAL LOW (ref 4.14–5.80)
RDW: 13.5 % (ref 11.6–15.4)
WBC: 7.5 10*3/uL (ref 3.4–10.8)

## 2019-07-09 ENCOUNTER — Other Ambulatory Visit: Payer: Self-pay | Admitting: Family Medicine

## 2019-07-09 NOTE — Telephone Encounter (Signed)
Requested medication (s) are due for refill today: {Yes/yes Requested medication (s) are on the active medication list: yes  Last refill:  03/19/2019  Future visit scheduled:yes  Notes to clinic:  Ordering provider and pcp are different   Requested Prescriptions  Pending Prescriptions Disp Refills   gabapentin (NEURONTIN) 300 MG capsule 90 capsule 3    Sig: 1 pill nightly     Neurology: Anticonvulsants - gabapentin Passed - 07/09/2019 11:36 AM      Passed - Valid encounter within last 12 months    Recent Outpatient Visits          3 months ago Degenerative lumbar spinal stenosis   [place], Rachel, DO   1 year ago Back pain, unspecified back location, unspecified back pain laterality, unspecified chronicity   [place], [place], DO   2 years ago Lumbar radiculopathy   [place], [place], DO   2 years ago Right hip pain   [place], Olevia Bowens, DO   2 years ago Nonallopathic lesion of sacral region   [place], Zachary M, DO              diclofenac (VOLTAREN) 75 MG EC tablet 180 tablet 1    Sig: Take 1 tablet (75 mg total) by mouth 2 (two) times daily.     Analgesics:  NSAIDS Passed - 07/09/2019 11:36 AM      Passed - Cr in normal range and within 360 days    Creat  Date Value Ref Range Status  05/28/2017 0.99 0.70 - 1.25 mg/dL Final    Comment:      For patients > or = 63 years of age: The upper reference limit for Creatinine is approximately 13% higher for people identified as African-American.      Creatinine, Ser  Date Value Ref Range Status  06/21/2019 1.07 0.76 - 1.27 mg/dL Final         Passed - HGB in normal range and within 360 days    Hemoglobin  Date Value Ref Range Status  06/21/2019 13.7 13.0 - 17.7 g/dL Final         Passed - Patient is not pregnant      Passed - Valid  encounter within last 12 months    Recent Outpatient Visits          3 months ago Degenerative lumbar spinal stenosis   [place], [place], DO   1 year ago Back pain, unspecified back location, unspecified back pain laterality, unspecified chronicity   [place], Olevia Bowens, DO   2 years ago Lumbar radiculopathy   [place], [place], DO   2 years ago Right hip pain   [place], [place], DO   2 years ago Nonallopathic lesion of sacral region   [place], [place], DO

## 2019-07-09 NOTE — Telephone Encounter (Signed)
Routing to sports med °

## 2019-07-09 NOTE — Telephone Encounter (Signed)
Medication Refill - Medication: gabapentin (NEURONTIN) 300 MG capsule/diclofenac (VOLTAREN) 75 MG EC tablet   Has the patient contacted their pharmacy? Yes.   (Agent: If no, request that the patient contact the pharmacy for the refill.) (Agent: If yes, when and what did the pharmacy advise?)  Preferred Pharmacy (with phone number or street name):  [facility], [place] [phone] (Phone) [phone] (Fax)     Agent: Please be advised that RX refills may take up to 3 business days. We ask that you follow-up with your pharmacy.

## 2019-07-13 MED ORDER — DICLOFENAC SODIUM 75 MG PO TBEC: 75.0000 mg | DELAYED_RELEASE_TABLET | Freq: Two times a day (BID) | ORAL | 1 refills | Status: DC

## 2019-07-13 MED ORDER — GABAPENTIN 300 MG PO CAPS: ORAL_CAPSULE | ORAL | 3 refills | Status: DC

## 2019-08-23 ENCOUNTER — Other Ambulatory Visit: Payer: Self-pay

## 2019-08-23 ENCOUNTER — Other Ambulatory Visit: Payer: 59

## 2019-08-23 DIAGNOSIS — Z13220 Encounter for screening for lipoid disorders: Secondary | ICD-10-CM

## 2019-08-23 LAB — LIPID PANEL
Chol/HDL Ratio: 5.8 ratio — ABNORMAL HIGH (ref 0.0–5.0)
Cholesterol, Total: 226 mg/dL — ABNORMAL HIGH (ref 100–199)
HDL: 39 mg/dL — ABNORMAL LOW (ref >39)
LDL Chol Calc (NIH): 142 mg/dL — ABNORMAL HIGH (ref 0–99)
Triglycerides: 250 mg/dL — ABNORMAL HIGH (ref 0–149)
VLDL Cholesterol Cal: 45 mg/dL — ABNORMAL HIGH (ref 5–40)

## 2019-09-09 ENCOUNTER — Encounter: Payer: Self-pay | Admitting: Internal Medicine

## 2019-09-27 ENCOUNTER — Ambulatory Visit (INDEPENDENT_AMBULATORY_CARE_PROVIDER_SITE_OTHER): Payer: 59 | Admitting: Family Medicine

## 2019-09-27 ENCOUNTER — Encounter: Payer: Self-pay | Admitting: Family Medicine

## 2019-09-27 ENCOUNTER — Other Ambulatory Visit: Payer: Self-pay

## 2019-09-27 VITALS — Temp 97.0°F | Wt 180.0 lb

## 2019-09-27 DIAGNOSIS — K219 Gastro-esophageal reflux disease without esophagitis: Secondary | ICD-10-CM

## 2019-09-27 NOTE — Progress Notes (Signed)
   Subjective:    Patient ID: Gerald [name], male    DOB: May 31, 1956, 63 y.o.   MRN: [id number]  HPI Documentation for virtual telephone encounter. Documentation for virtual audio and video telecommunications through [place] encounter: The patient was located at home. The provider was located in the office. The patient did consent to this visit and is aware of possible charges through their insurance for this visit. The other persons participating in this telemedicine service were none. This virtual service is not related to other E/M service within previous 7 days. He complains of a 10-day history of intermittent dry cough and some slight postnasal drainage.  He did call a telemedicine service and was given a 5-day course of an antibiotic which has had no effect.  He has had no fever, chills, sneezing, itchy watery eyes, earache, smell or taste difference.  He does state that the cough does get worse when he lies down at night.  He does have reflux and does take Nexium 20 mg daily.    Review of Systems     Objective:   Physical Exam Alert and in no distress otherwise not examined       Assessment & Plan:  Gastroesophageal reflux disease, unspecified whether esophagitis present I explained that his symptoms sound more like reflux and I recommend that he double the dosing of the Nexium for the next 2 he was comfortable with that.

## 2019-12-21 ENCOUNTER — Encounter: Payer: Self-pay | Admitting: Internal Medicine

## 2019-12-22 ENCOUNTER — Other Ambulatory Visit: Payer: Self-pay | Admitting: Family Medicine

## 2020-01-15 ENCOUNTER — Ambulatory Visit: Payer: Self-pay | Attending: Internal Medicine

## 2020-01-15 ENCOUNTER — Other Ambulatory Visit: Payer: Self-pay

## 2020-01-15 DIAGNOSIS — Z23 Encounter for immunization: Secondary | ICD-10-CM

## 2020-01-15 NOTE — Progress Notes (Signed)
   Covid-19 Vaccination Clinic  Name:  CAMRIN REAGAN    MRN: [id number] DOB: 07-05-1956  01/15/2020  Mr. Kravchenko was observed post Covid-19 immunization for 15 minutes without incident. He was provided with Vaccine Information Sheet and instruction to access the V-Safe system.   Mr. Winter was instructed to call 911 with any severe reactions post vaccine: Marland Kitchen Difficulty breathing  . Swelling of face and throat  . A fast heartbeat  . A bad rash all over body  . Dizziness and weakness   Immunizations Administered    Name Date Dose VIS Date Route   Pfizer COVID-19 Vaccine 01/15/2020  9:15 AM 0.3 mL 10/15/2019 Intramuscular   Manufacturer: [place]   Lot: [id number]   [place]: [id number]

## 2020-01-18 ENCOUNTER — Ambulatory Visit (AMBULATORY_SURGERY_CENTER): Payer: Self-pay | Admitting: *Deleted

## 2020-01-18 ENCOUNTER — Other Ambulatory Visit: Payer: Self-pay

## 2020-01-18 VITALS — Temp 96.9°F | Ht 72.0 in | Wt 185.0 lb

## 2020-01-18 DIAGNOSIS — Z86010 Personal history of colonic polyps: Secondary | ICD-10-CM

## 2020-01-18 DIAGNOSIS — Z01818 Encounter for other preprocedural examination: Secondary | ICD-10-CM

## 2020-01-18 MED ORDER — SUPREP BOWEL PREP KIT 17.5-3.13-1.6 GM/177ML PO SOLN: 1.0000 | Freq: Once | ORAL | 0 refills | Status: AC

## 2020-01-18 NOTE — Progress Notes (Signed)
No egg or soy allergy known to patient  No issues with past sedation with any surgeries  or procedures, no intubation problems  No diet pills per patient No home 02 use per patient  No blood thinners per patient  Pt denies issues with constipation  No A fib or A flutter  EMMI video sent to pt's e mail   Due to the COVID-19 pandemic we are asking patients to follow these guidelines. Please only bring one care partner. Please be aware that your care partner may wait in the car in the parking lot or if they feel like they will be too hot to wait in the car, they may wait in the lobby on the 4th floor. All care partners are required to wear a mask the entire time (we do not have any that we can provide them), they need to practice social distancing, and we will do a Covid check for all patient's and care partners when you arrive. Also we will check their temperature and your temperature. If the care partner waits in their car they need to stay in the parking lot the entire time and we will call them on their cell phone when the patient is ready for discharge so they can bring the car to the front of the building. Also all patient's will need to wear a mask into building.  suprep code to pharmacy coupon to pt in [place]

## 2020-01-24 ENCOUNTER — Encounter: Payer: Self-pay | Admitting: Family Medicine

## 2020-01-24 ENCOUNTER — Ambulatory Visit (INDEPENDENT_AMBULATORY_CARE_PROVIDER_SITE_OTHER): Payer: 59 | Admitting: Family Medicine

## 2020-01-24 ENCOUNTER — Other Ambulatory Visit: Payer: Self-pay

## 2020-01-24 VITALS — BP 100/68 | HR 85 | Temp 98.2°F | Wt 186.2 lb

## 2020-01-24 DIAGNOSIS — R079 Chest pain, unspecified: Secondary | ICD-10-CM | POA: Diagnosis not present

## 2020-01-24 NOTE — Progress Notes (Signed)
   Subjective:    Patient ID: Gerald [name], male    DOB: [birth year], 64 y.o.   MRN: [id number]  HPI He states that he got the Covid vaccine on March 13 and approximately 4 days after that did have an episode of chest pain, slight dizziness and increased heart rate but did not measure it.  No weakness, diaphoresis or referred pain.  This lasted several hours and he returned to normal after that.  The next day he exercised like he regularly does and states he was on the treadmill for 2-1/2 miles with a heart rate of in the 140 range and had no difficulty with that.  He does not smoke .there is no family history of heart disease but there is a family history of CVA.  Presently he is having no chest pain, shortness of breath, PND   Review of Systems     Objective:   Physical Exam Alert and in no distress.  Cardiac exam shows regular rhythm without murmurs or gallops.  Lungs are clear to auscultation. EKG read by me shows a normal rhythm with no abnormalities.    Assessment & Plan:  Chest pain, unspecified type - Plan: EKG 12-Lead I explained that I did not think the chest pain came from anything cardiac, reassured him on this.  He was comfortable with that.  He will call if he has further difficulties.

## 2020-01-27 ENCOUNTER — Other Ambulatory Visit: Payer: Self-pay | Admitting: Internal Medicine

## 2020-01-27 ENCOUNTER — Ambulatory Visit (INDEPENDENT_AMBULATORY_CARE_PROVIDER_SITE_OTHER): Payer: 59

## 2020-01-27 DIAGNOSIS — Z1159 Encounter for screening for other viral diseases: Secondary | ICD-10-CM

## 2020-01-27 LAB — SARS CORONAVIRUS 2 (TAT 6-24 HRS): SARS Coronavirus 2: NEGATIVE

## 2020-02-01 ENCOUNTER — Other Ambulatory Visit: Payer: Self-pay

## 2020-02-01 ENCOUNTER — Encounter: Payer: Self-pay | Admitting: Internal Medicine

## 2020-02-01 ENCOUNTER — Ambulatory Visit (AMBULATORY_SURGERY_CENTER): Payer: 59 | Admitting: Internal Medicine

## 2020-02-01 VITALS — BP 115/76 | HR 89 | Temp 97.1°F | Resp 18 | Ht 72.0 in | Wt 185.0 lb

## 2020-02-01 DIAGNOSIS — Z86010 Personal history of colonic polyps: Secondary | ICD-10-CM | POA: Diagnosis present

## 2020-02-01 MED ORDER — SODIUM CHLORIDE 0.9 % IV SOLN: 500.0000 mL | Freq: Once | INTRAVENOUS | Status: DC

## 2020-02-01 NOTE — Op Note (Signed)
[facility] Patient Name: Gerald [name] Procedure Date: 02/01/2020 8:41 AM MRN: [id number] Endoscopist: Docia Chuck. Henrene Pastor , MD Age: 64 Referring MD:  Date of Birth: [birth year] Gender: Male Account #: [id number] Procedure:                Colonoscopy Indications:              High risk colon cancer surveillance: Personal                            history of multiple (3 or more) adenomas. Index                            examination 2007 (-); 2017 (4 polyps) Medicines:                Monitored Anesthesia Care Procedure:                Pre-Anesthesia Assessment:                           - Prior to the procedure, a History and Physical                            was performed, and patient medications and                            allergies were reviewed. The patient's tolerance of                            previous anesthesia was also reviewed. The risks                            and benefits of the procedure and the sedation                            options and risks were discussed with the patient.                            All questions were answered, and informed consent                            was obtained. Prior Anticoagulants: The patient has                            taken no previous anticoagulant or antiplatelet                            agents. ASA Grade Assessment: II - A patient with                            mild systemic disease. After reviewing the risks                            and benefits, the patient was deemed in  satisfactory condition to undergo the procedure.                           After obtaining informed consent, the colonoscope                            was passed under direct vision. Throughout the                            procedure, the patient's blood pressure, pulse, and                            oxygen saturations were monitored continuously. The                            Colonoscope was introduced  through the anus and                            advanced to the the cecum, identified by                            appendiceal orifice and ileocecal valve. The                            ileocecal valve, appendiceal orifice, and rectum                            were photographed. The quality of the bowel                            preparation was excellent. The colonoscopy was                            performed without difficulty. The patient tolerated                            the procedure well. The bowel preparation used was                            SUPREP via split dose instruction. Scope In: 8:55:54 AM Scope Out: 9:03:50 AM Scope Withdrawal Time: 0 hours 6 minutes 21 seconds  Total Procedure Duration: 0 hours 7 minutes 56 seconds  Findings:                 Internal hemorrhoids were found during retroflexion.                           The exam was otherwise without abnormality on                            direct and retroflexion views. Complications:            No immediate complications. Estimated blood loss:  None. Estimated Blood Loss:     Estimated blood loss: none. Impression:               - Internal hemorrhoids.                           - The examination was otherwise normal on direct                            and retroflexion views.                           - No specimens collected. Recommendation:           - Repeat colonoscopy in 5 years for surveillance                            (history of multiple adenomas).                           - Patient has a contact number available for                            emergencies. The signs and symptoms of potential                            delayed complications were discussed with the                            patient. Return to normal activities tomorrow.                            Written discharge instructions were provided to the                            patient.                            - Resume previous diet.                           - Continue present medications. Docia Chuck. Henrene Pastor, MD 02/01/2020 9:07:51 AM This report has been signed electronically.

## 2020-02-01 NOTE — Patient Instructions (Signed)
YOU HAD AN ENDOSCOPIC PROCEDURE TODAY AT THE [facility]:   Refer to the procedure report that was given to you for any specific questions about what was found during the examination.  If the procedure report does not answer your questions, please call your gastroenterologist to clarify.  If you requested that your care partner not be given the details of your procedure findings, then the procedure report has been included in a sealed envelope for you to review at your convenience later.  YOU SHOULD EXPECT: Some feelings of bloating in the abdomen. Passage of more gas than usual.  Walking can help get rid of the air that was put into your GI tract during the procedure and reduce the bloating. If you had a lower endoscopy (such as a colonoscopy or flexible sigmoidoscopy) you may notice spotting of blood in your stool or on the toilet paper. If you underwent a bowel prep for your procedure, you may not have a normal bowel movement for a few days.  Please Note:  You might notice some irritation and congestion in your nose or some drainage.  This is from the oxygen used during your procedure.  There is no need for concern and it should clear up in a day or so.  SYMPTOMS TO REPORT IMMEDIATELY:  Following lower endoscopy (colonoscopy or flexible sigmoidoscopy):  Excessive amounts of blood in the stool  Significant tenderness or worsening of abdominal pains  Swelling of the abdomen that is new, acute  Fever of 100F or higher   For urgent or emergent issues, a gastroenterologist can be reached at any hour by calling [phone]. Do not use MyChart messaging for urgent concerns.    DIET:  We do recommend a small meal at first, but then you may proceed to your regular diet.  Drink plenty of fluids but you should avoid alcoholic beverages for 24 hours.  ACTIVITY:  You should plan to take it easy for the rest of today and you should NOT DRIVE or use heavy machinery until tomorrow (because  of the sedation medicines used during the test).    FOLLOW UP: Our staff will call the number listed on your records 48-72 hours following your procedure to check on you and address any questions or concerns that you may have regarding the information given to you following your procedure. If we do not reach you, we will leave a message.  We will attempt to reach you two times.  During this call, we will ask if you have developed any symptoms of COVID 19. If you develop any symptoms (ie: fever, flu-like symptoms, shortness of breath, cough etc.) before then, please call [phone].  If you test positive for Covid 19 in the 2 weeks post procedure, please call and report this information to us.    If any biopsies were taken you will be contacted by phone or by letter within the next 1-3 weeks.  Please call us at [phone] if you have not heard about the biopsies in 3 weeks.    SIGNATURES/CONFIDENTIALITY: You and/or your care partner have signed paperwork which will be entered into your electronic medical record.  These signatures attest to the fact that that the information above on your After Visit Summary has been reviewed and is understood.  Full responsibility of the confidentiality of this discharge information lies with you and/or your care-partner.    Resume medications. Information given on hemorrhoids. 

## 2020-02-01 NOTE — Progress Notes (Signed)
PT taken to PACU. Monitors in place. VSS. Report given to RN. 

## 2020-02-01 NOTE — Progress Notes (Signed)
Pt's states no medical or surgical changes since previsit or office visit.  Gerald [name], VS DT

## 2020-02-03 ENCOUNTER — Telehealth: Payer: Self-pay | Admitting: *Deleted

## 2020-02-03 NOTE — Telephone Encounter (Signed)
  Follow up Call-  Call back number 02/01/2020  Post procedure Call Back phone  # [phone]  Permission to leave phone message Yes  Some recent data might be hidden     Patient questions:  Do you have a fever, pain , or abdominal swelling? No. Pain Score  0 *  Have you tolerated food without any problems? Yes.    Have you been able to return to your normal activities? Yes.    Do you have any questions about your discharge instructions: Diet   No. Medications  No. Follow up visit  No.  Do you have questions or concerns about your Care? No.  Actions: * If pain score is 4 or above: No action needed, pain <4.   1. Have you developed a fever since your procedure? no  2.   Have you had an respiratory symptoms (SOB or cough) since your procedure? no  3.   Have you tested positive for COVID 19 since your procedure no  4.   Have you had any family members/close contacts diagnosed with the COVID 19 since your procedure?  no   If yes to any of these questions please route to Joylene John, RN and Erenest Rasher, RN

## 2020-02-03 NOTE — Telephone Encounter (Signed)
Unable to leave message,mailbox full. 

## 2020-02-07 ENCOUNTER — Ambulatory Visit: Payer: 59 | Attending: Internal Medicine

## 2020-02-07 DIAGNOSIS — Z23 Encounter for immunization: Secondary | ICD-10-CM

## 2020-02-07 NOTE — Progress Notes (Signed)
   Covid-19 Vaccination Clinic  Name:  Gerald [name]    MRN: [id number] DOB: May 05, 1956  02/07/2020  [name] was observed post Covid-19 immunization for 15 minutes without incident. He was provided with Vaccine Information Sheet and instruction to access the V-Safe system.   [name] was instructed to call 911 with any severe reactions post vaccine: Marland Kitchen Difficulty breathing  . Swelling of face and throat  . A fast heartbeat  . A bad rash all over body  . Dizziness and weakness   Immunizations Administered    Name Date Dose VIS Date Route   Pfizer COVID-19 Vaccine 02/07/2020 11:36 AM 0.3 mL 10/15/2019 Intramuscular   Manufacturer: [place]   Lot: [id number]   [place]: [id number]

## 2020-02-08 ENCOUNTER — Ambulatory Visit: Payer: Self-pay

## 2020-03-12 ENCOUNTER — Other Ambulatory Visit: Payer: Self-pay

## 2020-03-12 ENCOUNTER — Emergency Department (HOSPITAL_COMMUNITY)
Admission: EM | Admit: 2020-03-12 | Discharge: 2020-03-13 | Disposition: A | Discharge status: Home / Self Care | Payer: 59 | Attending: Emergency Medicine | Admitting: Emergency Medicine

## 2020-03-12 ENCOUNTER — Encounter (HOSPITAL_COMMUNITY): Payer: Self-pay | Admitting: Emergency Medicine

## 2020-03-12 DIAGNOSIS — Z79899 Other long term (current) drug therapy: Secondary | ICD-10-CM | POA: Insufficient documentation

## 2020-03-12 DIAGNOSIS — Z87891 Personal history of nicotine dependence: Secondary | ICD-10-CM | POA: Insufficient documentation

## 2020-03-12 DIAGNOSIS — R55 Syncope and collapse: Secondary | ICD-10-CM | POA: Insufficient documentation

## 2020-03-12 LAB — CBC
HCT: 37.9 % — ABNORMAL LOW (ref 39.0–52.0)
Hemoglobin: 13.3 g/dL (ref 13.0–17.0)
MCH: 34.4 pg — ABNORMAL HIGH (ref 26.0–34.0)
MCHC: 35.1 g/dL (ref 30.0–36.0)
MCV: 97.9 fL (ref 80.0–100.0)
Platelets: 361 10*3/uL (ref 150–400)
RBC: 3.87 MIL/uL — ABNORMAL LOW (ref 4.22–5.81)
RDW: 13.9 % (ref 11.5–15.5)
WBC: 9.8 10*3/uL (ref 4.0–10.5)
nRBC: 0 % (ref 0.0–0.2)

## 2020-03-12 LAB — BASIC METABOLIC PANEL
Anion gap: 11 (ref 5–15)
BUN: 17 mg/dL (ref 8–23)
CO2: 25 mmol/L (ref 22–32)
Calcium: 9.1 mg/dL (ref 8.9–10.3)
Chloride: 103 mmol/L (ref 98–111)
Creatinine, Ser: 1.08 mg/dL (ref 0.61–1.24)
GFR calc Af Amer: >60 mL/min (ref >60)
GFR calc non Af Amer: >60 mL/min (ref >60)
Glucose, Bld: 140 mg/dL — ABNORMAL HIGH (ref 70–99)
Potassium: 3.7 mmol/L (ref 3.5–5.1)
Sodium: 139 mmol/L (ref 135–145)

## 2020-03-12 MED ORDER — SODIUM CHLORIDE 0.9% FLUSH: 3.0000 mL | Freq: Once | INTRAVENOUS | Status: DC

## 2020-03-12 NOTE — ED Triage Notes (Addendum)
Pt brought to Ed by [facility] from home for c/o syncope, per pt he was just waking when he had a bad cramp on his right leg and hand, started getting very diaphoretic with nausea and vomiting. BP 118/60, HR 80, SPO2 96% RA, CBG 108, SR with frequent PAC on the monitor. 4 mg Zofran given by [facility] pta to ED.

## 2020-03-13 LAB — MAGNESIUM: Magnesium: 2 mg/dL (ref 1.7–2.4)

## 2020-03-13 LAB — TROPONIN I (HIGH SENSITIVITY): Troponin I (High Sensitivity): 8 ng/L (ref <18)

## 2020-03-13 NOTE — ED Notes (Signed)
The pt had a cramp in his rt calf while eating earlier tonight.  He fainted he has no pain at present

## 2020-03-13 NOTE — ED Provider Notes (Signed)
[facility] EMERGENCY DEPARTMENT Provider Note   CSN: [id number] Arrival date & time: 03/12/20  2153     History Chief Complaint  Patient presents with  . Loss of Consciousness    Gerald [name] is a 64 y.o. male.  Patient to ED for evaluation of single syncopal event that happened around 7:30 tonight while at dinner. He reports he played gold this afternoon without symptoms. He hydrated with a bottle of water and 2 gatorades. At dinner, he started having right hamstring cramping/pain, which he has had in the past, and mild right hand cramping. He became nauseous, sweaty and went to the floor and passed out for about 1 minute. His nausea continued after waking. No chest pain, dizziness, headache or abdominal pain at any point. No history of cardiac disease, stroke, previous syncope. He states it took about 2 hours to return to baseline and he continues to feel his normal state of health.   The history is provided by the patient. No language interpreter was used.  Loss of Consciousness Associated symptoms: diaphoresis, nausea and vomiting   Associated symptoms: no chest pain, no dizziness, no fever, no headaches and no shortness of breath        Past Medical History:  Diagnosis Date  . Deviated septum 09/13/14   severe  . Duodenal ulcer   . Esophageal stricture   . GERD (gastroesophageal reflux disease)   . Hyperlipemia    no meds   . Leg length discrepancy   . PUD (peptic ulcer disease)   . Smoker     Patient Active Problem List   Diagnosis Date Noted  . History of colonic polyps 06/21/2019  . Neuroforaminal stenosis of lumbosacral spine 06/04/2018  . Glucose intolerance 06/04/2018  . Pars defect with spondylolisthesis 05/16/2017  . Piriformis syndrome, right 05/12/2017  . Degenerative lumbar spinal stenosis 03/12/2017  . Nonallopathic lesion of lumbosacral region 03/12/2017  . Nonallopathic lesion of sacral region 03/12/2017  . Nonallopathic  lesion of thoracic region 03/12/2017  . History of ischemic bowel disease 10/07/2015  . Former smoker 10/12/2012  . GERD 10/30/2010    Past Surgical History:  Procedure Laterality Date  . BIOPSY SHOULDER Bilateral 03/25/2018   superficial basal cell carcinoma  . COLONOSCOPY  2007  . KNEE ARTHROSCOPY    . POLYPECTOMY    . UPPER GASTROINTESTINAL ENDOSCOPY  2015       Family History  Problem Relation Age of Onset  . Colon polyps Mother   . Arthritis Mother   . Stroke Father   . Colon cancer Neg Hx   . Esophageal cancer Neg Hx   . Rectal cancer Neg Hx   . Stomach cancer Neg Hx     Social History   Tobacco Use  . Smoking status: Former Smoker    Packs/day: 0.25    Types: Cigarettes    Quit date: 06/02/2016    Years since quitting: 3.7  . Smokeless tobacco: Never Used  . Tobacco comment: Smokes occ.Marland KitchenCounseling sheet given 01-2012   Substance Use Topics  . Alcohol use: Yes    Alcohol/week: 0.0 standard drinks    Comment: occasional  . Drug use: No    Home Medications Prior to Admission medications   Medication Sig Start Date End Date Taking? Authorizing Provider  diclofenac (VOLTAREN) 75 MG EC tablet TAKE 1 TABLET TWICE A DAY 12/22/19   Lyndal Pulley, DO  esomeprazole (NEXIUM) 40 MG capsule Take 1 capsule (40 mg total) by  mouth daily at 12 noon. 03/25/19   Tysinger, Camelia Eng, PA-C  gabapentin (NEURONTIN) 300 MG capsule TAKE 1 CAPSULE BY MOUTH EVERYDAY AT BEDTIME 01/18/19   Lyndal Pulley, DO  mupirocin ointment (BACTROBAN) 2 % Apply to inside of each side of nose 3 times / day; squeeze nasal tip to help disperse medication after applying 05/26/17   [provider]    Allergies    Dust mite extract  Review of Systems   Review of Systems  Constitutional: Positive for diaphoresis. Negative for chills and fever.  HENT: Negative.   Respiratory: Negative.  Negative for cough and shortness of breath.   Cardiovascular: Positive for syncope. Negative for chest  pain.  Gastrointestinal: Positive for nausea and vomiting.  Musculoskeletal:       See HPI.  Skin: Negative.   Neurological: Positive for syncope. Negative for dizziness and headaches.    Physical Exam Updated Vital Signs BP 117/66   Pulse 85   Temp 98.3 F (36.8 C) (Oral)   Resp 17   Ht 6' (1.829 m)   Wt 83.9 kg   SpO2 96%   BMI 25.09 kg/m   Physical Exam Vitals and nursing note reviewed.  Constitutional:      General: He is not in acute distress.    Appearance: He is well-developed. He is not diaphoretic.  HENT:     Head: Normocephalic.  Neck:     Vascular: No carotid bruit.  Cardiovascular:     Rate and Rhythm: Normal rate and regular rhythm.     Heart sounds: No murmur.  Pulmonary:     Effort: Pulmonary effort is normal.     Breath sounds: Normal breath sounds. No wheezing, rhonchi or rales.  Abdominal:     General: Bowel sounds are normal.     Palpations: Abdomen is soft.     Tenderness: There is no abdominal tenderness. There is no guarding or rebound.  Musculoskeletal:        General: Normal range of motion.     Cervical back: Normal range of motion and neck supple.  Skin:    General: Skin is warm and dry.  Neurological:     Mental Status: He is alert and oriented to person, place, and time.     Comments: CN's 3-12 grossly intact. Speech is clear and focused. No facial asymmetry. No lateralizing weakness.No deficits of coordination. No pronator drift, negative romberg. Ambulatory without imbalance.       ED Results / Procedures / Treatments   Labs (all labs ordered are listed, but only abnormal results are displayed) Labs Reviewed  BASIC METABOLIC PANEL - Abnormal; Notable for the following components:      Result Value   Glucose, Bld 140 (*)    All other components within normal limits  CBC - Abnormal; Notable for the following components:   RBC 3.87 (*)    HCT 37.9 (*)    MCH 34.4 (*)    All other components within normal limits  URINALYSIS,  ROUTINE W REFLEX MICROSCOPIC  CBG MONITORING, ED    EKG EKG Interpretation  Date/Time:  Sunday Mar 12 2020 22:02:05 EDT Ventricular Rate:  78 PR Interval:  134 QRS Duration: 118 QT Interval:  402 QTC Calculation: 458 R Axis:   37 Text Interpretation: Normal sinus rhythm Incomplete right bundle branch block Borderline ECG No previous ECGs available Confirmed by Ezequiel Essex [phone]) on 03/13/2020 12:15:22 AM   Radiology No results found.  Procedures Procedures (including critical  care time)  Medications Ordered in ED Medications  sodium chloride flush (NS) 0.9 % injection 3 mL (has no administration in time range)    ED Course  I have reviewed the triage vital signs and the nursing notes.  Pertinent labs & imaging results that were available during my care of the patient were reviewed by me and considered in my medical decision making (see chart for details).    MDM Rules/Calculators/A&P                      Patient to ED after single syncopal event tonight as detailed in the HPI.  He is overall well appearing. VSS. Asymptomatic currently. DDx: PE vs vasovagal vs cardiogenic.   No chest pain or SOB. No history of CAD/heart disease. EKG without ischemia. There is an incomplete right BBB, no comparable EKG. Troponin negative. No SOB, pleuritic chest pain. Doubt PE, especially with no bump in troponin. Possible dehydration after playing golf during a hot day, which may involve muscular cramping as described.   He is felt stable for discharge home. He is encouraged to follow up with PCP this week for recheck and any other outpatient testing felt indicated.   Final Clinical Impression(s) / ED Diagnoses Final diagnoses:  None   1. Syncope  Rx / DC Orders ED Discharge Orders    None       Charlann Lange, PA-C 03/13/20 0306    Ward, Delice Bison, DO 03/13/20 [phone]

## 2020-03-13 NOTE — Discharge Instructions (Addendum)
Your labs and EKG are normal and do not identify any concerning abnormality. The reason for your syncopal event last night is unknown. You are felt stable for discharge home. Please follow up with Dr. Redmond School for recheck this coming week. If you have any further symptoms or new concerns, please return to the emergency department.

## 2020-03-13 NOTE — ED Notes (Signed)
Blood drawn off the existing iv and sent to the lab

## 2020-03-28 ENCOUNTER — Other Ambulatory Visit: Payer: Self-pay | Admitting: Medical

## 2020-05-09 ENCOUNTER — Ambulatory Visit (INDEPENDENT_AMBULATORY_CARE_PROVIDER_SITE_OTHER): Payer: 59 | Admitting: Family Medicine

## 2020-05-09 ENCOUNTER — Other Ambulatory Visit: Payer: Self-pay

## 2020-05-09 ENCOUNTER — Encounter: Payer: Self-pay | Admitting: Family Medicine

## 2020-05-09 VITALS — BP 106/60 | HR 80 | Ht 72.0 in | Wt 184.0 lb

## 2020-05-09 DIAGNOSIS — M48061 Spinal stenosis, lumbar region without neurogenic claudication: Secondary | ICD-10-CM

## 2020-05-09 DIAGNOSIS — M545 Low back pain, unspecified: Secondary | ICD-10-CM

## 2020-05-09 DIAGNOSIS — M4807 Spinal stenosis, lumbosacral region: Secondary | ICD-10-CM

## 2020-05-09 DIAGNOSIS — M9973 Connective tissue and disc stenosis of intervertebral foramina of lumbar region: Secondary | ICD-10-CM

## 2020-05-09 MED ORDER — MELOXICAM 15 MG PO TABS
15.0000 mg | ORAL_TABLET | Freq: Every day | ORAL | 0 refills | Status: DC
Start: 2020-05-09 — End: 2020-06-05

## 2020-05-09 MED ORDER — GABAPENTIN 100 MG PO CAPS
200.0000 mg | ORAL_CAPSULE | Freq: Two times a day (BID) | ORAL | 3 refills | Status: DC
Start: 2020-05-09 — End: 2020-07-19

## 2020-05-09 MED ORDER — PREDNISONE 50 MG PO TABS
ORAL_TABLET | ORAL | 0 refills | Status: DC
Start: 2020-05-09 — End: 2020-07-19

## 2020-05-09 NOTE — Progress Notes (Signed)
[facility] [address] [place] Phone: [phone] Subjective:   I Gerald [name] am serving as a Education administrator for Dr. Hulan Saas.  This visit occurred during the SARS-CoV-2 public health emergency.  Safety protocols were in place, including screening questions prior to the visit, additional usage of staff PPE, and extensive cleaning of exam room while observing appropriate contact time as indicated for disinfecting solutions.   I'm seeing this patient by the request  of:  Denita Lung, MD  CC: Back pain follow-up  HCW:[id number]   03/19/2019 Doing well with conservative therapy.  Refilled gabapentin for another year supply.  Diclofenac also given.  Patient will try this and may need to consider changing to a different medications.  History of duodenal ulcers.  Warned of potential any stomach as well as kidney issues.  Patient will monitor.  Patient will follow-up with me if any worsening symptoms or at least on an annual basis.  Update 05/09/2020 Gerald [name] is a 64 y.o. male coming in with complaint of back pain. Patient states he is in a lot of pain. Worse the past month. Right sided lower back/ hip pain. States left side is painful now as well. Bilateral numbness and tingling. Worse on the right. Ice helps a little. Patient states he plays golf. Going to [place] soon to play golf in 44 days. Patient has hamstring cramps. States they make him nauseous.       last MRI 2018 Impression 1. Severe bilateral L5-S1 neural foraminal stenosis. 2. Grade 1 L5-S1 anterolisthesis secondary to bilateral pars interarticularis defects. 3. Small left L4-5 foraminal disc protrusion with endplate spurring mildly narrowing the left neural foramen and in close proximity to the exiting L4 nerve root.  Past Medical History:  Diagnosis Date  . Deviated septum 09/13/14   severe  . Duodenal ulcer   . Esophageal stricture   . GERD (gastroesophageal  reflux disease)   . Hyperlipemia    no meds   . Leg length discrepancy   . PUD (peptic ulcer disease)   . Smoker    Past Surgical History:  Procedure Laterality Date  . BIOPSY SHOULDER Bilateral 03/25/2018   superficial basal cell carcinoma  . COLONOSCOPY  2007  . KNEE ARTHROSCOPY    . POLYPECTOMY    . UPPER GASTROINTESTINAL ENDOSCOPY  2015   Social History   Socioeconomic History  . Marital status: Married    Spouse name: Not on file  . Number of children: Not on file  . Years of education: Not on file  . Highest education level: Not on file  Occupational History  . Occupation: Chartered certified accountant  Tobacco Use  . Smoking status: Former Smoker    Packs/day: 0.25    Types: Cigarettes    Quit date: 06/02/2016    Years since quitting: 3.9  . Smokeless tobacco: Never Used  . Tobacco comment: Smokes occ.Marland KitchenCounseling sheet given 01-2012   Vaping Use  . Vaping Use: Never used  Substance and Sexual Activity  . Alcohol use: Yes    Alcohol/week: 0.0 standard drinks    Comment: occasional  . Drug use: No  . Sexual activity: Yes  Other Topics Concern  . Not on file  Social History Narrative   Daily caffeine   Social Determinants of Health   Financial Resource Strain:   . Difficulty of Paying Living Expenses:   Food Insecurity:   . Worried About Charity fundraiser in the Last  Year:   . Ran Out of Food in the Last Year:   Transportation Needs:   . Film/video editor (Medical):   Marland Kitchen Lack of Transportation (Non-Medical):   Physical Activity:   . Days of Exercise per Week:   . Minutes of Exercise per Session:   Stress:   . Feeling of Stress :   Social Connections:   . Frequency of Communication with Friends and Family:   . Frequency of Social Gatherings with Friends and Family:   . Attends Religious Services:   . Active Member of Clubs or Organizations:   . Attends Archivist Meetings:   Marland Kitchen Marital Status:    Allergies  Allergen Reactions  . Dust Mite  Extract    Family History  Problem Relation Age of Onset  . Colon polyps Mother   . Arthritis Mother   . Stroke Father   . Colon cancer Neg Hx   . Esophageal cancer Neg Hx   . Rectal cancer Neg Hx   . Stomach cancer Neg Hx     Current Outpatient Medications (Endocrine & Metabolic):  .  predniSONE (DELTASONE) 50 MG tablet, 1 tablet by mouth daily       Current Outpatient Medications (Analgesics):  .  diclofenac (VOLTAREN) 75 MG EC tablet, TAKE 1 TABLET TWICE A DAY .  meloxicam (MOBIC) 15 MG tablet, Take 1 tablet (15 mg total) by mouth daily.     Current Outpatient Medications (Other):  .  esomeprazole (NEXIUM) 40 MG capsule, TAKE ONE CAPSULE BY MOUTH DAILY AT 12 NOON .  gabapentin (NEURONTIN) 300 MG capsule, TAKE 1 CAPSULE BY MOUTH EVERYDAY AT BEDTIME .  gabapentin (NEURONTIN) 100 MG capsule, Take 2 capsules (200 mg total) by mouth 2 (two) times daily.  Current Facility-Administered Medications (Other):  .  0.9 %  sodium chloride infusion   Reviewed prior external information including notes and imaging from  primary care provider As well as notes that were available from care everywhere and other healthcare systems.  Past medical history, social, surgical and family history all reviewed in electronic medical record.  No pertanent information unless stated regarding to the chief complaint.   Review of Systems:  No headache, visual changes, nausea, vomiting, diarrhea, constipation, dizziness, abdominal pain, skin rash, fevers, chills, night sweats, weight loss, swollen lymph nodes, body aches, joint swelling, chest pain, shortness of breath, mood changes. POSITIVE muscle aches  Objective  Blood pressure 106/60, pulse 80, height 6' (1.829 m), weight 184 lb (83.5 kg), SpO2 96 %.   General: No apparent distress alert and oriented x3 mood and affect normal, dressed appropriately.  HEENT: Pupils equal, extraocular movements intact  Respiratory: Patient's speak in full  sentences and does not appear short of breath  Cardiovascular: No lower extremity edema, non tender, no erythema  Neuro: Cranial nerves II through XII are intact, neurovascularly intact in all extremities with 2+ DTRs and 2+ pulses.  Gait normal with good balance and coordination.  MSK:  Non tender with full range of motion and good stability and symmetric strength and tone of shoulders, elbows, wrist, hip, knee and ankles bilaterally.  Very mild arthritic changes of multiple joints Patient back exam does show some loss of lordosis.  Tenderness to palpation on the right side of the paraspinal musculature compared to the left.  Mild tightness with FABER test.  Negative straight leg test but does have tightness noted.   Impression and Recommendations:     The above documentation has been  reviewed and is accurate and complete Gerald Pulley, DO       Note: This dictation was prepared with Dragon dictation along with smaller phrase technology. Any transcriptional errors that result from this process are unintentional.

## 2020-05-09 NOTE — Patient Instructions (Addendum)
Good to see you [facility] for epidural [phone] Meloxicam 15 mg Gabapentin 300 at night Gabapentin 100 bid during the day Prednisone hold off unless needed on trip DHEA 50 mg daily for 4 weeks then stop See me again in 2 months

## 2020-05-09 NOTE — Assessment & Plan Note (Signed)
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  This is a chronic problem with exacerbation.  Increasing gabapentin to 300 mg at night in the 100 mg twice daily during the day.  Warned of potential side effects.  Discussed with patient about the prednisone for patient's trip.  I would like to get patient on meloxicam instead of the diclofenac see if this would make any difference at the moment.  Follow-up again in 4 to 8 weeks

## 2020-05-27 ENCOUNTER — Other Ambulatory Visit: Payer: Self-pay | Admitting: Family Medicine

## 2020-05-30 ENCOUNTER — Ambulatory Visit
Admission: RE | Admit: 2020-05-30 | Discharge: 2020-05-30 | Disposition: A | Discharge status: Home / Self Care | Payer: 59 | Source: Ambulatory Visit | Attending: Family Medicine | Admitting: Family Medicine

## 2020-05-30 DIAGNOSIS — M545 Low back pain, unspecified: Secondary | ICD-10-CM

## 2020-05-30 MED ORDER — IOPAMIDOL (ISOVUE-M 200) INJECTION 41%
1.0000 mL | Freq: Once | INTRAMUSCULAR | Status: AC
  Administered 2020-05-30: 1 mL via EPIDURAL

## 2020-05-30 MED ORDER — METHYLPREDNISOLONE ACETATE 40 MG/ML INJ SUSP (RADIOLOG
120.0000 mg | Freq: Once | INTRAMUSCULAR | Status: AC
  Administered 2020-05-30: 120 mg via EPIDURAL

## 2020-05-30 NOTE — Discharge Instructions (Signed)

## 2020-06-03 ENCOUNTER — Other Ambulatory Visit: Payer: Self-pay | Admitting: Family Medicine

## 2020-06-25 ENCOUNTER — Other Ambulatory Visit: Payer: Self-pay | Admitting: Family Medicine

## 2020-06-27 ENCOUNTER — Other Ambulatory Visit: Payer: Self-pay | Admitting: Family Medicine

## 2020-07-06 ENCOUNTER — Other Ambulatory Visit: Payer: Self-pay

## 2020-07-06 ENCOUNTER — Other Ambulatory Visit (INDEPENDENT_AMBULATORY_CARE_PROVIDER_SITE_OTHER): Payer: 59

## 2020-07-06 DIAGNOSIS — Z789 Other specified health status: Secondary | ICD-10-CM

## 2020-07-06 LAB — POC COVID19 BINAXNOW: SARS Coronavirus 2 Ag: NEGATIVE

## 2020-07-07 LAB — NOVEL CORONAVIRUS, NAA: SARS-CoV-2, NAA: NOT DETECTED

## 2020-07-17 ENCOUNTER — Ambulatory Visit (INDEPENDENT_AMBULATORY_CARE_PROVIDER_SITE_OTHER): Payer: 59 | Admitting: Family Medicine

## 2020-07-17 ENCOUNTER — Other Ambulatory Visit: Payer: Self-pay

## 2020-07-17 ENCOUNTER — Encounter: Payer: Self-pay | Admitting: Family Medicine

## 2020-07-17 DIAGNOSIS — K219 Gastro-esophageal reflux disease without esophagitis: Secondary | ICD-10-CM | POA: Diagnosis not present

## 2020-07-17 DIAGNOSIS — M48061 Spinal stenosis, lumbar region without neurogenic claudication: Secondary | ICD-10-CM | POA: Diagnosis not present

## 2020-07-17 DIAGNOSIS — M9973 Connective tissue and disc stenosis of intervertebral foramina of lumbar region: Secondary | ICD-10-CM

## 2020-07-17 DIAGNOSIS — M4807 Spinal stenosis, lumbosacral region: Secondary | ICD-10-CM

## 2020-07-17 DIAGNOSIS — Z23 Encounter for immunization: Secondary | ICD-10-CM

## 2020-07-17 DIAGNOSIS — Z86010 Personal history of colonic polyps: Secondary | ICD-10-CM

## 2020-07-17 DIAGNOSIS — Z0000 Encounter for general adult medical examination without abnormal findings: Secondary | ICD-10-CM | POA: Diagnosis not present

## 2020-07-17 DIAGNOSIS — E7439 Other disorders of intestinal carbohydrate absorption: Secondary | ICD-10-CM

## 2020-07-17 DIAGNOSIS — M720 Palmar fascial fibromatosis [Dupuytren]: Secondary | ICD-10-CM

## 2020-07-17 DIAGNOSIS — N529 Male erectile dysfunction, unspecified: Secondary | ICD-10-CM

## 2020-07-17 LAB — POCT URINALYSIS DIP (PROADVANTAGE DEVICE)
Blood, UA: NEGATIVE
Glucose, UA: NEGATIVE mg/dL
Leukocytes, UA: NEGATIVE
Nitrite, UA: NEGATIVE
Specific Gravity, Urine: 1.03
Urobilinogen, Ur: 0.2
pH, UA: 6 (ref 5.0–8.0)

## 2020-07-17 MED ORDER — TADALAFIL 20 MG PO TABS: 20.0000 mg | ORAL_TABLET | Freq: Every day | ORAL | 0 refills | Status: DC | PRN

## 2020-07-17 NOTE — Progress Notes (Signed)
   Subjective:    Patient ID: Gerald [name], male    DOB: [birth year], 64 y.o.   MRN: [id number]  HPI He is here for complete examination.  He has been having a great deal of difficulty with his back and does have a pars defect and subsequent neural foraminal stenosis.  He is being followed by Dr. Gardenia Phlegm for this.  He has tried various NSAIDs as well as several epidurals.  The most recent one was in July and apparently it has worn off.  He has seen neurosurgery about this and a discussion of surgery was discussed however he is not interested in that if at all possible.  He also has a history of colonic polyps and most recent colonoscopy was negative for polyps.  He started to notice some difficulty with erectile dysfunction and would like medication to help with this.  He does have a history of reflux disease but presently is having very little difficulty with that.  Does have a history of glucose intolerance He also notes changes in the palm of his right hand and notes that his father had Dupuytren's contracture.  He is concerned about this.  Presently he is having no difficulty with that.  Family and social history as well as health maintenance and immunizations was reviewed  Review of Systems  All other systems reviewed and are negative.      Objective:   Physical Exam Alert and in no distress. Tympanic membranes and canals are normal. Pharyngeal area is normal. Neck is supple without adenopathy or thyromegaly. Cardiac exam shows a regular sinus rhythm without murmurs or gallops. Lungs are clear to auscultation.  Exam of the right hand does show early Dupuytren contracture with a knot over the flexor tendons in the palm of the hand.       Assessment & Plan:  Routine general medical examination at a health care facility - Plan: CBC with Differential/Platelet, Comprehensive metabolic panel, Lipid panel  Gastroesophageal reflux disease, unspecified whether esophagitis  present  Degenerative lumbar spinal stenosis - Plan: Ambulatory referral to Neurosurgery  Neuroforaminal stenosis of lumbosacral spine - Plan: Ambulatory referral to Neurosurgery  Glucose intolerance  History of colonic polyps  Need for influenza vaccination - Plan: Flu Vaccine QUAD 36+ mos IM  Erectile dysfunction, unspecified erectile dysfunction type - Plan: tadalafil (CIALIS) 20 MG tablet  Dupuytren's contracture of right hand No therapy for the reflux disease since he is not having any difficulty with that. Discussed the back pain and possible solutions.  I will refer him to neurosurgery for another opinion.  Discussed the use of pain medications including tramadol.  I explained that that is a possibility but would not want him to do that with any regularity except perhaps when he is playing golf.  Recommend he discuss this with Dr. Tamala Julian. I then discussed the Dupuytren's contracture and since he is really not having any functional impairment, any intervention would not be needed. He is scheduled for follow-up colonoscopy in 5 years. Discussed use of Cialis and possible side effects.

## 2020-07-17 NOTE — Addendum Note (Signed)
Addended by: Elyse Jarvis on: 07/17/2020 02:13 PM   Modules accepted: Orders

## 2020-07-18 LAB — LIPID PANEL
Chol/HDL Ratio: 5.5 ratio — ABNORMAL HIGH (ref 0.0–5.0)
Cholesterol, Total: 175 mg/dL (ref 100–199)
HDL: 32 mg/dL — ABNORMAL LOW (ref >39)
LDL Chol Calc (NIH): 95 mg/dL (ref 0–99)
Triglycerides: 284 mg/dL — ABNORMAL HIGH (ref 0–149)
VLDL Cholesterol Cal: 48 mg/dL — ABNORMAL HIGH (ref 5–40)

## 2020-07-18 LAB — COMPREHENSIVE METABOLIC PANEL
ALT: 19 IU/L (ref 0–44)
AST: 16 IU/L (ref 0–40)
Albumin/Globulin Ratio: 1.7 (ref 1.2–2.2)
Albumin: 4.3 g/dL (ref 3.8–4.8)
Alkaline Phosphatase: 77 IU/L (ref 44–121)
BUN/Creatinine Ratio: 18 (ref 10–24)
BUN: 18 mg/dL (ref 8–27)
Bilirubin Total: 0.3 mg/dL (ref 0.0–1.2)
CO2: 23 mmol/L (ref 20–29)
Calcium: 9.3 mg/dL (ref 8.6–10.2)
Chloride: 105 mmol/L (ref 96–106)
Creatinine, Ser: 0.99 mg/dL (ref 0.76–1.27)
GFR calc Af Amer: 93 mL/min/{1.73_m2} (ref >59)
GFR calc non Af Amer: 80 mL/min/{1.73_m2} (ref >59)
Globulin, Total: 2.6 g/dL (ref 1.5–4.5)
Glucose: 102 mg/dL — ABNORMAL HIGH (ref 65–99)
Potassium: 4.5 mmol/L (ref 3.5–5.2)
Sodium: 140 mmol/L (ref 134–144)
Total Protein: 6.9 g/dL (ref 6.0–8.5)

## 2020-07-18 LAB — CBC WITH DIFFERENTIAL/PLATELET
Basophils Absolute: 0.1 10*3/uL (ref 0.0–0.2)
Basos: 1 %
EOS (ABSOLUTE): 0.1 10*3/uL (ref 0.0–0.4)
Eos: 2 %
Hematocrit: 38.7 % (ref 37.5–51.0)
Hemoglobin: 13.1 g/dL (ref 13.0–17.7)
Immature Grans (Abs): 0 10*3/uL (ref 0.0–0.1)
Immature Granulocytes: 0 %
Lymphocytes Absolute: 2.3 10*3/uL (ref 0.7–3.1)
Lymphs: 38 %
MCH: 33 pg (ref 26.6–33.0)
MCHC: 33.9 g/dL (ref 31.5–35.7)
MCV: 98 fL — ABNORMAL HIGH (ref 79–97)
Monocytes Absolute: 0.4 10*3/uL (ref 0.1–0.9)
Monocytes: 6 %
Neutrophils Absolute: 3.1 10*3/uL (ref 1.4–7.0)
Neutrophils: 53 %
Platelets: 314 10*3/uL (ref 150–450)
RBC: 3.97 x10E6/uL — ABNORMAL LOW (ref 4.14–5.80)
RDW: 13.8 % (ref 11.6–15.4)
WBC: 6 10*3/uL (ref 3.4–10.8)

## 2020-07-19 ENCOUNTER — Ambulatory Visit (INDEPENDENT_AMBULATORY_CARE_PROVIDER_SITE_OTHER): Payer: 59 | Admitting: Family Medicine

## 2020-07-19 ENCOUNTER — Encounter: Payer: Self-pay | Admitting: Family Medicine

## 2020-07-19 ENCOUNTER — Other Ambulatory Visit: Payer: Self-pay

## 2020-07-19 VITALS — BP 128/82 | HR 79 | Ht 71.0 in | Wt 185.0 lb

## 2020-07-19 DIAGNOSIS — M4300 Spondylolysis, site unspecified: Secondary | ICD-10-CM | POA: Diagnosis not present

## 2020-07-19 DIAGNOSIS — M9973 Connective tissue and disc stenosis of intervertebral foramina of lumbar region: Secondary | ICD-10-CM

## 2020-07-19 DIAGNOSIS — M4807 Spinal stenosis, lumbosacral region: Secondary | ICD-10-CM

## 2020-07-19 DIAGNOSIS — M4310 Spondylolisthesis, site unspecified: Secondary | ICD-10-CM | POA: Diagnosis not present

## 2020-07-19 MED ORDER — PREGABALIN 75 MG PO CAPS: 75.0000 mg | ORAL_CAPSULE | Freq: Two times a day (BID) | ORAL | 3 refills | Status: DC | PRN

## 2020-07-19 NOTE — Progress Notes (Signed)
Gerald [name], am serving as a scribe for Dr. [name]. This visit occurred during the SARS-CoV-2 public health emergency.  Safety protocols were in place, including screening questions prior to the visit, additional usage of staff PPE, and extensive cleaning of exam room while observing appropriate contact time as indicated for disinfecting solutions.   Gerald [name] is a 64 y.o. male who [facility] today for f/u of chronic LbP.  He was last seen by Dr. Tamala Julian on 05/09/20 w/ c/o worsening LBP and B LE numbness/tingling.  He was referred for an epidural and had a R L5 epidrual on 05/30/20.  He was also prescribed Meloxicam and a short course of prednisone and advised to increase his dosage of prednisone.  Since his last visit w/ Dr. Tamala Julian, pt reports that he had 3 weeks of pain relief. R>L. Pain radiates down right leg into calf. Using 400mg  gabapentin daily. Has not been using Meloxicam. Is not seeing benefit from Voltaren tabs. Did not take prednisone course.   Diagnostic testing: L-spine MRI- 07/22/17; L-spine XR- 04/15/17   Pertinent review of systems: [name] fevers or chills  Relevant historical information: Pars defect L4-L5   Exam:  BP 128/82   Pulse 79   Ht 5\' 11"  (1.803 m)   Wt 185 lb (83.9 kg)   SpO2 95%   BMI 25.80 kg/m  General: Well Developed, well nourished, and in [name] acute distress.   MSK: L-spine normal-appearing Nontender midline. Normal lumbar motion some pain with extension. Lower extremity strength reflexes and sensation are intact distally.  Positive right-sided slump test. Right hip normal-appearing mildly tender palpation at piriformis.  Normal hip motion and strength.    Lab and Radiology Results EXAM: MRI LUMBAR SPINE WITHOUT CONTRAST  TECHNIQUE: Multiplanar, multisequence MR imaging of the lumbar spine was performed. [name] intravenous contrast was administered.  COMPARISON:   None.  FINDINGS: Segmentation:  Normal  Alignment: Grade 1 anterolisthesis at L5-S1 secondary to bilateral pars interarticularis defects.  Vertebrae: [name] acute compression fracture, discitis-osteomyelitis, facet edema or other focal marrow lesion. [name] epidural collection. A arm  Conus medullaris: Extends to the L1 level and appears normal.  Paraspinal and other soft tissues: Negative.  Disc levels:  T12-L1: Normal disc space and facets. [name] spinal canal or neuroforaminal stenosis.  L1-L2: Normal disc space and facets. [name] spinal canal or neuroforaminal stenosis.  L2-L3: Normal disc space and facets. [name] spinal canal or neuroforaminal stenosis.  L3-L4: Normal disc space and facets. [name] spinal canal or neuroforaminal stenosis.  L4-L5: Small left foraminal disc protrusion with endplate spurring results in mild neural foraminal stenosis. The disc is in close proximity to the exiting L4 nerve root in the extraforaminal zone. [name] spinal canal stenosis.  L5-S1: Grade 1 anterolisthesis due to bilateral pars interarticularis defects. Small disc bulge. [name] spinal canal stenosis. Severe bilateral neural foraminal stenosis.  Visualized sacrum: Normal.  IMPRESSION: 1. Severe bilateral L5-S1 neural foraminal stenosis. 2. Grade 1 L5-S1 anterolisthesis secondary to bilateral pars interarticularis defects. 3. Small left L4-5 foraminal disc protrusion with endplate spurring mildly narrowing the left neural foramen and in close proximity to the exiting L4 nerve root.   Electronically Signed   By: Ulyses Jarred M.D.   On: 07/23/2017 01:57  I, [name], personally (independently) visualized and performed the interpretation of the images attached in this note. Agree with radiology read    Assessment and Plan: 64 y.o. 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  Patient already had one epidural steroid injection in July with  moderate benefit.  He is not really had physical therapy yet which is an obvious thing to do next.  Plan for physical therapy.  Will update MRI given last MRI was 2018 and he did not improve significantly with recent epidural steroid injection.  We will also switch from gabapentin to Lyrica as this may be a little bit more more effective.  Recheck after MRI.   Is Orders Placed This Encounter  Procedures  . MR Lumbar Spine Wo Contrast    Standing Status:   Future    Standing Expiration Date:   07/19/2021    Order Specific Question:   What is the patient's sedation requirement?    Answer:   [name] Sedation    Order Specific Question:   Does the patient have a pacemaker or implanted devices?    Answer:   [name]    Order Specific Question:   Preferred imaging location?    Answer:   Product/process development scientist (table limit-350lbs)    Order Specific Question:   Radiology Contrast Protocol - do NOT remove file path    Answer:   [email or web address]  . Ambulatory referral to Physical Therapy    Referral Priority:   Routine    Referral Type:   Physical Medicine    Referral Reason:   Specialty Services Required    Requested Specialty:   Physical Therapy    Number of Visits Requested:   1   Meds ordered this encounter  Medications  . DISCONTD: pregabalin (LYRICA) 75 MG capsule    Sig: Take 1 capsule (75 mg total) by mouth 2 (two) times daily as needed.    Dispense:  60 capsule    Refill:  3  . pregabalin (LYRICA) 75 MG capsule    Sig: Take 1 capsule (75 mg total) by mouth 2 (two) times daily as needed.    Dispense:  60 capsule    Refill:  3     Discussed warning signs or symptoms. Please see discharge instructions. Patient expresses understanding.   The above documentation has been reviewed and is accurate and complete [name], M.D.

## 2020-07-19 NOTE — Patient Instructions (Signed)
Thank you for coming in today. Plan for MRI and physical therapy. Try switching to lyrica.  After MRI could consider repeat injection.  Recommend you recheck after MRI.

## 2020-07-20 ENCOUNTER — Telehealth: Payer: Self-pay | Admitting: Family Medicine

## 2020-07-20 ENCOUNTER — Telehealth: Payer: Self-pay

## 2020-07-20 NOTE — Telephone Encounter (Signed)
Yes Lyrica replaces gabapentin.

## 2020-07-20 NOTE — Telephone Encounter (Signed)
Caren Griffins called to advise that Dr. Cyndy Freeze is no longer at the practice. LVM to find out if pt would like to continue with the referral and see Dr. Vertell Limber or if he had another doctor in mind. Caren Griffins can be reached at [phone]. [place]

## 2020-07-20 NOTE — Telephone Encounter (Signed)
Pt informed to start Lyrica and discontinue Gabapentin, pt understood.  He had several other med questions, as he has some meds from [place] as well. Should he also continue the Voltaren, the meloxicam?

## 2020-07-20 NOTE — Telephone Encounter (Signed)
Pt seen 9/15. He is unclear, if he starts the Lyrica does he discontinue the Gabapentin?

## 2020-07-21 NOTE — Telephone Encounter (Signed)
Take either the oral diclofenac (Voltaren) or meloxicam but not both.  They both are doing the same basic kind of thing.  Topical Voltaren gel is okay to use with oral meloxicam oral Voltaren.

## 2020-07-21 NOTE — Telephone Encounter (Signed)
Pt advised that he did not want to see Dr. Vertell Limber. Please advise of another neurosugeon. Thanks [employer]

## 2020-07-21 NOTE — Telephone Encounter (Signed)
He wants to get a second opinion from somebody other than Gerald [name] out of that office.  Go ahead and arrange it

## 2020-07-21 NOTE — Telephone Encounter (Signed)
Returned pt's call and informed him of Dr. Clovis Riley response regarding Meloxicam and Voltaren.  Pt verbalizes understanding.

## 2020-07-24 NOTE — Telephone Encounter (Signed)
LVM advising that pt would like to see another doctor in the practice. Not sure if this will be possible but will await a call back to confirm either way. [place]

## 2020-07-25 ENCOUNTER — Other Ambulatory Visit: Payer: Self-pay | Admitting: Family Medicine

## 2020-07-25 ENCOUNTER — Other Ambulatory Visit: Payer: Self-pay

## 2020-07-25 ENCOUNTER — Ambulatory Visit (INDEPENDENT_AMBULATORY_CARE_PROVIDER_SITE_OTHER): Payer: 59

## 2020-07-25 DIAGNOSIS — M4807 Spinal stenosis, lumbosacral region: Secondary | ICD-10-CM

## 2020-07-25 DIAGNOSIS — M4300 Spondylolysis, site unspecified: Secondary | ICD-10-CM | POA: Diagnosis not present

## 2020-07-25 DIAGNOSIS — M4310 Spondylolisthesis, site unspecified: Secondary | ICD-10-CM | POA: Diagnosis not present

## 2020-07-25 DIAGNOSIS — M9973 Connective tissue and disc stenosis of intervertebral foramina of lumbar region: Secondary | ICD-10-CM | POA: Diagnosis not present

## 2020-07-25 DIAGNOSIS — N529 Male erectile dysfunction, unspecified: Secondary | ICD-10-CM

## 2020-07-25 NOTE — Progress Notes (Signed)
MRI right lumbar spine shows pars defects and spondylolisthesis at L5-S1 that has progressed a bit and worsened a bit since 2018.  There is some bulging disks otherwise in the lumbar spine which are contributing to some of the pain.  Either recheck with me in clinic sooner or follow-up as scheduled with Dr. Tamala Julian on October 7 to review these results in further detail.  Would recommend physical therapy and repeat epidural steroid injection if needed.

## 2020-07-25 NOTE — Telephone Encounter (Signed)
[facility] is requesting to fill pt tadalafil. Please advise Kh 

## 2020-07-26 ENCOUNTER — Encounter: Payer: Self-pay | Admitting: Physical Therapy

## 2020-07-26 ENCOUNTER — Telehealth: Payer: Self-pay | Admitting: Family Medicine

## 2020-07-26 NOTE — Telephone Encounter (Signed)
Pt's choice per Dr. Clovis Riley result note:  "MRI right lumbar spine shows pars defects and spondylolisthesis at L5-S1 that has progressed a bit and worsened a bit since 2018.  There is some bulging disks otherwise in the lumbar spine which are contributing to some of the pain.  Either recheck with me in clinic sooner or follow-up as scheduled with Dr. Tamala Julian on October 7 to review these results in further detail.  Would recommend physical therapy and repeat epidural steroid injection if needed."

## 2020-07-26 NOTE — Telephone Encounter (Signed)
MyChart message sent to pt w/ this information.

## 2020-07-26 NOTE — Telephone Encounter (Signed)
Pt viewed MRI results from Dr. Georgina Snell. He has an appt scheduled with Tamala Julian 10/7 but wonders if he needs to come in to further discuss the results or if he should wait for his October appt.

## 2020-07-31 ENCOUNTER — Encounter: Payer: Self-pay | Admitting: Family Medicine

## 2020-07-31 ENCOUNTER — Ambulatory Visit (INDEPENDENT_AMBULATORY_CARE_PROVIDER_SITE_OTHER): Payer: 59 | Admitting: Family Medicine

## 2020-07-31 ENCOUNTER — Other Ambulatory Visit: Payer: Self-pay

## 2020-07-31 VITALS — BP 110/70 | HR 83 | Ht 71.0 in | Wt 186.0 lb

## 2020-07-31 DIAGNOSIS — M4300 Spondylolysis, site unspecified: Secondary | ICD-10-CM

## 2020-07-31 DIAGNOSIS — M4310 Spondylolisthesis, site unspecified: Secondary | ICD-10-CM

## 2020-07-31 DIAGNOSIS — M9973 Connective tissue and disc stenosis of intervertebral foramina of lumbar region: Secondary | ICD-10-CM

## 2020-07-31 DIAGNOSIS — M4807 Spinal stenosis, lumbosacral region: Secondary | ICD-10-CM

## 2020-07-31 MED ORDER — HYDROCODONE-ACETAMINOPHEN 5-325 MG PO TABS: 1.0000 | ORAL_TABLET | Freq: Four times a day (QID) | ORAL | 0 refills | Status: DC | PRN

## 2020-07-31 MED ORDER — PREGABALIN 150 MG PO CAPS: 150.0000 mg | ORAL_CAPSULE | Freq: Two times a day (BID) | ORAL | 3 refills | Status: DC | PRN

## 2020-07-31 NOTE — Progress Notes (Signed)
Gerald [name] is a 64 y.o. male who presents to [place] at [facility] today for f/u of chronic LBP and B LE pain and numbness/tingling and L-spine MRI review.  He was last seen by Dr. Georgina Snell on 07/19/20 w/ con't LBP and radiating pain into his R LE to the calf.  He was switched to Lyrica and referred for an L-spine MRI and to PT.  He has had 4 prior lumbar epidurals w/ the most recent being a R LE epidural on 05/30/20.  Since his last visit, pt reports that he would like to get another epidural. Is interested in discussing how to make epidural last longer. States that his pain is worse now than it has been. Is going to see neurosurgeon on Wednesday for a second opinion.  Diagnostic testing: L-spine MRI- 07/25/20, 07/22/17; L-spine XR- 04/15/17  2018 MRI lumbar spine IMPRESSION: 1. Severe bilateral L5-S1 neural foraminal stenosis. 2. Grade 1 L5-S1 anterolisthesis secondary to bilateral pars interarticularis defects. 3. Small left L4-5 foraminal disc protrusion with endplate spurring mildly narrowing the left neural foramen and in close proximity to the exiting L4 nerve root.  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   Exam:  BP 110/70   Pulse 83   Ht 5\' 11"  (1.803 m)   Wt 186 lb (84.4 kg)   SpO2 96%   BMI 25.94 kg/m  General: Well Developed, well nourished, and in no acute distress.   MSK: Normal gait.    Lab and Radiology Results MR Lumbar Spine Wo Contrast  Result Date: 07/25/2020 CLINICAL DATA:  Low back pain extending into the right lower extremity. EXAM: MRI LUMBAR SPINE WITHOUT CONTRAST TECHNIQUE: Multiplanar, multisequence MR imaging of the lumbar spine was performed. No intravenous contrast was administered. COMPARISON:  MRI of lumbar spine 07/19/2017 FINDINGS: Segmentation: 5 non rib-bearing lumbar type vertebral bodies are present. The lowest fully formed vertebral body is L5.  Alignment: Grade 1 anterolisthesis at L5-S1 has increased, now measuring 5 mm. No other significant listhesis is present. Some straightening of the normal lumbar lordosis is stable. Dextroconvex curvature of the lumbar spine is centered at L2-3. Vertebrae: Progressive edematous endplate marrow changes are present at L5-S1. Chronic fatty endplate marrow changes present along the superior endplate of L2 and L3. Conus medullaris and cauda equina: Conus extends to the L1 level. Conus and cauda equina appear normal. Paraspinal and other soft tissues: Limited imaging the abdomen is unremarkable. There is no significant adenopathy. No solid organ lesions are present. Disc levels: T12-L1: Negative. L1-2: Negative. L2-3: Slight disc bulging is present. No significant stenosis is present. L3-4: Mild disc bulging is similar the prior exam. No significant stenosis is present. L4-5: A far left lateral disc protrusion demonstrates some progression. The central canal is patent. Moderate left foraminal narrowing has progressed. The right foramen is patent. L5-S1: Further anterolisthesis is associated bilateral pars defects. Central canal is patent. Moderate foraminal narrowing bilaterally demonstrates slight progression. IMPRESSION: 1. Further anterolisthesis and bilateral pars defects at L5-S1 with slight progression of moderate foraminal narrowing bilaterally. 2. Progressive far left lateral disc protrusion at L4-5 with moderate left foraminal narrowing. 3. Mild disc bulging at L2-3 and L3-4 without significant stenosis at these levels. Electronically Signed   By: San Morelle M.D.   On: 07/25/2020 11:20   DG Epidural/Nerve Root  Result Date: 05/30/2020 CLINICAL DATA:  Spondylolisthesis at L5-S1. Right buttock pain radiating to the right  calf. EXAM: Right L5 nerve root block and trans foraminal epidural. COMPARISON:  07/22/2017 MRI.  Previous injection images. TECHNIQUE: The overlying skin was prepped with Betadine  and draped in sterile fashion. Skin anesthesia was carried out using 1% Lidocaine. A curved 22 gauge spinal needle was directed into the superior ventral neural foramen on the right at L5-S1.Injection of a few drops of Isovue 200 demonstrates spread outlining the right L5 nerve root. No intravascular extension. 120mg  Depo-Medrol and 2cc 1% Lidocaine were subsequently administered. No apparent complication. The patient tolerated the procedure without difficulty and was transferred to recovery in excellent condition. FLUOROSCOPY TIME:  1 minutes 8 seconds. 58.48 micro gray meter squared IMPRESSION: Technically successful right L5 selective nerve root block and transforaminal epidural steroid injection. Electronically Signed   By: Nelson Chimes M.D.   On: 05/30/2020 09:55   I, Gerald [name], personally (independently) visualized and performed the interpretation of the images attached in this note. MRI images show mild anterolisthesis L5-S1 with bulging disc with foraminal narrowing L5-S1 slightly worse than images obtained MRI L-spine 2018.    Assessment and Plan: 64 y.o. male with lumbar radiculopathy right leg with hip pain.  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  Patient is currently receiving physical therapy for his back and for piriformis syndrome.  He had a transforaminal epidural steroid injection in July that lasted only for a short while.  Discussed his MRI results with him at length today. Plan for repeat epidural steroid injection.  Recommend using the interlaminar approach at this time.   Patient already has an appointment scheduled with neurosurgery tomorrow for surgical second opinion.  He would very much like to avoid surgery if possible.  Discussed that if epidural steroid injections cannot provide lasting benefit his best option is to proceed with surgery.   Plan also to increase Lyrica and prescribe a small amount of hydrocodone.  PDMP reviewed during this  encounter. Orders Placed This Encounter  Procedures  . DG INJECT DIAG/THERA/INC NEEDLE/CATH/PLC EPI/LUMB/SAC W/IMG    Standing Status:   Future    Standing Expiration Date:   07/31/2021    Order Specific Question:   Reason for Exam (SYMPTOM  OR DIAGNOSIS REQUIRED)    Answer:   L5 rdiculopathy. Consider interlaminar injection. Transformaninal did not help much. Level and technique per radiology    Order Specific Question:   Preferred Imaging Location?    Answer:   [id number]    Order Specific Question:   Radiology Contrast Protocol - do NOT remove file path    Answer:   [email or web address]   Meds ordered this encounter  Medications  . pregabalin (LYRICA) 150 MG capsule    Sig: Take 1 capsule (150 mg total) by mouth 2 (two) times daily as needed.    Dispense:  60 capsule    Refill:  3  . HYDROcodone-acetaminophen (NORCO/VICODIN) 5-325 MG tablet    Sig: Take 1 tablet by mouth every 6 (six) hours as needed.    Dispense:  15 tablet    Refill:  0     Discussed warning signs or symptoms. Please see discharge instructions. Patient expresses understanding.   The above documentation has been reviewed and is accurate and complete Gerald [name], M.D.  Total encounter time 30 minutes including face-to-face time with the patient and, reviewing past medical record, and charting on the date of service.  Viewed MRI results and images and discussed next step and plans.

## 2020-07-31 NOTE — Patient Instructions (Addendum)
Thank you for coming in today.  Come back or go to the emergency room if you notice new weakness new numbness problems walking or bowel or bladder problems.  Consult with Neurosurgery.   .Please call [facility] at [phone] to schedule your spine injection.    Epidural Steroid Injection Patient Information  Description: The epidural space surrounds the nerves as they exit the spinal cord.  In some patients, the nerves can be compressed and inflamed by a bulging disc or a tight spinal canal (spinal stenosis).  By injecting steroids into the epidural space, we can bring irritated nerves into direct contact with a potentially helpful medication.  These steroids act directly on the irritated nerves and can reduce swelling and inflammation which often leads to decreased pain.  Epidural steroids may be injected anywhere along the spine and from the neck to the low back depending upon the location of your pain.   After numbing the skin with local anesthetic (like Novocaine), a small needle is passed into the epidural space slowly.  You may experience a sensation of pressure while this is being done.  The entire block usually last less than 10 minutes.  Conditions which may be treated by epidural steroids:   Low back and leg pain  Neck and arm pain  Spinal stenosis  Post-laminectomy syndrome  Herpes zoster (shingles) pain  Pain from compression fractures  Preparation for the injection:  1. Do not eat any solid food or dairy products within 8 hours of your appointment.  2. You may drink clear liquids up to 3 hours before appointment.  Clear liquids include water, black coffee, juice or soda.  No milk or cream please. 3. You may take your regular medication, including pain medications, with a sip of water before your appointment  Diabetics should hold regular insulin (if taken separately) and take 1/2 normal NPH dos the morning of the procedure.  Carry some sugar containing items  with you to your appointment. 4. A driver must accompany you and be prepared to drive you home after your procedure.  5. Bring all your current medications with your. 6. An IV may be inserted and sedation may be given at the discretion of the physician.   7. A blood pressure cuff, EKG and other monitors will often be applied during the procedure.  Some patients may need to have extra oxygen administered for a short period. 8. You will be asked to provide medical information, including your allergies, prior to the procedure.  We must know immediately if you are taking blood thinners (like Coumadin/Warfarin)  Or if you are allergic to IV iodine contrast (dye). We must know if you could possible be pregnant.  Possible side-effects:  Bleeding from needle site  Infection (rare, may require surgery)  Nerve injury (rare)  Numbness & tingling (temporary)  Difficulty urinating (rare, temporary)  Spinal headache ( a headache worse with upright posture)  Light -headedness (temporary)  Pain at injection site (several days)  Decreased blood pressure (temporary)  Weakness in arm/leg (temporary)  Pressure sensation in back/neck (temporary)  Call if you experience:  Fever/chills associated with headache or increased back/neck pain.  Headache worsened by an upright position.  New onset weakness or numbness of an extremity below the injection site  Hives or difficulty breathing (go to the emergency room)  Inflammation or drainage at the infection site  Severe back/neck pain  Any new symptoms which are concerning to you  Please note:  Although the local anesthetic  injected can often make your back or neck feel good for several hours after the injection, the pain will likely return.  It takes 3-7 days for steroids to work in the epidural space.  You may not notice any pain relief for at least that one week.  If effective, we will often do a series of three injections spaced 3-6 weeks  apart to maximally decrease your pain.  After the initial series, we generally will wait several months before considering a repeat injection of the same type.

## 2020-08-04 ENCOUNTER — Ambulatory Visit
Admission: RE | Admit: 2020-08-04 | Discharge: 2020-08-04 | Disposition: A | Discharge status: Home / Self Care | Payer: 59 | Source: Ambulatory Visit | Attending: Family Medicine | Admitting: Family Medicine

## 2020-08-04 ENCOUNTER — Other Ambulatory Visit: Payer: Self-pay

## 2020-08-04 DIAGNOSIS — M4807 Spinal stenosis, lumbosacral region: Secondary | ICD-10-CM

## 2020-08-04 DIAGNOSIS — M4300 Spondylolysis, site unspecified: Secondary | ICD-10-CM

## 2020-08-04 DIAGNOSIS — M4310 Spondylolisthesis, site unspecified: Secondary | ICD-10-CM

## 2020-08-04 MED ORDER — METHYLPREDNISOLONE ACETATE 40 MG/ML INJ SUSP (RADIOLOG
120.0000 mg | Freq: Once | INTRAMUSCULAR | Status: AC
  Administered 2020-08-04: 120 mg via EPIDURAL

## 2020-08-04 MED ORDER — IOPAMIDOL (ISOVUE-M 200) INJECTION 41%
1.0000 mL | Freq: Once | INTRAMUSCULAR | Status: AC
  Administered 2020-08-04: 1 mL via EPIDURAL

## 2020-08-04 NOTE — Discharge Instructions (Signed)

## 2020-08-05 ENCOUNTER — Other Ambulatory Visit: Payer: Self-pay | Admitting: Family Medicine

## 2020-08-10 ENCOUNTER — Telehealth: Payer: Self-pay | Admitting: Family Medicine

## 2020-08-10 ENCOUNTER — Ambulatory Visit: Payer: 59 | Admitting: Family Medicine

## 2020-08-10 DIAGNOSIS — M4807 Spinal stenosis, lumbosacral region: Secondary | ICD-10-CM

## 2020-08-10 DIAGNOSIS — M4310 Spondylolisthesis, site unspecified: Secondary | ICD-10-CM

## 2020-08-10 DIAGNOSIS — M4300 Spondylolysis, site unspecified: Secondary | ICD-10-CM

## 2020-08-10 NOTE — Telephone Encounter (Signed)
Pt had epidural 10/1. Really does not feel any different/better. This was a little different location than the last epidural (which did provide relief) and pt concerned.  What would we recommend as next step?

## 2020-08-11 ENCOUNTER — Other Ambulatory Visit: Payer: Self-pay | Admitting: Family Medicine

## 2020-08-11 NOTE — Telephone Encounter (Signed)
Patient would like to try a third injection.  He asked if this could go ahead and be ordered.  He also stated that the pain medications that were prescibed have not been easing the pain at all (he only uses them when he plays golf). Is there something else he can try until he is able to have the injection?

## 2020-08-11 NOTE — Telephone Encounter (Signed)
We could try the same kind of injection you had in July as he would like but my notes indicate that that did not last very long either.  Would you like me to order a repeat third injection?  What did the neurosurgeon say?

## 2020-08-14 ENCOUNTER — Ambulatory Visit (INDEPENDENT_AMBULATORY_CARE_PROVIDER_SITE_OTHER): Payer: 59 | Admitting: Family Medicine

## 2020-08-14 ENCOUNTER — Other Ambulatory Visit: Payer: Self-pay

## 2020-08-14 ENCOUNTER — Ambulatory Visit: Payer: 59 | Admitting: Family Medicine

## 2020-08-14 DIAGNOSIS — M4300 Spondylolysis, site unspecified: Secondary | ICD-10-CM

## 2020-08-14 DIAGNOSIS — M4310 Spondylolisthesis, site unspecified: Secondary | ICD-10-CM

## 2020-08-14 MED ORDER — OXYCODONE-ACETAMINOPHEN 5-325 MG PO TABS: 1.0000 | ORAL_TABLET | Freq: Three times a day (TID) | ORAL | 0 refills | Status: DC | PRN

## 2020-08-14 NOTE — Telephone Encounter (Signed)
Lumbar epidural steroid injection ordered.  Please call [facility] at [phone] to schedule.

## 2020-08-14 NOTE — Telephone Encounter (Signed)
Patient called following up on this refill request. 

## 2020-08-14 NOTE — Patient Instructions (Signed)
Thank you for coming in today.  Please call [facility] at [phone] to schedule your spine injection.

## 2020-08-14 NOTE — Telephone Encounter (Signed)
Informed patient

## 2020-08-14 NOTE — Progress Notes (Signed)
    Virtual Visit  I connected with   Gerald [name]  by a phone telemedicine application and verified that I am speaking with the correct person using two identifiers.   I discussed the limitations of evaluation and management by telemedicine and the availability of in person appointments. The patient expressed understanding and agreed to proceed.  ? Location of the provider office ? Location of the patient home ? The names and roles of all persons participating in the visit. Patient and myself   History of Present Illness: Gerald [name] is a 64 y.o. male who would like to discuss back pain and radiculopathy due to spondylolisthesis.  Patient has had 2 epidural steroid injections.  Transforaminal injection and July it lasted 5 to 6 weeks.  Interlaminar injection just recently did not work very well at all.  He said neurosurgical consultation and advised to proceed with laminectomy and fusion.  He would like to delay surgery until after a golf trip in January if possible.  He notes Lyrica and hydrocodone have been very marginal.  He like to try oxycodone if possible.  No significant weakness.    Observations/Objective:  Exam: Normal Speech.     Assessment and Plan: 64 y.o. male with lumbar radiculopathy and back pain due to spondylolisthesis at L5-S1.  Ultimately likely will require surgery.  Will attempt third epidural steroid injection.  Order already placed.  I have given radiologist discretion on level and technique however his he had better response to transforaminal and interlaminar would recommend that.  Recheck back as needed.  Oxycodone prescribed.  PDMP reviewed during this encounter.    Follow Up Instructions:    I discussed the assessment and treatment plan with the patient. The patient was provided an opportunity to ask questions and all were answered. The patient agreed with the plan and demonstrated an understanding of the instructions.   The patient was  advised to call back or seek an in-person evaluation if the symptoms worsen or if the condition fails to improve as anticipated.  Time: 25 mins.  Discussed current status treatment plan and options.

## 2020-08-21 ENCOUNTER — Other Ambulatory Visit: Payer: Self-pay

## 2020-08-21 ENCOUNTER — Other Ambulatory Visit: Payer: Self-pay | Admitting: Family Medicine

## 2020-08-21 ENCOUNTER — Ambulatory Visit
Admission: RE | Admit: 2020-08-21 | Discharge: 2020-08-21 | Disposition: A | Discharge status: Home / Self Care | Payer: 59 | Source: Ambulatory Visit | Attending: Family Medicine | Admitting: Family Medicine

## 2020-08-21 DIAGNOSIS — M4310 Spondylolisthesis, site unspecified: Secondary | ICD-10-CM

## 2020-08-21 DIAGNOSIS — M4807 Spinal stenosis, lumbosacral region: Secondary | ICD-10-CM

## 2020-08-21 DIAGNOSIS — M4300 Spondylolysis, site unspecified: Secondary | ICD-10-CM

## 2020-08-21 MED ORDER — METHYLPREDNISOLONE ACETATE 40 MG/ML INJ SUSP (RADIOLOG
120.0000 mg | Freq: Once | INTRAMUSCULAR | Status: AC
  Administered 2020-08-21: 120 mg via EPIDURAL

## 2020-08-21 MED ORDER — IOPAMIDOL (ISOVUE-M 200) INJECTION 41%
1.0000 mL | Freq: Once | INTRAMUSCULAR | Status: AC
  Administered 2020-08-21: 1 mL via EPIDURAL

## 2020-08-21 NOTE — Discharge Instructions (Signed)

## 2020-08-22 ENCOUNTER — Other Ambulatory Visit: Payer: 59

## 2020-08-24 ENCOUNTER — Other Ambulatory Visit: Payer: Self-pay | Admitting: Family Medicine

## 2020-08-24 MED ORDER — OXYCODONE-ACETAMINOPHEN 5-325 MG PO TABS
1.0000 | ORAL_TABLET | Freq: Three times a day (TID) | ORAL | 0 refills | Status: DC | PRN
Start: 2020-08-24 — End: 2020-09-10

## 2020-08-31 ENCOUNTER — Other Ambulatory Visit: Payer: Self-pay

## 2020-08-31 ENCOUNTER — Ambulatory Visit (INDEPENDENT_AMBULATORY_CARE_PROVIDER_SITE_OTHER): Payer: 59 | Admitting: Family Medicine

## 2020-08-31 DIAGNOSIS — M4310 Spondylolisthesis, site unspecified: Secondary | ICD-10-CM

## 2020-08-31 DIAGNOSIS — M4300 Spondylolysis, site unspecified: Secondary | ICD-10-CM

## 2020-08-31 DIAGNOSIS — M4807 Spinal stenosis, lumbosacral region: Secondary | ICD-10-CM

## 2020-08-31 NOTE — Progress Notes (Signed)
Virtual Visit  via phone Note   I connected with Gerald [name]  today by a telemedicine application and verified that I am speaking with the correct person using two identifiers.  ? Location of the provider office ? Location of the patient home ? The names and roles of all persons participating in the visit.  Self and patient   I discussed the limitations, risks, security and privacy concerns of performing an evaluation and management service by telephone and the availability of in person appointments. I also discussed with the patient that there may be a patient responsible charge related to this service. The patient expressed understanding and agreed to proceed.    I discussed the limitations of evaluation and management by telemedicine and the availability of in person appointments. The patient expressed understanding and agreed to proceed.  History of Present Illness: Gerald [name] is a 64 y.o. male who would like to discuss his status following a total of 3 lumbar epidurals over the past 3 months w/ his most recent being a R L5-S1 epidural on 08/21/20.  He notes continued leg pain but denies significant weakness.  He said 2 consultations with neurosurgery both recommending laminectomy and fusion.  He has some upcoming trips that he would like to attend to if possible including a trip to [place] November 11-20 and a trip to [place] all throughout January.  His goal is to try to get delay surgery until after his trip to [place] in January.  He is also currently receiving physical therapy.     Observations/Objective: Exam: Normal Speech.    Lab and Radiology Results MR Lumbar Spine Wo Contrast  Result Date: 07/25/2020 CLINICAL DATA:  Low back pain extending into the right lower extremity. EXAM: MRI LUMBAR SPINE WITHOUT CONTRAST TECHNIQUE: Multiplanar, multisequence MR imaging of the lumbar spine was performed. No intravenous contrast was administered. COMPARISON:  MRI of  lumbar spine 07/19/2017 FINDINGS: Segmentation: 5 non rib-bearing lumbar type vertebral bodies are present. The lowest fully formed vertebral body is L5. Alignment: Grade 1 anterolisthesis at L5-S1 has increased, now measuring 5 mm. No other significant listhesis is present. Some straightening of the normal lumbar lordosis is stable. Dextroconvex curvature of the lumbar spine is centered at L2-3. Vertebrae: Progressive edematous endplate marrow changes are present at L5-S1. Chronic fatty endplate marrow changes present along the superior endplate of L2 and L3. Conus medullaris and cauda equina: Conus extends to the L1 level. Conus and cauda equina appear normal. Paraspinal and other soft tissues: Limited imaging the abdomen is unremarkable. There is no significant adenopathy. No solid organ lesions are present. Disc levels: T12-L1: Negative. L1-2: Negative. L2-3: Slight disc bulging is present. No significant stenosis is present. L3-4: Mild disc bulging is similar the prior exam. No significant stenosis is present. L4-5: A far left lateral disc protrusion demonstrates some progression. The central canal is patent. Moderate left foraminal narrowing has progressed. The right foramen is patent. L5-S1: Further anterolisthesis is associated bilateral pars defects. Central canal is patent. Moderate foraminal narrowing bilaterally demonstrates slight progression. IMPRESSION: 1. Further anterolisthesis and bilateral pars defects at L5-S1 with slight progression of moderate foraminal narrowing bilaterally. 2. Progressive far left lateral disc protrusion at L4-5 with moderate left foraminal narrowing. 3. Mild disc bulging at L2-3 and L3-4 without significant stenosis at these levels. Electronically Signed   By: San Morelle M.D.   On: 07/25/2020 11:20   DG Epidural/Nerve Root  Result Date: 08/21/2020 CLINICAL DATA:  Right  L5 radiculopathy. Bilateral pars defects. Patient reports significant relief pain from the  prior injection. Pain has recurred. Patient states previous interlaminar epidural injection do not relieved his pain. FLUOROSCOPY TIME:  Radiation Exposure Index (as provided by the fluoroscopic device): 39.12 uGy*m2 PROCEDURE: SELECTIVE NERVE ROOT AND TRANSFORAMINAL EPIDURAL STEROID INJECTION: A transforaminal approach was performed on the right at the L5-S1 foramen. The overlying skin was cleansed and anesthetized. A 22 gauge spinal needle was advanced into the foramen from a lateral oblique approach. Injection of 2cc of Omnipaque 180 outlined the nerve root and extended into the epidural space. No vascular or intrathecal spread is evident. I then injected 120 mg of Depo-Medrol and 1.5 ml of 1% lidocaine. The patient tolerated the procedure without evidence for complication. The patient was observed for 20 minutes prior to discharge in stable neurologic condition. IMPRESSION: Technically successful selective nerve root block and transforaminal epidural steroid injection on theright at the L5-S1 foramen. Electronically Signed   By: San Morelle M.D.   On: 08/21/2020 09:59   DG INJECT DIAG/THERA/INC NEEDLE/CATH/PLC EPI/LUMB/SAC W/IMG  Result Date: 08/04/2020 CLINICAL DATA:  Lumbosacral spondylosis without myelopathy. Right buttock pain radiating down the right leg to the calf. 90% relief following the transforaminal epidural injection in 05/2020, however the duration of relief obtained from multiple prior epidural injections has decreased over time. Request for an interlaminar approach today. FLUOROSCOPY TIME:  Fluoroscopy Time: 14 seconds Radiation Exposure Index: 18.32 microGray*m^2 PROCEDURE: The procedure, risks, benefits, and alternatives were explained to the patient. Questions regarding the procedure were encouraged and answered. The patient understands and consents to the procedure. LUMBAR EPIDURAL INJECTION: An interlaminar approach was performed on the right at L5-S1. The overlying skin was  cleansed and anesthetized. A 3.5 inch 20 gauge epidural needle was advanced using loss-of-resistance technique. DIAGNOSTIC EPIDURAL INJECTION: Injection of Isovue-M 200 shows a good epidural pattern with spread above and below the level of needle placement, primarily on the right. No vascular opacification is seen. THERAPEUTIC EPIDURAL INJECTION: 120 mg of Depo-Medrol mixed with 2.5 mL of 1% lidocaine were instilled. The procedure was well-tolerated, and the patient was discharged thirty minutes following the injection in good condition. COMPLICATIONS: None IMPRESSION: Technically successful interlaminar epidural injection on the right at L5-S1. Electronically Signed   By: Logan Bores M.D.   On: 08/04/2020 10:30   I, [name], personally (independently) visualized and performed the interpretation of the images attached in this note.   Assessment and Plan: 64 y.o. 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  Had a lengthy conversation with patient today about prognosis and potential options.  He is effectively failing conservative management and next option is surgery as recommended by surgery.  He has maxed out his epidural steroid injections and cannot get another one until January with [facility].  Fortunately he is not having weakness so aside from pain there is no pressing reason to proceed with surgery.  Expressed that it is okay to try to wait however am not optimistic about further conservative management.  PDMP reviewed during this encounter. No orders of the defined types were placed in this encounter.  No orders of the defined types were placed in this encounter.   Follow Up Instructions:    I discussed the assessment and treatment plan with the patient. The patient was provided an opportunity to ask questions and all were answered. The patient agreed with the plan and demonstrated an understanding of the instructions.   The patient  was advised to  call back or seek an in-person evaluation if the symptoms worsen or if the condition fails to improve as anticipated.  Time: 40 mins total time    Historical information moved to improve visibility of documentation.  Past Medical History:  Diagnosis Date  . Deviated septum 09/13/14   severe  . Duodenal ulcer   . Esophageal stricture   . GERD (gastroesophageal reflux disease)   . Hyperlipemia    no meds   . Leg length discrepancy   . PUD (peptic ulcer disease)   . Smoker    Past Surgical History:  Procedure Laterality Date  . BIOPSY SHOULDER Bilateral 03/25/2018   superficial basal cell carcinoma  . COLONOSCOPY  2007  . KNEE ARTHROSCOPY    . POLYPECTOMY    . UPPER GASTROINTESTINAL ENDOSCOPY  2015   Social History   Tobacco Use  . Smoking status: Former Smoker    Packs/day: 0.25    Types: Cigarettes    Quit date: 06/02/2016    Years since quitting: 4.2  . Smokeless tobacco: Never Used  . Tobacco comment: Smokes occ.Marland KitchenCounseling sheet given 01-2012   Substance Use Topics  . Alcohol use: Yes    Alcohol/week: 0.0 standard drinks    Comment: occasional   family history includes Arthritis in his mother; Colon polyps in his mother; Stroke in his father.  Medications: Current Outpatient Medications  Medication Sig Dispense Refill  . diclofenac (VOLTAREN) 75 MG EC tablet TAKE 1 TABLET TWICE A DAY 180 tablet 3  . esomeprazole (NEXIUM) 40 MG capsule TAKE 1 CAPSULE BY MOUTH DAILY AT NOON 30 capsule 0  . oxyCODONE-acetaminophen (PERCOCET/ROXICET) 5-325 MG tablet Take 1 tablet by mouth every 8 (eight) hours as needed for severe pain. 15 tablet 0  . pregabalin (LYRICA) 150 MG capsule Take 1 capsule (150 mg total) by mouth 2 (two) times daily as needed. 60 capsule 3  . tadalafil (CIALIS) 20 MG tablet TAKE ONE TABLET BY MOUTH DAILY AS NEEDED FOR ERECTILE DYSFUNCTION 30 tablet 1   Current Facility-Administered Medications  Medication Dose Route Frequency Provider Last Rate Last  Admin  . 0.9 %  sodium chloride infusion  500 mL Intravenous Once Irene Shipper, MD       No Known Allergies                                                                 The above documentation has been reviewed and is accurate and complete [name], M.D.

## 2020-09-08 ENCOUNTER — Other Ambulatory Visit: Payer: Self-pay | Admitting: Family Medicine

## 2020-09-10 ENCOUNTER — Other Ambulatory Visit: Payer: Self-pay | Admitting: Family Medicine

## 2020-09-11 MED ORDER — OXYCODONE-ACETAMINOPHEN 5-325 MG PO TABS
1.0000 | ORAL_TABLET | Freq: Three times a day (TID) | ORAL | 0 refills | Status: DC | PRN
Start: 2020-09-11 — End: 2020-10-11

## 2020-09-11 NOTE — Telephone Encounter (Signed)
Please advise 

## 2020-09-26 ENCOUNTER — Other Ambulatory Visit: Payer: Self-pay | Admitting: Neurosurgery

## 2020-09-28 ENCOUNTER — Other Ambulatory Visit: Payer: Self-pay | Admitting: Family Medicine

## 2020-09-28 DIAGNOSIS — N529 Male erectile dysfunction, unspecified: Secondary | ICD-10-CM

## 2020-10-02 NOTE — Telephone Encounter (Signed)
[facility] is requesting to fill pt tadalafil. Please advise [facility]

## 2020-10-03 NOTE — Pre-Procedure Instructions (Signed)
[facility] [address], [place] [address] Phone: [phone] Fax: [phone]     Your procedure is scheduled on Tuesday, 10/10/20 from 11:58 AM- 3:11 PM.  Report to [facility] Main Entrance "A" at 09:55 A.M., and check in at the Admitting office.  Call this number if you have problems the morning of surgery:  [phone]  Call [phone] if you have any questions prior to your surgery date Monday-Friday 8am-4pm.    Remember:  Do not eat or drink after midnight the night before your surgery.     Take these medicines the morning of surgery with A SIP OF WATER: esomeprazole (NEXIUM) gabapentin (NEURONTIN)  pregabalin (LYRICA)  IF NEEDED: oxyCODONE-acetaminophen (PERCOCET/ROXICET)  oxymetazoline (AFRIN) nasal spray   As of today, STOP taking any Aspirin (unless otherwise instructed by your surgeon) Aleve, Naproxen, Ibuprofen, Motrin, Advil, Goody's, BC's, all herbal medications, fish oil, and all vitamins.      The Morning of Surgery:                Do not wear jewelry.            Do not wear lotions, powders, colognes, or deodorant.            Men may shave face and neck.            Do not bring valuables to the hospital.            [facility] is not responsible for any belongings or valuables.  Do NOT Smoke (Tobacco/Vaping) or drink Alcohol 24 hours prior to your procedure.  If you use a CPAP at night, you may bring all equipment for your overnight stay.   Contacts, glasses, dentures or bridgework may not be worn into surgery.      For patients admitted to the hospital, discharge time will be determined by your treatment team.   Patients discharged the day of surgery will not be allowed to drive home, and someone needs to stay with them for 24 hours.    Special instructions:   [facility]- Preparing For Surgery  Before surgery, you can play an important role. Because skin is not  sterile, your skin needs to be as free of germs as possible. You can reduce the number of germs on your skin by washing with CHG (chlorahexidine gluconate) Soap before surgery.  CHG is an antiseptic cleaner which kills germs and bonds with the skin to continue killing germs even after washing.    Oral Hygiene is also important to reduce your risk of infection.  Remember - BRUSH YOUR TEETH THE MORNING OF SURGERY WITH YOUR REGULAR TOOTHPASTE  Please do not use if you have an allergy to CHG or antibacterial soaps. If your skin becomes reddened/irritated stop using the CHG.  Do not shave (including legs and underarms) for at least 48 hours prior to first CHG shower. It is OK to shave your face.  Please follow these instructions carefully.   1. Shower the NIGHT BEFORE SURGERY and the MORNING OF SURGERY with CHG Soap.   2. If you chose to wash your hair, wash your hair first as usual with your normal shampoo.  3. After you shampoo, rinse your hair and body thoroughly to remove the shampoo.  4. Use CHG as you would any other liquid soap. You can apply CHG directly to the skin and wash gently with a scrungie or a clean  washcloth.   5. Apply the CHG Soap to your body ONLY FROM THE NECK DOWN.  Do not use on open wounds or open sores. Avoid contact with your eyes, ears, mouth and genitals (private parts). Wash Face and genitals (private parts)  with your normal soap.   6. Wash thoroughly, paying special attention to the area where your surgery will be performed.  7. Thoroughly rinse your body with warm water from the neck down.  8. DO NOT shower/wash with your normal soap after using and rinsing off the CHG Soap.  9. Pat yourself dry with a CLEAN TOWEL.  10. Wear CLEAN PAJAMAS to bed the night before surgery  11. Place CLEAN SHEETS on your bed the night of your first shower and DO NOT SLEEP WITH PETS.   Day of Surgery: SHOWER Wear Clean/Comfortable clothing the morning of surgery Do not apply  any deodorants/lotions.   Remember to brush your teeth WITH YOUR REGULAR TOOTHPASTE.   Please read over the following fact sheets that you were given.

## 2020-10-04 ENCOUNTER — Encounter (HOSPITAL_COMMUNITY): Payer: Self-pay

## 2020-10-04 ENCOUNTER — Encounter (HOSPITAL_COMMUNITY)
Admission: RE | Admit: 2020-10-04 | Discharge: 2020-10-04 | Disposition: A | Discharge status: Home / Self Care | Payer: 59 | Source: Ambulatory Visit | Attending: Neurosurgery | Admitting: Neurosurgery

## 2020-10-04 ENCOUNTER — Other Ambulatory Visit: Payer: Self-pay

## 2020-10-04 DIAGNOSIS — Z01812 Encounter for preprocedural laboratory examination: Secondary | ICD-10-CM | POA: Insufficient documentation

## 2020-10-04 HISTORY — DX: Other complications of anesthesia, initial encounter: T88.59XA

## 2020-10-04 HISTORY — DX: Nausea with vomiting, unspecified: R11.2

## 2020-10-04 HISTORY — DX: Other specified postprocedural states: Z98.890

## 2020-10-04 LAB — CBC
HCT: 38.8 % — ABNORMAL LOW (ref 39.0–52.0)
Hemoglobin: 12.9 g/dL — ABNORMAL LOW (ref 13.0–17.0)
MCH: 32.7 pg (ref 26.0–34.0)
MCHC: 33.2 g/dL (ref 30.0–36.0)
MCV: 98.5 fL (ref 80.0–100.0)
Platelets: 293 10*3/uL (ref 150–400)
RBC: 3.94 MIL/uL — ABNORMAL LOW (ref 4.22–5.81)
RDW: 13.2 % (ref 11.5–15.5)
WBC: 6.3 10*3/uL (ref 4.0–10.5)
nRBC: 0 % (ref 0.0–0.2)

## 2020-10-04 LAB — TYPE AND SCREEN
ABO/RH(D): A POS
Antibody Screen: NEGATIVE

## 2020-10-04 LAB — SURGICAL PCR SCREEN
MRSA, PCR: NEGATIVE
Staphylococcus aureus: NEGATIVE

## 2020-10-04 NOTE — Progress Notes (Signed)
PCP - Jill Alexanders Cardiologist - denies  Chest x-ray - N/A EKG - N/A Stress Test - denies ECHO - denies Cardiac Cath - denies  Sleep Study - denies  Aspirin Instructions: Patient instructed to hold all Aspirin, NSAID's, herbal medications, fish oil and vitamins 7 days prior to surgery.  Covid test- 10/06/20 at at [facility]. Pt instructed to remain in their car. Educated on Transport planner until [employer].   Anesthesia review:   Patient denies shortness of breath, fever, cough and chest pain at PAT appointment   Patient verbalized understanding of instructions that were given to them at the PAT appointment. Patient was also instructed that they will need to review over the PAT instructions again at home before surgery.

## 2020-10-04 NOTE — Pre-Procedure Instructions (Signed)
[facility] [address] [place] [address] Phone: [phone] Fax: [phone]     Your procedure is scheduled on Tuesday December 7th.  Report to [facility] Main Entrance "A" at 09:55 A.M., and check in at the Admitting office.  Call this number if you have problems the morning of surgery:  [phone]  Call [phone] if you have any questions prior to your surgery date Monday-Friday 8am-4pm.    Remember:  Do not eat or drink after midnight the night before your surgery.     Take these medicines the morning of surgery with A SIP OF WATER: esomeprazole (NEXIUM) gabapentin (NEURONTIN)  pregabalin (LYRICA)  IF NEEDED: oxyCODONE-acetaminophen (PERCOCET/ROXICET)  oxymetazoline (AFRIN) nasal spray   As of today, STOP taking any Aspirin (unless otherwise instructed by your surgeon) Aleve, Naproxen, Ibuprofen, Motrin, Advil, Goody's, BC's, all herbal medications, fish oil, and all vitamins.      The Morning of Surgery:                Do not wear jewelry.            Do not wear lotions, powders, colognes, or deodorant.            Men may shave face and neck.            Do not bring valuables to the hospital.            [facility] is not responsible for any belongings or valuables.  Do NOT Smoke (Tobacco/Vaping) or drink Alcohol 24 hours prior to your procedure.  If you use a CPAP at night, you may bring all equipment for your overnight stay.   Contacts, glasses, dentures or bridgework may not be worn into surgery.      For patients admitted to the hospital, discharge time will be determined by your treatment team.   Patients discharged the day of surgery will not be allowed to drive home, and someone needs to stay with them for 24 hours.    Special instructions:   [facility]- Preparing For Surgery  Before surgery, you can play an important role. Because skin is not sterile, your skin  needs to be as free of germs as possible. You can reduce the number of germs on your skin by washing with CHG (chlorahexidine gluconate) Soap before surgery.  CHG is an antiseptic cleaner which kills germs and bonds with the skin to continue killing germs even after washing.    Oral Hygiene is also important to reduce your risk of infection.  Remember - BRUSH YOUR TEETH THE MORNING OF SURGERY WITH YOUR REGULAR TOOTHPASTE  Please do not use if you have an allergy to CHG or antibacterial soaps. If your skin becomes reddened/irritated stop using the CHG.  Do not shave (including legs and underarms) for at least 48 hours prior to first CHG shower. It is OK to shave your face.  Please follow these instructions carefully.   1. Shower the NIGHT BEFORE SURGERY and the MORNING OF SURGERY with CHG Soap.   2. If you chose to wash your hair, wash your hair first as usual with your normal shampoo.  3. After you shampoo, rinse your hair and body thoroughly to remove the shampoo.  4. Use CHG as you would any other liquid soap. You can apply CHG directly to the skin and wash gently with a scrungie or a clean washcloth.   5.  Apply the CHG Soap to your body ONLY FROM THE NECK DOWN.  Do not use on open wounds or open sores. Avoid contact with your eyes, ears, mouth and genitals (private parts). Wash Face and genitals (private parts)  with your normal soap.   6. Wash thoroughly, paying special attention to the area where your surgery will be performed.  7. Thoroughly rinse your body with warm water from the neck down.  8. DO NOT shower/wash with your normal soap after using and rinsing off the CHG Soap.  9. Pat yourself dry with a CLEAN TOWEL.  10. Wear CLEAN PAJAMAS to bed the night before surgery  11. Place CLEAN SHEETS on your bed the night of your first shower and DO NOT SLEEP WITH PETS.   Day of Surgery: SHOWER Wear Clean/Comfortable clothing the morning of surgery Do not apply any  deodorants/lotions.   Remember to brush your teeth WITH YOUR REGULAR TOOTHPASTE.   Please read over the following fact sheets that you were given.

## 2020-10-05 ENCOUNTER — Other Ambulatory Visit: Payer: Self-pay | Admitting: Family Medicine

## 2020-10-06 ENCOUNTER — Other Ambulatory Visit (HOSPITAL_COMMUNITY)
Admission: RE | Admit: 2020-10-06 | Discharge: 2020-10-06 | Disposition: A | Discharge status: Home / Self Care | Payer: PRIVATE HEALTH INSURANCE | Source: Ambulatory Visit | Attending: Neurosurgery | Admitting: Neurosurgery

## 2020-10-06 DIAGNOSIS — Z01812 Encounter for preprocedural laboratory examination: Secondary | ICD-10-CM | POA: Insufficient documentation

## 2020-10-06 DIAGNOSIS — Z20822 Contact with and (suspected) exposure to covid-19: Secondary | ICD-10-CM | POA: Diagnosis not present

## 2020-10-07 LAB — SARS CORONAVIRUS 2 (TAT 6-24 HRS): SARS Coronavirus 2: NEGATIVE

## 2020-10-09 NOTE — H&P (Signed)
Chief Complaint   No chief complaint on file.   HPI   HPI: Gerald [name] is a 64 y.o. maleWith chronic right-sided leg pain and to a lesser extent lower back pain likely secondary to known lytic L5 spondylolisthesis.  We have been managing this conservatively with p.o. medications and epidural steroid injections.   He has previously undergone a course of PT. While he does get some mild improvement in symptoms with ESI, improvement is transient.  He therefore elected to proceed with lumbar decompression and fusion.   He presents today for surgery.  He is without any concerns.  Patient Active Problem List   Diagnosis Date Noted  . History of colonic polyps 06/21/2019  . Neuroforaminal stenosis of lumbosacral spine 06/04/2018  . Glucose intolerance 06/04/2018  . Pars defect with spondylolisthesis 05/16/2017  . Piriformis syndrome, right 05/12/2017  . Degenerative lumbar spinal stenosis 03/12/2017  . Nonallopathic lesion of lumbosacral region 03/12/2017  . Nonallopathic lesion of sacral region 03/12/2017  . Nonallopathic lesion of thoracic region 03/12/2017  . History of ischemic bowel disease 10/07/2015  . Former smoker 10/12/2012  . GERD 10/30/2010    PMH: Past Medical History:  Diagnosis Date  . Complication of anesthesia   . Deviated septum 09/13/14   severe  . Duodenal ulcer   . Esophageal stricture   . GERD (gastroesophageal reflux disease)   . Hyperlipemia    no meds   . Leg length discrepancy   . PONV (postoperative nausea and vomiting)   . PUD (peptic ulcer disease)   . Smoker     PSH: Past Surgical History:  Procedure Laterality Date  . BIOPSY SHOULDER Bilateral 03/25/2018   superficial basal cell carcinoma  . COLONOSCOPY  2007  . EYE SURGERY Right    retinal tear  . KNEE ARTHROSCOPY    . POLYPECTOMY    . UPPER GASTROINTESTINAL ENDOSCOPY  2015    No medications prior to admission.    SH: Social History   Tobacco Use  . Smoking status:  Former Smoker    Packs/day: 0.25    Types: Cigarettes    Quit date: 06/02/2016    Years since quitting: 4.3  . Smokeless tobacco: Never Used  . Tobacco comment: Smokes occ.Marland KitchenCounseling sheet given 01-2012   Vaping Use  . Vaping Use: Never used  Substance Use Topics  . Alcohol use: Yes    Alcohol/week: 0.0 standard drinks    Comment: occasional  . Drug use: No    MEDS: Prior to Admission medications   Medication Sig Start Date End Date Taking? Authorizing Provider  oxyCODONE-acetaminophen (PERCOCET/ROXICET) 5-325 MG tablet Take 1 tablet by mouth every 8 (eight) hours as needed for severe pain. 09/11/20  Yes Gregor Hams, MD  oxymetazoline (AFRIN) 0.05 % nasal spray Place 1 spray into both nostrils 2 (two) times daily as needed for congestion.   Yes [provider]  tadalafil (CIALIS) 20 MG tablet TAKE ONE TABLET BY MOUTH DAILY AS NEEDED FOR ERECTILE DYSFUNCTION Patient taking differently: Take 20 mg by mouth daily as needed for erectile dysfunction.  10/02/20  Yes Denita Lung, MD  esomeprazole (NEXIUM) 40 MG capsule TAKE ONE CAPSULE BY MOUTH DAILY AT NOON 10/05/20   Denita Lung, MD  gabapentin (NEURONTIN) 100 MG capsule Take 200 mg by mouth 2 (two) times daily.    [provider]  ibuprofen (ADVIL) 200 MG tablet Take 400 mg by mouth every 8 (eight) hours as needed for headache.  [provider]  pregabalin (LYRICA) 150 MG capsule Take 1 capsule (150 mg total) by mouth 2 (two) times daily as needed. Patient taking differently: Take 150 mg by mouth 2 (two) times daily.  07/31/20   Gregor Hams, MD    ALLERGY: No Known Allergies  Social History   Tobacco Use  . Smoking status: Former Smoker    Packs/day: 0.25    Types: Cigarettes    Quit date: 06/02/2016    Years since quitting: 4.3  . Smokeless tobacco: Never Used  . Tobacco comment: Smokes occ.Marland KitchenCounseling sheet given 01-2012   Substance Use Topics  . Alcohol use: Yes    Alcohol/week: 0.0  standard drinks    Comment: occasional     Family History  Problem Relation Age of Onset  . Colon polyps Mother   . Arthritis Mother   . Stroke Father   . Colon cancer Neg Hx   . Esophageal cancer Neg Hx   . Rectal cancer Neg Hx   . Stomach cancer Neg Hx      ROS   ROS  Exam   There were no vitals filed for this visit. General appearance: WDWN, NAD GCS: 15   - Eyes: 1- no eye opening, 2- eyes open to pain, 3-eyes open to command or shout, 4- eyes open spontaneously   - Verbal response: 1- no verbal response, 2- incomprehnsible speech/sound, 3- inappropriate response, 4- confused, but able to answer questions, 5= oriented  - Motor: 1= No motor, 2- extensor response, 3- spastic flexion, 4- withdraws from pain, 5= purposeful movement to pain, 6= obeys command Eyes: No scleral injection Cardiovascular: Regular rate and rhythm without murmurs, rubs, gallops. No edema or variciosities. Distal pulses normal. Pulmonary: Effort normal, non-labored breathing Musculoskeletal:     Muscle tone upper extremities: Normal    Muscle tone lower extremities: Normal    Motor exam: Upper Extremities Deltoid Bicep Tricep Grip  Right 5/5 5/5 5/5 5/5  Left 5/5 5/5 5/5 5/5   Lower Extremity IP Quad PF DF EHL  Right 5/5 5/5 5/5 5/5 5/5  Left 5/5 5/5 5/5 5/5 5/5   Neurological Mental Status:    - Patient is awake, alert, oriented to person, place, month, year, and situation    - Patient is able to give a clear and coherent history.    - No signs of aphasia or neglect Cranial Nerves    - II: Visual Fields are full. PERRL    - III/IV/VI: EOMI without ptosis or diploplia.     - V: Facial sensation is grossly normal    - VII: Facial movement is symmetric.     - VIII: hearing is intact to voice    - X: Uvula elevates symmetrically    - XI: Shoulder shrug is symmetric.    - XII: tongue is midline without atrophy or fasciculations.  Sensory: Sensation grossly intact to LT  Results -  Imaging/Labs   No results found for this or any previous visit (from the past 48 hour(s)).  No results found.  IMAGING: MRI of the lumbar spine dated 07/25/2020 was personally reviewed.  This demonstrates primary pathology at L5-S1 where there is significant disc degenerative disease with loss of height and reactive endplate change.  There are bilateral pars defects.  There is resultant bilateral facet arthropathy and bilateral foraminal stenosis.  Dynamic lumbar spine x-rays done in the office today were personally reviewed.  These demonstrate mobility of the spondylolisthesis at L5-S1.  Impression/Plan  64 y.o. male with primarily right-sided radiculopathy related to lytic spondylolisthesis with degenerative disc height loss and foraminal stenosis at L5-S1.  Patient has failed multiple conservative treatments with the exception of epidural steroid injections which to this point have provided good relief, although transient. Based on lack of long lasting improvement, he has elected to proceed with L5-S1 decompression and fusion.  We have reviewed in detail the indications for surgery, the associated risks, benefits and alternatives at length in the office.  We have discussed expected postop course and recovery. All questions today were answered and consent was obtained.  Consuella Lose, MD [facility]

## 2020-10-10 ENCOUNTER — Inpatient Hospital Stay (HOSPITAL_COMMUNITY)
Admission: RE | Admit: 2020-10-10 | Discharge: 2020-10-11 | DRG: 460 | Disposition: A | Discharge status: Home / Self Care | Payer: 59 | Attending: Neurosurgery | Admitting: Neurosurgery

## 2020-10-10 ENCOUNTER — Encounter (HOSPITAL_COMMUNITY): Payer: Self-pay | Admitting: Neurosurgery

## 2020-10-10 ENCOUNTER — Inpatient Hospital Stay (HOSPITAL_COMMUNITY): Payer: 59

## 2020-10-10 ENCOUNTER — Inpatient Hospital Stay (HOSPITAL_COMMUNITY): Payer: 59 | Admitting: Anesthesiology

## 2020-10-10 ENCOUNTER — Other Ambulatory Visit: Payer: Self-pay

## 2020-10-10 ENCOUNTER — Encounter (HOSPITAL_COMMUNITY)
Admission: RE | Disposition: A | Discharge status: Home / Self Care | Payer: Self-pay | Source: Home / Self Care | Attending: Neurosurgery

## 2020-10-10 ENCOUNTER — Other Ambulatory Visit: Payer: Self-pay | Admitting: Neurosurgery

## 2020-10-10 DIAGNOSIS — Z823 Family history of stroke: Secondary | ICD-10-CM | POA: Diagnosis not present

## 2020-10-10 DIAGNOSIS — M48062 Spinal stenosis, lumbar region with neurogenic claudication: Secondary | ICD-10-CM | POA: Diagnosis present

## 2020-10-10 DIAGNOSIS — Z87891 Personal history of nicotine dependence: Secondary | ICD-10-CM | POA: Diagnosis not present

## 2020-10-10 DIAGNOSIS — Z8711 Personal history of peptic ulcer disease: Secondary | ICD-10-CM | POA: Diagnosis not present

## 2020-10-10 DIAGNOSIS — Z8261 Family history of arthritis: Secondary | ICD-10-CM

## 2020-10-10 DIAGNOSIS — M4316 Spondylolisthesis, lumbar region: Secondary | ICD-10-CM | POA: Diagnosis present

## 2020-10-10 DIAGNOSIS — M5416 Radiculopathy, lumbar region: Secondary | ICD-10-CM | POA: Diagnosis present

## 2020-10-10 DIAGNOSIS — K219 Gastro-esophageal reflux disease without esophagitis: Secondary | ICD-10-CM | POA: Diagnosis present

## 2020-10-10 DIAGNOSIS — M4807 Spinal stenosis, lumbosacral region: Secondary | ICD-10-CM | POA: Diagnosis present

## 2020-10-10 DIAGNOSIS — Z419 Encounter for procedure for purposes other than remedying health state, unspecified: Secondary | ICD-10-CM

## 2020-10-10 DIAGNOSIS — M5417 Radiculopathy, lumbosacral region: Secondary | ICD-10-CM | POA: Diagnosis present

## 2020-10-10 LAB — ABO/RH: ABO/RH(D): A POS

## 2020-10-10 SURGERY — POSTERIOR LUMBAR FUSION 1 LEVEL
Anesthesia: General | Site: Spine Lumbar

## 2020-10-10 MED ORDER — METHOCARBAMOL 1000 MG/10ML IJ SOLN
500.0000 mg | Freq: Four times a day (QID) | INTRAVENOUS | Status: DC | PRN
  Filled 2020-10-10: qty 5

## 2020-10-10 MED ORDER — BISACODYL 10 MG RE SUPP: 10.0000 mg | Freq: Every day | RECTAL | Status: DC | PRN

## 2020-10-10 MED ORDER — FENTANYL CITRATE (PF) 250 MCG/5ML IJ SOLN
INTRAMUSCULAR | Status: AC
  Filled 2020-10-10: qty 5

## 2020-10-10 MED ORDER — DEXMEDETOMIDINE (PRECEDEX) IN NS 20 MCG/5ML (4 MCG/ML) IV SYRINGE
PREFILLED_SYRINGE | INTRAVENOUS | Status: AC
  Filled 2020-10-10: qty 5

## 2020-10-10 MED ORDER — CHLORHEXIDINE GLUCONATE CLOTH 2 % EX PADS: 6.0000 | MEDICATED_PAD | Freq: Once | CUTANEOUS | Status: DC

## 2020-10-10 MED ORDER — PROPOFOL 10 MG/ML IV BOLUS
INTRAVENOUS | Status: AC
  Filled 2020-10-10: qty 40

## 2020-10-10 MED ORDER — HYDROXYZINE HCL 50 MG/ML IM SOLN
50.0000 mg | Freq: Four times a day (QID) | INTRAMUSCULAR | Status: DC | PRN
  Administered 2020-10-10: 50 mg via INTRAMUSCULAR
  Filled 2020-10-10: qty 1

## 2020-10-10 MED ORDER — SODIUM CHLORIDE 0.9 % IV SOLN: 250.0000 mL | INTRAVENOUS | Status: DC

## 2020-10-10 MED ORDER — GABAPENTIN 100 MG PO CAPS
200.0000 mg | ORAL_CAPSULE | Freq: Two times a day (BID) | ORAL | Status: DC
  Administered 2020-10-10: 200 mg via ORAL
  Filled 2020-10-10: qty 2

## 2020-10-10 MED ORDER — PANTOPRAZOLE SODIUM 40 MG PO PACK
40.0000 mg | PACK | Freq: Every day | ORAL | Status: DC
  Filled 2020-10-10: qty 20

## 2020-10-10 MED ORDER — SODIUM CHLORIDE 0.9% FLUSH: 3.0000 mL | Freq: Two times a day (BID) | INTRAVENOUS | Status: DC

## 2020-10-10 MED ORDER — SENNA 8.6 MG PO TABS
1.0000 | ORAL_TABLET | Freq: Two times a day (BID) | ORAL | Status: DC
  Administered 2020-10-10: 8.6 mg via ORAL
  Filled 2020-10-10: qty 1

## 2020-10-10 MED ORDER — OXYCODONE HCL 5 MG PO TABS
5.0000 mg | ORAL_TABLET | ORAL | Status: DC | PRN
  Administered 2020-10-10 – 2020-10-11 (×5): 10 mg via ORAL
  Filled 2020-10-10 (×5): qty 2

## 2020-10-10 MED ORDER — SUGAMMADEX SODIUM 200 MG/2ML IV SOLN
INTRAVENOUS | Status: DC | PRN
  Administered 2020-10-10: 50 mg via INTRAVENOUS
  Administered 2020-10-10: 20 mg via INTRAVENOUS
  Administered 2020-10-10 (×2): 50 mg via INTRAVENOUS

## 2020-10-10 MED ORDER — SENNOSIDES-DOCUSATE SODIUM 8.6-50 MG PO TABS: 1.0000 | ORAL_TABLET | Freq: Every evening | ORAL | Status: DC | PRN

## 2020-10-10 MED ORDER — MENTHOL 3 MG MT LOZG: 1.0000 | LOZENGE | OROMUCOSAL | Status: DC | PRN

## 2020-10-10 MED ORDER — HYDROMORPHONE HCL 1 MG/ML IJ SOLN
0.5000 mg | INTRAMUSCULAR | Status: DC | PRN
  Administered 2020-10-10: 1 mg via INTRAVENOUS
  Filled 2020-10-10: qty 1

## 2020-10-10 MED ORDER — HYDROCODONE-ACETAMINOPHEN 5-325 MG PO TABS: 1.0000 | ORAL_TABLET | ORAL | Status: DC | PRN

## 2020-10-10 MED ORDER — ACETAMINOPHEN 650 MG RE SUPP: 650.0000 mg | RECTAL | Status: DC | PRN

## 2020-10-10 MED ORDER — FENTANYL CITRATE (PF) 100 MCG/2ML IJ SOLN
INTRAMUSCULAR | Status: AC
  Filled 2020-10-10: qty 2

## 2020-10-10 MED ORDER — CHLORHEXIDINE GLUCONATE 0.12 % MT SOLN
15.0000 mL | Freq: Once | OROMUCOSAL | Status: AC
  Administered 2020-10-10: 15 mL via OROMUCOSAL
  Filled 2020-10-10: qty 15

## 2020-10-10 MED ORDER — ONDANSETRON HCL 4 MG/2ML IJ SOLN: 4.0000 mg | Freq: Four times a day (QID) | INTRAMUSCULAR | Status: DC | PRN

## 2020-10-10 MED ORDER — LACTATED RINGERS IV SOLN: INTRAVENOUS | Status: DC

## 2020-10-10 MED ORDER — METHOCARBAMOL 500 MG PO TABS
500.0000 mg | ORAL_TABLET | Freq: Four times a day (QID) | ORAL | Status: DC | PRN
  Administered 2020-10-10 – 2020-10-11 (×3): 500 mg via ORAL
  Filled 2020-10-10 (×3): qty 1

## 2020-10-10 MED ORDER — DIPHENHYDRAMINE-APAP (SLEEP) 25-500 MG PO TABS: 1.0000 | ORAL_TABLET | Freq: Every evening | ORAL | Status: DC | PRN

## 2020-10-10 MED ORDER — DEXMEDETOMIDINE (PRECEDEX) IN NS 20 MCG/5ML (4 MCG/ML) IV SYRINGE
PREFILLED_SYRINGE | INTRAVENOUS | Status: DC | PRN
  Administered 2020-10-10: 8 ug via INTRAVENOUS

## 2020-10-10 MED ORDER — DOCUSATE SODIUM 100 MG PO CAPS
100.0000 mg | ORAL_CAPSULE | Freq: Two times a day (BID) | ORAL | Status: DC
  Administered 2020-10-10: 100 mg via ORAL
  Filled 2020-10-10: qty 1

## 2020-10-10 MED ORDER — PROPOFOL 10 MG/ML IV BOLUS
INTRAVENOUS | Status: DC | PRN
  Administered 2020-10-10: 180 mg via INTRAVENOUS
  Administered 2020-10-10: 30 mg via INTRAVENOUS

## 2020-10-10 MED ORDER — ROCURONIUM BROMIDE 10 MG/ML (PF) SYRINGE
PREFILLED_SYRINGE | INTRAVENOUS | Status: AC
  Filled 2020-10-10: qty 10

## 2020-10-10 MED ORDER — CEFAZOLIN SODIUM-DEXTROSE 2-4 GM/100ML-% IV SOLN
2.0000 g | INTRAVENOUS | Status: AC
  Administered 2020-10-10: 2 g via INTRAVENOUS
  Filled 2020-10-10: qty 100

## 2020-10-10 MED ORDER — ORAL CARE MOUTH RINSE: 15.0000 mL | Freq: Once | OROMUCOSAL | Status: AC

## 2020-10-10 MED ORDER — SODIUM CHLORIDE 0.9% FLUSH: 3.0000 mL | INTRAVENOUS | Status: DC | PRN

## 2020-10-10 MED ORDER — PANTOPRAZOLE SODIUM 40 MG PO TBEC: 40.0000 mg | DELAYED_RELEASE_TABLET | Freq: Every day | ORAL | Status: DC

## 2020-10-10 MED ORDER — LIDOCAINE-EPINEPHRINE 1 %-1:100000 IJ SOLN
INTRAMUSCULAR | Status: DC | PRN
  Administered 2020-10-10: 5 mL

## 2020-10-10 MED ORDER — ROCURONIUM BROMIDE 10 MG/ML (PF) SYRINGE
PREFILLED_SYRINGE | INTRAVENOUS | Status: DC | PRN
  Administered 2020-10-10: 10 mg via INTRAVENOUS
  Administered 2020-10-10: 70 mg via INTRAVENOUS

## 2020-10-10 MED ORDER — MIDAZOLAM HCL 2 MG/2ML IJ SOLN
INTRAMUSCULAR | Status: DC | PRN
  Administered 2020-10-10: 2 mg via INTRAVENOUS

## 2020-10-10 MED ORDER — LIDOCAINE HCL (PF) 2 % IJ SOLN
INTRAMUSCULAR | Status: AC
  Filled 2020-10-10: qty 5

## 2020-10-10 MED ORDER — DEXAMETHASONE SODIUM PHOSPHATE 10 MG/ML IJ SOLN
INTRAMUSCULAR | Status: DC | PRN
  Administered 2020-10-10: 10 mg via INTRAVENOUS

## 2020-10-10 MED ORDER — HEMOSTATIC AGENTS (NO CHARGE) OPTIME
TOPICAL | Status: DC | PRN
  Administered 2020-10-10: 1 via TOPICAL

## 2020-10-10 MED ORDER — PHENYLEPHRINE 40 MCG/ML (10ML) SYRINGE FOR IV PUSH (FOR BLOOD PRESSURE SUPPORT)
PREFILLED_SYRINGE | INTRAVENOUS | Status: DC | PRN
  Administered 2020-10-10: 120 ug via INTRAVENOUS
  Administered 2020-10-10 (×3): 80 ug via INTRAVENOUS

## 2020-10-10 MED ORDER — FENTANYL CITRATE (PF) 100 MCG/2ML IJ SOLN
25.0000 ug | INTRAMUSCULAR | Status: DC | PRN
  Administered 2020-10-10 (×2): 25 ug via INTRAVENOUS
  Administered 2020-10-10: 50 ug via INTRAVENOUS

## 2020-10-10 MED ORDER — MIDAZOLAM HCL 2 MG/2ML IJ SOLN
INTRAMUSCULAR | Status: AC
  Filled 2020-10-10: qty 2

## 2020-10-10 MED ORDER — DIPHENHYDRAMINE HCL 25 MG PO CAPS: 25.0000 mg | ORAL_CAPSULE | Freq: Every evening | ORAL | Status: DC | PRN

## 2020-10-10 MED ORDER — OXYCODONE HCL 5 MG PO TABS: 5.0000 mg | ORAL_TABLET | Freq: Once | ORAL | Status: DC | PRN

## 2020-10-10 MED ORDER — ONDANSETRON HCL 4 MG PO TABS: 4.0000 mg | ORAL_TABLET | Freq: Four times a day (QID) | ORAL | Status: DC | PRN

## 2020-10-10 MED ORDER — CEFAZOLIN SODIUM-DEXTROSE 2-4 GM/100ML-% IV SOLN
2.0000 g | Freq: Three times a day (TID) | INTRAVENOUS | Status: AC
  Administered 2020-10-10 – 2020-10-11 (×2): 2 g via INTRAVENOUS
  Filled 2020-10-10 (×3): qty 100

## 2020-10-10 MED ORDER — BUPIVACAINE HCL (PF) 0.5 % IJ SOLN
INTRAMUSCULAR | Status: AC
  Filled 2020-10-10: qty 30

## 2020-10-10 MED ORDER — ACETAMINOPHEN 325 MG PO TABS: 650.0000 mg | ORAL_TABLET | ORAL | Status: DC | PRN

## 2020-10-10 MED ORDER — BUPIVACAINE HCL (PF) 0.5 % IJ SOLN
INTRAMUSCULAR | Status: DC | PRN
  Administered 2020-10-10: 5 mL

## 2020-10-10 MED ORDER — PHENOL 1.4 % MT LIQD: 1.0000 | OROMUCOSAL | Status: DC | PRN

## 2020-10-10 MED ORDER — FENTANYL CITRATE (PF) 250 MCG/5ML IJ SOLN
INTRAMUSCULAR | Status: DC | PRN
  Administered 2020-10-10: 100 ug via INTRAVENOUS
  Administered 2020-10-10 (×4): 50 ug via INTRAVENOUS

## 2020-10-10 MED ORDER — ONDANSETRON HCL 4 MG/2ML IJ SOLN
INTRAMUSCULAR | Status: DC | PRN
  Administered 2020-10-10: 4 mg via INTRAVENOUS

## 2020-10-10 MED ORDER — LIDOCAINE-EPINEPHRINE 1 %-1:100000 IJ SOLN
INTRAMUSCULAR | Status: AC
  Filled 2020-10-10: qty 1

## 2020-10-10 MED ORDER — THROMBIN 5000 UNITS EX SOLR
CUTANEOUS | Status: AC
  Filled 2020-10-10: qty 5000

## 2020-10-10 MED ORDER — FLEET ENEMA 7-19 GM/118ML RE ENEM: 1.0000 | ENEMA | Freq: Once | RECTAL | Status: DC | PRN

## 2020-10-10 MED ORDER — FENTANYL CITRATE (PF) 100 MCG/2ML IJ SOLN: 25.0000 ug | INTRAMUSCULAR | Status: DC | PRN

## 2020-10-10 MED ORDER — DEXAMETHASONE SODIUM PHOSPHATE 10 MG/ML IJ SOLN
INTRAMUSCULAR | Status: AC
  Filled 2020-10-10: qty 1

## 2020-10-10 MED ORDER — ACETAMINOPHEN 500 MG PO TABS
1000.0000 mg | ORAL_TABLET | Freq: Four times a day (QID) | ORAL | Status: DC
  Administered 2020-10-10 – 2020-10-11 (×3): 1000 mg via ORAL
  Filled 2020-10-10 (×3): qty 2

## 2020-10-10 MED ORDER — THROMBIN 5000 UNITS EX SOLR
OROMUCOSAL | Status: DC | PRN
  Administered 2020-10-10: 5 mL via TOPICAL

## 2020-10-10 MED ORDER — LIDOCAINE 2% (20 MG/ML) 5 ML SYRINGE
INTRAMUSCULAR | Status: DC | PRN
  Administered 2020-10-10: 50 mg via INTRAVENOUS

## 2020-10-10 MED ORDER — PHENYLEPHRINE HCL-NACL 10-0.9 MG/250ML-% IV SOLN
INTRAVENOUS | Status: AC
  Filled 2020-10-10: qty 250

## 2020-10-10 MED ORDER — PHENYLEPHRINE 40 MCG/ML (10ML) SYRINGE FOR IV PUSH (FOR BLOOD PRESSURE SUPPORT)
PREFILLED_SYRINGE | INTRAVENOUS | Status: AC
  Filled 2020-10-10: qty 20

## 2020-10-10 MED ORDER — ACETAMINOPHEN 500 MG PO TABS
1000.0000 mg | ORAL_TABLET | Freq: Once | ORAL | Status: AC
  Administered 2020-10-10: 1000 mg via ORAL
  Filled 2020-10-10: qty 2

## 2020-10-10 MED ORDER — ACETAMINOPHEN 325 MG PO TABS: 650.0000 mg | ORAL_TABLET | Freq: Every evening | ORAL | Status: DC | PRN

## 2020-10-10 MED ORDER — PHENYLEPHRINE HCL-NACL 10-0.9 MG/250ML-% IV SOLN
INTRAVENOUS | Status: DC | PRN
  Administered 2020-10-10: 20 ug/min via INTRAVENOUS

## 2020-10-10 MED ORDER — ONDANSETRON HCL 4 MG/2ML IJ SOLN
INTRAMUSCULAR | Status: AC
  Filled 2020-10-10: qty 2

## 2020-10-10 MED ORDER — SODIUM CHLORIDE 0.9 % IV SOLN: INTRAVENOUS | Status: DC

## 2020-10-10 MED ORDER — OXYCODONE HCL 5 MG/5ML PO SOLN: 5.0000 mg | Freq: Once | ORAL | Status: DC | PRN

## 2020-10-10 MED ORDER — PREGABALIN 75 MG PO CAPS: 150.0000 mg | ORAL_CAPSULE | Freq: Two times a day (BID) | ORAL | Status: DC

## 2020-10-10 MED ORDER — 0.9 % SODIUM CHLORIDE (POUR BTL) OPTIME
TOPICAL | Status: DC | PRN
  Administered 2020-10-10: 1000 mL

## 2020-10-10 SURGICAL SUPPLY — 79 items
ADH SKN CLS APL DERMABOND .7 (GAUZE/BANDAGES/DRESSINGS) ×1
APL SKNCLS STERI-STRIP NONHPOA (GAUZE/BANDAGES/DRESSINGS)
BASKET BONE COLLECTION (BASKET) ×2 IMPLANT
BENZOIN TINCTURE PRP APPL 2/3 (GAUZE/BANDAGES/DRESSINGS) IMPLANT
BLADE CLIPPER SURG (BLADE) IMPLANT
BLADE SURG 11 STRL SS (BLADE) ×2 IMPLANT
BUR MATCHSTICK NEURO 3.0 LAGG (BURR) ×2 IMPLANT
BUR PRECISION FLUTE 5.0 (BURR) ×2 IMPLANT
CAGE INTERBODY PL SHT 7X22.5 (Plate) ×2 IMPLANT
CANISTER SUCT 3000ML PPV (MISCELLANEOUS) ×2 IMPLANT
CARTRIDGE OIL MAESTRO DRILL (MISCELLANEOUS) ×1 IMPLANT
CNTNR URN SCR LID CUP LEK RST (MISCELLANEOUS) ×1 IMPLANT
CONT SPEC 4OZ STRL OR WHT (MISCELLANEOUS) ×2
COVER BACK TABLE 60X90IN (DRAPES) ×2 IMPLANT
COVER WAND RF STERILE (DRAPES) ×1 IMPLANT
DECANTER SPIKE VIAL GLASS SM (MISCELLANEOUS) ×3 IMPLANT
DERMABOND ADVANCED (GAUZE/BANDAGES/DRESSINGS) ×1
DERMABOND ADVANCED .7 DNX12 (GAUZE/BANDAGES/DRESSINGS) ×1 IMPLANT
DIFFUSER DRILL AIR PNEUMATIC (MISCELLANEOUS) ×2 IMPLANT
DRAPE C-ARM 42X72 X-RAY (DRAPES) ×2 IMPLANT
DRAPE C-ARMOR (DRAPES) ×2 IMPLANT
DRAPE LAPAROTOMY 100X72X124 (DRAPES) ×2 IMPLANT
DRAPE SURG 17X23 STRL (DRAPES) ×2 IMPLANT
DRSG OPSITE POSTOP 4X6 (GAUZE/BANDAGES/DRESSINGS) ×1 IMPLANT
DURAPREP 26ML APPLICATOR (WOUND CARE) ×2 IMPLANT
ELECT REM PT RETURN 9FT ADLT (ELECTROSURGICAL) ×2
ELECTRODE REM PT RTRN 9FT ADLT (ELECTROSURGICAL) ×1 IMPLANT
GAUZE 4X4 16PLY RFD (DISPOSABLE) IMPLANT
GAUZE SPONGE 4X4 12PLY STRL (GAUZE/BANDAGES/DRESSINGS) IMPLANT
GLOVE BIO SURGEON STRL SZ 6.5 (GLOVE) ×5 IMPLANT
GLOVE BIO SURGEON STRL SZ7.5 (GLOVE) ×4 IMPLANT
GLOVE BIOGEL PI IND STRL 6.5 (GLOVE) IMPLANT
GLOVE BIOGEL PI IND STRL 7.5 (GLOVE) ×2 IMPLANT
GLOVE BIOGEL PI INDICATOR 6.5 (GLOVE) ×1
GLOVE BIOGEL PI INDICATOR 7.5 (GLOVE) ×2
GLOVE ECLIPSE 7.0 STRL STRAW (GLOVE) ×5 IMPLANT
GLOVE EXAM NITRILE XL STR (GLOVE) IMPLANT
GLOVE SURG SS PI 6.0 STRL IVOR (GLOVE) ×2 IMPLANT
GLOVE SURG UNDER POLY LF SZ6.5 (GLOVE) ×2 IMPLANT
GOWN STRL REUS W/ TWL LRG LVL3 (GOWN DISPOSABLE) ×4 IMPLANT
GOWN STRL REUS W/ TWL XL LVL3 (GOWN DISPOSABLE) IMPLANT
GOWN STRL REUS W/TWL 2XL LVL3 (GOWN DISPOSABLE) IMPLANT
GOWN STRL REUS W/TWL LRG LVL3 (GOWN DISPOSABLE) ×14
GOWN STRL REUS W/TWL XL LVL3 (GOWN DISPOSABLE)
GRAFT BONE PROTEIOS XS 0.5CC (Orthopedic Implant) ×1 IMPLANT
HEMOSTAT POWDER KIT SURGIFOAM (HEMOSTASIS) ×2 IMPLANT
KIT BASIN OR (CUSTOM PROCEDURE TRAY) ×2 IMPLANT
KIT POSITION SURG JACKSON T1 (MISCELLANEOUS) ×2 IMPLANT
KIT TURNOVER KIT B (KITS) ×2 IMPLANT
MILL MEDIUM DISP (BLADE) ×2 IMPLANT
NDL HYPO 18GX1.5 BLUNT FILL (NEEDLE) IMPLANT
NDL SPNL 18GX3.5 QUINCKE PK (NEEDLE) IMPLANT
NEEDLE HYPO 18GX1.5 BLUNT FILL (NEEDLE) ×2 IMPLANT
NEEDLE HYPO 22GX1.5 SAFETY (NEEDLE) ×2 IMPLANT
NEEDLE SPNL 18GX3.5 QUINCKE PK (NEEDLE) ×2 IMPLANT
NS IRRIG 1000ML POUR BTL (IV SOLUTION) ×2 IMPLANT
OIL CARTRIDGE MAESTRO DRILL (MISCELLANEOUS) ×2
PACK LAMINECTOMY NEURO (CUSTOM PROCEDURE TRAY) ×2 IMPLANT
PAD ARMBOARD 7.5X6 YLW CONV (MISCELLANEOUS) ×6 IMPLANT
PATTIES SURGICAL .5 X.5 (GAUZE/BANDAGES/DRESSINGS) ×1 IMPLANT
PATTIES SURGICAL 1X1 (DISPOSABLE) ×1 IMPLANT
PUTTY DBF GRAFTON 3CC W/DELIVE (Putty) ×1 IMPLANT
ROD CC 30MM (Rod) ×2 IMPLANT
SCREW 5.5X35MM (Screw) ×4 IMPLANT
SCREW BN 35X5.5XMA NS SPNE (Screw) IMPLANT
SCREW SET SOLERA (Screw) ×8 IMPLANT
SCREW SET SOLERA TI (Screw) IMPLANT
SCREW SOLERA 6.5X35 (Screw) ×2 IMPLANT
SPONGE LAP 4X18 RFD (DISPOSABLE) IMPLANT
SPONGE SURGIFOAM ABS GEL 100 (HEMOSTASIS) IMPLANT
STRIP CLOSURE SKIN 1/2X4 (GAUZE/BANDAGES/DRESSINGS) IMPLANT
SUT VIC AB 0 CT1 18XCR BRD8 (SUTURE) ×1 IMPLANT
SUT VIC AB 0 CT1 8-18 (SUTURE) ×4
SUT VICRYL 3-0 RB1 18 ABS (SUTURE) ×3 IMPLANT
SYR 3ML LL SCALE MARK (SYRINGE) ×4 IMPLANT
TOWEL GREEN STERILE (TOWEL DISPOSABLE) ×2 IMPLANT
TOWEL GREEN STERILE FF (TOWEL DISPOSABLE) ×2 IMPLANT
TRAY FOLEY MTR SLVR 16FR STAT (SET/KITS/TRAYS/PACK) ×2 IMPLANT
WATER STERILE IRR 1000ML POUR (IV SOLUTION) ×2 IMPLANT

## 2020-10-10 NOTE — Transfer of Care (Signed)
Immediate Anesthesia Transfer of Care Note  Patient: Gerald [name]  Procedure(s) Performed: Comer Locket LUMBAR INTERBODY FUSION LUMBAR FIVE-SACRAL ONE, POSTERIOR NON-SEGMENTAL INSTRUMENTATION. (N/A Spine Lumbar)  Patient Location: PACU  Anesthesia Type:General  Level of Consciousness: drowsy and responds to stimulation  Airway & Oxygen Therapy: Patient Spontanous Breathing and Patient connected to face mask oxygen  Post-op Assessment: Report given to RN and Post -op Vital signs reviewed and stable  Post vital signs: Reviewed and stable  Last Vitals:  Vitals Value Taken Time  BP 108/74 10/10/20 1513  Temp    Pulse 82 10/10/20 1513  Resp 14 10/10/20 1513  SpO2 98 % 10/10/20 1513  Vitals shown include unvalidated device data.  Last Pain:  Vitals:   10/10/20 1111  TempSrc:   PainSc: 4          Complications: No complications documented.

## 2020-10-10 NOTE — Progress Notes (Signed)
Orthopedic Tech Progress Note Patient Details:  [name] Jul 07, 1956 [id number] Fitted patient with LSO. Left at bedside Patient ID: Gerald [name], male   DOB: December 17, 1955, 64 y.o.   MRN: [id number]   Janit Pagan 10/10/2020, 5:36 PM

## 2020-10-10 NOTE — Anesthesia Preprocedure Evaluation (Signed)
Anesthesia Evaluation  Patient identified by MRN, date of birth, ID band Patient awake    Reviewed: Allergy & Precautions, H&P , NPO status , Patient's Chart, lab work & pertinent test results  History of Anesthesia Complications (+) PONV and history of anesthetic complications  Airway Mallampati: II   Neck ROM: full    Dental   Pulmonary former smoker,    breath sounds clear to auscultation       Cardiovascular negative cardio ROS   Rhythm:regular Rate:Normal     Neuro/Psych  Neuromuscular disease    GI/Hepatic PUD, GERD  ,  Endo/Other    Renal/GU      Musculoskeletal   Abdominal   Peds  Hematology   Anesthesia Other Findings   Reproductive/Obstetrics                             Anesthesia Physical Anesthesia Plan  ASA: II  Anesthesia Plan: General   Post-op Pain Management:    Induction: Intravenous  PONV Risk Score and Plan: 3 and Ondansetron, Dexamethasone, Midazolam and Treatment may vary due to age or medical condition  Airway Management Planned: Oral ETT  Additional Equipment:   Intra-op Plan:   Post-operative Plan: Extubation in OR  Informed Consent: I have reviewed the patients History and Physical, chart, labs and discussed the procedure including the risks, benefits and alternatives for the proposed anesthesia with the patient or authorized representative who has indicated his/her understanding and acceptance.       Plan Discussed with: CRNA, Anesthesiologist and Surgeon  Anesthesia Plan Comments:         Anesthesia Quick Evaluation

## 2020-10-10 NOTE — Anesthesia Procedure Notes (Addendum)
Procedure Name: Intubation Date/Time: 10/10/2020 11:40 AM Performed by: Rande Brunt, CRNA Pre-anesthesia Checklist: Patient identified, Emergency Drugs available, Suction available and Patient being monitored Patient Re-evaluated:Patient Re-evaluated prior to induction Oxygen Delivery Method: Circle system utilized Preoxygenation: Pre-oxygenation with 100% oxygen Induction Type: IV induction Ventilation: Mask ventilation without difficulty Laryngoscope Size: Mac and 4 Grade View: Grade I Tube type: Oral Tube size: 7.5 mm Number of attempts: 1 Airway Equipment and Method: Stylet and Oral airway Placement Confirmation: ETT inserted through vocal cords under direct vision,  positive ETCO2 and breath sounds checked- equal and bilateral Secured at: 22 cm Tube secured with: Tape Dental Injury: Teeth and Oropharynx as per pre-operative assessment

## 2020-10-10 NOTE — Op Note (Signed)
NEUROSURGERY OPERATIVE NOTE   PREOP DIAGNOSIS:  1. L5 Spondylolysis 2. Lumbar stenosis, L5-S1 with neurogenic claudication  POSTOP DIAGNOSIS: Same  PROCEDURE: 1. L5 Gill procedure including L5 laminectomy with complete facetectomy for decompression of exiting nerve roots, more than would be required for placement of interbody graft 2. Placement of anterior interbody device - Medtronic expandable 22mm cage x2 3. Posterior non-segmental instrumentation using cortical pedicle screws at L5 - S1: Medtronic Solera 5.5x35 @ L5, 6.5x35 @ S1 4. Interbody arthrodesis, L5-S1 5. Use of locally harvested bone autograft 6. Use of non-structural bone allograft - ProteiOS  SURGEON: Dr. Consuella Lose, MD  ASSISTANT: Ferne Reus, PA-C  ANESTHESIA: General Endotracheal  EBL: 75cc  SPECIMENS: None  DRAINS: None  COMPLICATIONS: None immediate  CONDITION: Hemodynamically stable to PACU  HISTORY: Gerald [name] is a 64 y.o. male who has been followed in the outpatient clinic with back and Right-sided leg pain related to lytic spondylolisthesis with stenosis at L5-S1. Multiple conservative treatments were attempted without sustained improvement and we ultimately elected to proceed with surgical decompression and fusion. Risks, benefits, and alternative treatments were reviewed in detail in the office. After all questions were answered, informed consent was obtained and witnessed.  PROCEDURE IN DETAIL: The patient was brought to the operating room via stretcher. After induction of general anesthesia, the patient was positioned on the operative table in the prone position. All pressure points were meticulously padded. Incision was then marked out and prepped and draped in the usual sterile fashion.  After timeout was conducted, skin was infiltrated with local anesthetic.  Spinal needle was introduced to identify the surface projection of the L5 lamina and L5-S1 interspace.  Skin incision was  then made sharply and Bovie electrocautery was used to dissect the subcutaneous tissue until the lumbodorsal fascia was identified and incised. The muscle was then elevated in the subperiosteal plane and the L5 lamina and L5-S1 facet complexes were identified.  Dissection was also carried laterally to identify the defect in the pars interarticularis at L5.  Self-retaining retractors were then placed. Lateral fluoroscopy was taken with a dissector in the L4-5 and L5-S1 interspace to confirm our location.  At this point attention was turned to decompression. Complete  L5 laminectomy was completed with a high-speed drill and Kerrison punches.  Normal dura was identified.    The remainder of the inferior articulating process and pars was then noted to be quite mobile.  The remainder of the inferior articulating process was therefore removed using Kerrison punches and the high-speed drill.  There was a significant amount of spondylitic tissue within primarily the right L5-S1 foramen immediately underlying the defect in the pars.  This was carefully removed with Kerrison punches underneath the L5 pedicle.  This also required removal of the medial and superior portion of the S1 superior articulating process.  Once this was done, I was able to identify the exiting right L5 nerve root for a length of approximately 1-1/2 to 2 cm.  Once this was completed I was able to easily pass a ball-tipped dissector along the nerve root and out the foramen indicating good decompression.  Similar procedure was carried out on the contralateral side, with somewhat less spondylitic tissue noted in the foramen.  Again, the left-sided L5 nerve root was identified.  Ball-tipped dissector was passed out the foramen easily.  Again, this did require removal of the superior and medial portion of the superior articulating process of S1 on the left.  In addition, the  traversing S1 nerve roots were identified bilaterally.  These did not appear to be  significantly compressed.    Disc space was then identified, incised bilaterally, and initially entered using an osteotome.  Using a combination of shavers, curettes and rongeurs, complete discectomy was completed. Endplates were cleaned of any cartilaginous endplate using rasps. Bone harvested during decompression was mixed with ProteiOS and packed into the interspace. 104mm expandable cages were tapped into place bilaterally. cages were expanded to approximately 47mm.  Good position was confirmed with fluoroscopy.  At this point, the entry points for bilateral L5 and S1 cortical pedicle screws were identified using standard anatomic landmarks and lateral fluoro. Pilot holes were then drilled and tapped to 5.5 x 33mm at L5 and 6.5 x 35mm at S1. Screws were then placed in L5 and S1. Prebent lordotic rod was then sized and placed into the pedicle screws. Set screws were placed and final tightened. Final AP and lateral fluoroscopic images confirmed good position.  Hemostasis was secured and confirmed with bipolar cautery and morcellized gelfoam with thrombin. The wound was then irrigated with copious amounts of antibiotic saline, then closed in standard fashion using a combination of interrupted 0 and 3-0 Vicryl stitches in the muscular, fascial, and subcutaneous layers. Skin was then closed using standard Dermabond. Sterile dressing was then applied. The patient was then transferred to the stretcher, extubated, and taken to the postanesthesia care unit in stable hemodynamic condition.  At the end of the case all sponge, needle, cottonoid, and instrument counts were correct.

## 2020-10-11 LAB — BASIC METABOLIC PANEL
Anion gap: 11 (ref 5–15)
BUN: 14 mg/dL (ref 8–23)
CO2: 24 mmol/L (ref 22–32)
Calcium: 9.1 mg/dL (ref 8.9–10.3)
Chloride: 102 mmol/L (ref 98–111)
Creatinine, Ser: 0.93 mg/dL (ref 0.61–1.24)
GFR, Estimated: >60 mL/min (ref >60)
Glucose, Bld: 109 mg/dL — ABNORMAL HIGH (ref 70–99)
Potassium: 4.2 mmol/L (ref 3.5–5.1)
Sodium: 137 mmol/L (ref 135–145)

## 2020-10-11 LAB — CBC
HCT: 33.6 % — ABNORMAL LOW (ref 39.0–52.0)
Hemoglobin: 11.2 g/dL — ABNORMAL LOW (ref 13.0–17.0)
MCH: 32.7 pg (ref 26.0–34.0)
MCHC: 33.3 g/dL (ref 30.0–36.0)
MCV: 98.2 fL (ref 80.0–100.0)
Platelets: 334 10*3/uL (ref 150–400)
RBC: 3.42 MIL/uL — ABNORMAL LOW (ref 4.22–5.81)
RDW: 13.8 % (ref 11.5–15.5)
WBC: 12.1 10*3/uL — ABNORMAL HIGH (ref 4.0–10.5)
nRBC: 0 % (ref 0.0–0.2)

## 2020-10-11 LAB — APTT: aPTT: 24 seconds (ref 24–36)

## 2020-10-11 LAB — PROTIME-INR
INR: 1 (ref 0.8–1.2)
Prothrombin Time: 12.5 seconds (ref 11.4–15.2)

## 2020-10-11 MED ORDER — METHOCARBAMOL 750 MG PO TABS: 750.0000 mg | ORAL_TABLET | Freq: Three times a day (TID) | ORAL | 1 refills | Status: DC | PRN

## 2020-10-11 MED ORDER — OXYCODONE-ACETAMINOPHEN 7.5-325 MG PO TABS
1.0000 | ORAL_TABLET | ORAL | 0 refills | Status: DC | PRN
Start: 2020-10-11 — End: 2021-11-07

## 2020-10-11 NOTE — Progress Notes (Signed)
  NEUROSURGERY PROGRESS NOTE   No issues overnight.  Complains of appropriate back soreness Pre op RLE pain resolved ambulating well. Tolerating po. Voiding normal  EXAM:  BP 113/65 (BP Location: Left Arm)   Pulse 100   Temp 98.6 F (37 C) (Oral)   Resp 17   Ht 6' (1.829 m)   Wt 83 kg   SpO2 96%   BMI 24.82 kg/m   Awake, alert, oriented  Speech fluent, appropriate  CN grossly intact  5/5 BUE/BLE  Incision: c/d/i  IMPRESSION/PLAN 64 y.o. male POD1 L5-S1 fusion. Doing well - D/C home

## 2020-10-11 NOTE — Anesthesia Postprocedure Evaluation (Signed)
Anesthesia Post Note  Patient: Gerald [name]  Procedure(s) Performed: Comer Locket LUMBAR INTERBODY FUSION LUMBAR FIVE-SACRAL ONE, POSTERIOR NON-SEGMENTAL INSTRUMENTATION. (N/A Spine Lumbar)     Patient location during evaluation: PACU Anesthesia Type: General Level of consciousness: awake and alert Pain management: pain level controlled Vital Signs Assessment: post-procedure vital signs reviewed and stable Respiratory status: spontaneous breathing, nonlabored ventilation, respiratory function stable and patient connected to nasal cannula oxygen Cardiovascular status: blood pressure returned to baseline and stable Postop Assessment: no apparent nausea or vomiting Anesthetic complications: no   No complications documented.  Last Vitals:  Vitals:   10/11/20 0357 10/11/20 0729  BP: 115/70 113/65  Pulse: 86 100  Resp: 20 17  Temp: 36.8 C 37 C  SpO2: 95% 96%    Last Pain:  Vitals:   10/11/20 0729  TempSrc: Oral  PainSc:                  Montey Ebel L Addis Bennie

## 2020-10-11 NOTE — Evaluation (Signed)
Physical Therapy Evaluation and Discharge Patient Details Name: Gerald [name] MRN: [id number] DOB: 1956/07/08 Today's Date: 10/11/2020   History of Present Illness  64 y.o. 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 PMHx significant for Pars defect with spondylolisthesis, HLD,  and former tobacco use.     Clinical Impression  Patient evaluated by Physical Therapy with no further acute PT needs identified. All education has been completed and the patient has no further questions. Pt was able to demonstrate transfers and ambulation with gross modified independence to supervision for safety and no AD. Pt mildly unsteady throughout OOB mobility however no overt LOB noted. Pt was educated on precautions, brace application/wearing schedule, appropriate activity progression, and car transfer. See below for any follow-up Physical Therapy or equipment needs. PT is signing off. Thank you for this referral.     Follow Up Recommendations No PT follow up;Supervision for mobility/OOB    Equipment Recommendations  None recommended by PT    Recommendations for Other Services       Precautions / Restrictions Precautions Precautions: Fall;Back Precaution Booklet Issued: Yes (comment) Precaution Comments: Reviewed precautions occasionally Required Braces or Orthoses: Spinal Brace Spinal Brace: Lumbar corset;Applied in sitting position Restrictions Weight Bearing Restrictions: No      Mobility  Bed Mobility Overal bed mobility: Modified Independent             General bed mobility comments: HOB flat; min use of rails.     Transfers Overall transfer level: Modified independent Equipment used: None Transfers: Sit to/from Stand Sit to Stand: Modified independent (Device/Increase time) Stand pivot transfers: Min guard       General transfer comment: Slow and guarded due to  pain and was able to power-up to full stand without assist.   Ambulation/Gait Ambulation/Gait assistance: Supervision Gait Distance (Feet): 300 Feet Assistive device: None Gait Pattern/deviations: Step-through pattern;Decreased stride length;Drifts right/left Gait velocity: Decreased Gait velocity interpretation: 1.31 - 2.62 ft/sec, indicative of limited community ambulator General Gait Details: Mildly unsteady throughout gait training with noted drifting L and R on occasion.   Stairs Stairs: Yes Stairs assistance: Supervision Stair Management: One rail Right;Alternating pattern;Step to pattern;Forwards Number of Stairs: 10 General stair comments: VC's for sequencing and general safety. Pt was painful with leading up on the RLE however improved with leading up on the LLE.   Wheelchair Mobility    Modified Rankin (Stroke Patients Only)       Balance Overall balance assessment: Mild deficits observed, not formally tested                                           Pertinent Vitals/Pain Pain Assessment: Faces Pain Score: 3  Faces Pain Scale: Hurts even more Pain Location: Low back with sit>stand. Once standing faces pain scale 2/10 Pain Descriptors / Indicators: Grimacing;Operative site guarding Pain Intervention(s): Limited activity within patient's tolerance;Monitored during session;Repositioned    Home Living Family/patient expects to be discharged to:: Private residence Living Arrangements: Spouse/significant other Available Help at Discharge: Family;Available 24 hours/day Type of Home: House Home Access: Stairs to enter Entrance Stairs-Rails: None Entrance Stairs-Number of Steps: 3 Home Layout: One level Home Equipment: Crutches;Other (comment) (Shower stool)      Prior Function Level of Independence: Independent         Comments: Independent with ADLs/IADLs  and functional mobility without AD. Patient was still driving, golfing up until  surgery.      Hand Dominance        Extremity/Trunk Assessment   Upper Extremity Assessment Upper Extremity Assessment: Defer to OT evaluation    Lower Extremity Assessment Lower Extremity Assessment: RLE deficits/detail RLE Deficits / Details: Decreased strength and muscular endurance consistent with pre-op diagnosis.  RLE Sensation:  (Numbness R foot)    Cervical / Trunk Assessment Cervical / Trunk Assessment: Other exceptions Cervical / Trunk Exceptions: s/p spinal sx.   Communication   Communication: No difficulties  Cognition Arousal/Alertness: Awake/alert Behavior During Therapy: WFL for tasks assessed/performed Overall Cognitive Status: Within Functional Limits for tasks assessed                                        General Comments General comments (skin integrity, edema, etc.): Clean, dry dressing to low back.     Exercises     Assessment/Plan    PT Assessment Patent does not need any further PT services  PT Problem List         PT Treatment Interventions      PT Goals (Current goals can be found in the Care Plan section)  Acute Rehab PT Goals Patient Stated Goal: To decrease pain; get back to golfing PT Goal Formulation: All assessment and education complete, DC therapy    Frequency     Barriers to discharge        Co-evaluation               AM-PAC PT "6 Clicks" Mobility  Outcome Measure Help needed turning from your back to your side while in a flat bed without using bedrails?: None Help needed moving from lying on your back to sitting on the side of a flat bed without using bedrails?: None Help needed moving to and from a bed to a chair (including a wheelchair)?: None Help needed standing up from a chair using your arms (e.g., wheelchair or bedside chair)?: None Help needed to walk in hospital room?: A Little Help needed climbing 3-5 steps with a railing? : A Little 6 Click Score: 22    End of Session Equipment  Utilized During Treatment: Gait belt;Back brace Activity Tolerance: Patient tolerated treatment well Patient left: with family/visitor present;Other (comment) (In bathroom) Nurse Communication: Mobility status PT Visit Diagnosis: Unsteadiness on feet (R26.81);Pain Pain - part of body:  (back)    Time: 6045-4098 PT Time Calculation (min) (ACUTE ONLY): 18 min   Charges:   PT Evaluation $PT Eval Low Complexity: 1 Low          Rolinda Roan, PT, DPT Acute Rehabilitation Services Pager: [phone] Office: [phone]   Thelma Comp 10/11/2020, 10:20 AM

## 2020-10-11 NOTE — Plan of Care (Signed)
Patient alert and oriented, mae's well, voiding adequate amount of urine, swallowing without difficulty, no c/o pain at time of discharge. Patient discharged home with family. Script and discharged instructions given to patient. Patient and family stated understanding of instructions given. Patient has an appointment with Dr. Nundkumar  

## 2020-10-11 NOTE — Evaluation (Signed)
Occupational Therapy Evaluation Patient Details Name: Gerald [name] MRN: [id number] DOB: [birth year] Today's Date: 10/11/2020    History of Present Illness 64 y.o. 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 PMHx significant for Pars defect with spondylolisthesis, HLD,  and former tobacco use.    Clinical Impression   PTA patient was living in a private residence with his wife and was independent with all ADLs/IADLs without AD. Patient currently presents near baseline level of function demonstrating need for no more than Min guard for ADL transfers and short-distance mobility in room during functional activity 2/2 pain and occasional buckling of LE. Patient declined use of AD. Education on back precautions and compensatory techniques to increase adherence, home set-up for safety, and activity pacing to maximize safety and independence. Patient also able to don/doff back brace independently. Patient does not require need for continued acute occupational therapy services with OT to sign off at this time.     Follow Up Recommendations  No OT follow up;Supervision/Assistance - 24 hour    Equipment Recommendations  None recommended by OT    Recommendations for Other Services       Precautions / Restrictions Precautions Precautions: Fall;Back Precaution Booklet Issued: Yes (comment) Precaution Comments: Patient able to recall 3/3 back precautions. Caution: LLE buckling in standing on occasion.  Required Braces or Orthoses: Spinal Brace Spinal Brace: Lumbar corset;Applied in sitting position Restrictions Weight Bearing Restrictions: No      Mobility Bed Mobility Overal bed mobility: Modified Independent             General bed mobility comments: HOB flat, use of bedrail and good adherence to log rolling technique after education.     Transfers Overall transfer  level: Needs assistance Equipment used: None Transfers: Sit to/from [facility] Sit to Stand: Min guard Stand pivot transfers: Min guard       General transfer comment: Min guard for steadying and cues for upright posture 2/2 guarding.     Balance Overall balance assessment: Mild deficits observed, not formally tested                                         ADL either performed or assessed with clinical judgement   ADL Overall ADL's : Needs assistance/impaired Eating/Feeding: Independent   Grooming: Min guard;Standing Grooming Details (indicate cue type and reason): Min guard for occasional steadying. LLE gives out.          Upper Body Dressing : Modified independent;Sitting   Lower Body Dressing: Min guard;Sit to/from Health and safety inspector Details (indicate cue type and reason): Did not assess for safety. Commode very low. No BSC in room. Simulated with sit <> stand from EOB with Min guard for steadying.          Functional mobility during ADLs: Min guard General ADL Comments: Min guard for short-distance functional mobility in room. LLE buckling with patient noting some weakness.      Vision   Vision Assessment?: No apparent visual deficits     Perception     Praxis      Pertinent Vitals/Pain Pain Assessment: 0-10 Pain Score: 3  Pain Location: Low back  Pain Descriptors / Indicators: Constant;Grimacing;Guarding Pain Intervention(s): Limited activity within patient's tolerance;Monitored during session;Premedicated before session;Repositioned     Hand Dominance  Extremity/Trunk Assessment Upper Extremity Assessment Upper Extremity Assessment: Overall WFL for tasks assessed   Lower Extremity Assessment Lower Extremity Assessment: Defer to PT evaluation   Cervical / Trunk Assessment Cervical / Trunk Assessment: Other exceptions Cervical / Trunk Exceptions: s/p spinal sx.    Communication  Communication Communication: No difficulties   Cognition Arousal/Alertness: Awake/alert Behavior During Therapy: WFL for tasks assessed/performed Overall Cognitive Status: Within Functional Limits for tasks assessed                                     General Comments  Clean, dry dressing to low back.     Exercises     Shoulder Instructions      Home Living Family/patient expects to be discharged to:: Private residence Living Arrangements: Spouse/significant other Available Help at Discharge: Family;Available 24 hours/day Type of Home: House Home Access: Stairs to enter [employer] of Steps: 3 Entrance Stairs-Rails: None Home Layout: One level     Bathroom Shower/Tub: Occupational psychologist: Standard     Home Equipment: Crutches;Other (comment) (Shower stool)          Prior Functioning/Environment Level of Independence: Independent        Comments: Independent with ADLs/IADLs and functional mobility without AD. Patient was still driving.         OT Problem List: Pain;Impaired balance (sitting and/or standing)      OT Treatment/Interventions:      OT Goals(Current goals can be found in the care plan section) Acute Rehab OT Goals Patient Stated Goal: To decrease pain.  OT Goal Formulation: With patient  OT Frequency:     Barriers to D/C:            Co-evaluation              AM-PAC OT "6 Clicks" Daily Activity     Outcome Measure Help from another person eating meals?: None Help from another person taking care of personal grooming?: None Help from another person toileting, which includes using toliet, bedpan, or urinal?: None Help from another person bathing (including washing, rinsing, drying)?: A Little Help from another person to put on and taking off regular upper body clothing?: None Help from another person to put on and taking off regular lower body clothing?: A Little 6 Click Score: 22   End of  Session Equipment Utilized During Treatment: Gait belt;Back brace  Activity Tolerance: Patient tolerated treatment well Patient left: in bed;with call bell/phone within reach;with family/visitor present  OT Visit Diagnosis: Unsteadiness on feet (R26.81);Muscle weakness (generalized) (M62.81);Pain Pain - part of body:  (Back)                Time: 4765-4650 OT Time Calculation (min): 23 min Charges:  OT General Charges $OT Visit: 1 Visit OT Evaluation $OT Eval Low Complexity: 1 Low OT Treatments $Self Care/Home Management : 8-22 mins  Dawnyel Leven H. OTR/L Supplemental OT, Department of rehab services [phone]  Mclain Freer R H. 10/11/2020, 8:46 AM

## 2020-10-11 NOTE — Discharge Summary (Signed)
Physician Discharge Summary  Patient ID: Gerald [name] MRN: [id number] DOB/AGE: Aug 25, 1956 64 y.o.  Admit date: 10/10/2020 Discharge date: 10/11/2020  Admission Diagnoses:  Spondylolisthesis of lumbar   Discharge Diagnoses:  Same Active Problems:   Spondylolisthesis of lumbar region   Discharged Condition: Stable  Hospital Course:  Gerald [name] is a 64 y.o. male who was admitted for the below procedure. There were no post operative complications. At time of discharge, pain was well controlled, ambulating with Pt/OT, tolerating po, voiding normal. Ready for discharge.  Treatments: Surgery - L5-S1 fusion  Discharge Exam: Blood pressure 113/65, pulse 100, temperature 98.6 F (37 C), temperature source Oral, resp. rate 17, height 6' (1.829 m), weight 83 kg, SpO2 96 %. Awake, alert, oriented Speech fluent, appropriate CN grossly intact 5/5 BUE/BLE Wound c/d/i  Disposition: Discharge disposition: 01-Home or Self Care       Discharge Instructions    Call MD for:  difficulty breathing, headache or visual disturbances   Complete by: As directed    Call MD for:  persistant dizziness or light-headedness   Complete by: As directed    Call MD for:  redness, tenderness, or signs of infection (pain, swelling, redness, odor or green/yellow discharge around incision site)   Complete by: As directed    Call MD for:  severe uncontrolled pain   Complete by: As directed    Call MD for:  temperature >100.4   Complete by: As directed    Diet - low sodium heart healthy   Complete by: As directed    Driving Restrictions   Complete by: As directed    Do not drive until given clearance.   Increase activity slowly   Complete by: As directed    Lifting restrictions   Complete by: As directed    Do not lift anything >10lbs. Avoid bending and twisting in awkward positions. Avoid bending at the back.   May shower / Bathe   Complete by: As directed    In 24 hours. Okay to wash  wound with warm soapy water. Avoid scrubbing the wound. Pat dry.   Remove dressing in 48 hours   Complete by: As directed      Allergies as of 10/11/2020   No Known Allergies     Medication List    STOP taking these medications   oxyCODONE-acetaminophen 5-325 MG tablet Commonly known as: PERCOCET/ROXICET Replaced by: oxyCODONE-acetaminophen 7.5-325 MG tablet     TAKE these medications   diphenhydramine-acetaminophen 25-500 MG Tabs tablet Commonly known as: TYLENOL PM Take 1 tablet by mouth at bedtime as needed.   esomeprazole 40 MG capsule Commonly known as: NEXIUM TAKE ONE CAPSULE BY MOUTH DAILY AT NOON   gabapentin 100 MG capsule Commonly known as: NEURONTIN Take 200 mg by mouth 2 (two) times daily.   ibuprofen 200 MG tablet Commonly known as: ADVIL Take 400 mg by mouth every 8 (eight) hours as needed for headache.   methocarbamol 750 MG tablet Commonly known as: Robaxin-750 Take 1 tablet (750 mg total) by mouth 3 (three) times daily as needed for muscle spasms.   oxyCODONE-acetaminophen 7.5-325 MG tablet Commonly known as: Percocet Take 1 tablet by mouth every 4 (four) hours as needed for severe pain. Replaces: oxyCODONE-acetaminophen 5-325 MG tablet   oxymetazoline 0.05 % nasal spray Commonly known as: AFRIN Place 1 spray into both nostrils 2 (two) times daily as needed for congestion.   pregabalin 150 MG capsule Commonly known as: Lyrica Take 1 capsule (150  mg total) by mouth 2 (two) times daily as needed. What changed: when to take this   tadalafil 20 MG tablet Commonly known as: CIALIS TAKE ONE TABLET BY MOUTH DAILY AS NEEDED FOR ERECTILE DYSFUNCTION What changed: See the new instructions.       Follow-up Information    Gerald Lose, MD. Schedule an appointment as soon as possible for a visit in 3 week(s).   Specialty: Neurosurgery Contact information: [address] [place] [address] [phone]                Signed: Traci Sermon 10/11/2020, 8:16 AM

## 2020-11-05 ENCOUNTER — Other Ambulatory Visit: Payer: Self-pay | Admitting: Family Medicine

## 2021-05-01 ENCOUNTER — Other Ambulatory Visit: Payer: Self-pay | Admitting: Family Medicine

## 2021-07-23 ENCOUNTER — Other Ambulatory Visit: Payer: Self-pay

## 2021-07-23 ENCOUNTER — Encounter: Payer: Self-pay | Admitting: Family Medicine

## 2021-07-23 ENCOUNTER — Ambulatory Visit (INDEPENDENT_AMBULATORY_CARE_PROVIDER_SITE_OTHER): Payer: Medicare HMO | Admitting: Family Medicine

## 2021-07-23 VITALS — BP 120/82 | HR 84 | Temp 97.0°F | Ht 72.0 in | Wt 177.4 lb

## 2021-07-23 DIAGNOSIS — Z125 Encounter for screening for malignant neoplasm of prostate: Secondary | ICD-10-CM | POA: Diagnosis not present

## 2021-07-23 DIAGNOSIS — Z136 Encounter for screening for cardiovascular disorders: Secondary | ICD-10-CM | POA: Diagnosis not present

## 2021-07-23 DIAGNOSIS — Z0000 Encounter for general adult medical examination without abnormal findings: Secondary | ICD-10-CM

## 2021-07-23 DIAGNOSIS — Z86010 Personal history of colonic polyps: Secondary | ICD-10-CM

## 2021-07-23 DIAGNOSIS — Z13220 Encounter for screening for lipoid disorders: Secondary | ICD-10-CM

## 2021-07-23 DIAGNOSIS — H259 Unspecified age-related cataract: Secondary | ICD-10-CM | POA: Diagnosis not present

## 2021-07-23 DIAGNOSIS — K219 Gastro-esophageal reflux disease without esophagitis: Secondary | ICD-10-CM | POA: Diagnosis not present

## 2021-07-23 DIAGNOSIS — N529 Male erectile dysfunction, unspecified: Secondary | ICD-10-CM | POA: Diagnosis not present

## 2021-07-23 DIAGNOSIS — M48061 Spinal stenosis, lumbar region without neurogenic claudication: Secondary | ICD-10-CM

## 2021-07-23 DIAGNOSIS — M4316 Spondylolisthesis, lumbar region: Secondary | ICD-10-CM

## 2021-07-23 DIAGNOSIS — Z9889 Other specified postprocedural states: Secondary | ICD-10-CM

## 2021-07-23 DIAGNOSIS — E7439 Other disorders of intestinal carbohydrate absorption: Secondary | ICD-10-CM | POA: Diagnosis not present

## 2021-07-23 DIAGNOSIS — Z87891 Personal history of nicotine dependence: Secondary | ICD-10-CM

## 2021-07-23 DIAGNOSIS — Z23 Encounter for immunization: Secondary | ICD-10-CM

## 2021-07-23 NOTE — Progress Notes (Signed)
Gerald [name] is a 65 y.o. male who presents for his welcome to Medicare visit and follow-up on chronic medical conditions.  He recently had a friend die of prostate cancer and would like to get a PSA done.  His immunizations were reviewed.  He does not want to get any more COVID vaccines, Pneumovax.  He is also not interested in starting on a statin drug.  He does have reflux disease and presently does use Nexium on a relatively regular basis.  Does have a history of colonic polyps and is scheduled for follow-up on that in 2026.  He does see ophthalmology regularly and does have a cataract on the left and is being followed for that.  He had back surgery in December and is now playing golf and doing quite well with that.  He does complain of some numbness to the medial aspect of the right foot.  Past medical history for smoking.  His alcohol consumption is minimal.  He also has a past history of glucose intolerance.  Presently the only medication he is taking regularly is Nexium.  He was given Cialis in the past but states that he does not need this. Immunizations and Health Maintenance Immunization History  Administered Date(s) Administered   Influenza, Quadrivalent, Recombinant, Inj, Pf 08/05/2019   Influenza,inj,Quad PF,6+ Mos 09/11/2018, 07/17/2020   Influenza-Unspecified 09/11/2018   PFIZER(Purple Top)SARS-COV-2 Vaccination 01/15/2020, 02/07/2020, 10/26/2020   PPD Test 03/06/2002   Tdap 05/25/2009, 06/21/2019   Zoster Recombinat (Shingrix) 07/08/2017, 09/23/2017   Zoster, Live 07/09/2016   Health Maintenance Due  Topic Date Due   COVID-19 Vaccine (4 - Booster for [place] series) 02/24/2021   INFLUENZA VACCINE  06/04/2021    Last colonoscopy:2021 Last OD:[id number] Dentist:? Ophtho:[place] Exercise:Regular  Other doctors caring for patient include:[place] Fredric Mare  Advanced Directives: Does Patient Have a Medical Advance Directive?: No Would patient like information on  creating a medical advance directive?: Yes (ED - Information included in AVS) Information given Depression screen:  See questionnaire below.     Depression screen [facility] 2/9 07/23/2021 07/17/2020 06/21/2019 07/02/2017 10/14/2013  Decreased Interest 0 0 0 0 0  Down, Depressed, Hopeless 0 0 0 0 0  PHQ - 2 Score 0 0 0 0 0    Fall Screen: See Questionaire below.   Fall Risk  07/23/2021 07/17/2020 07/02/2017 10/14/2013  Falls in the past year? 0 0 No No  Number falls in past yr: 0 - - -  Injury with Fall? 0 - - -  Risk for fall due to : - No Fall Risks - -  Follow up Falls evaluation completed - - -    ADL screen:  See questionnaire below.  Functional Status Survey: Is the patient deaf or have difficulty hearing?: No Does the patient have difficulty seeing, even when wearing glasses/contacts?: No Does the patient have difficulty concentrating, remembering, or making decisions?: No Does the patient have difficulty walking or climbing stairs?: No Does the patient have difficulty dressing or bathing?: No Does the patient have difficulty doing errands alone such as visiting a doctor's office or shopping?: No   Review of Systems  Constitutional: -, -unexpected weight change, -anorexia, -fatigue Allergy: -sneezing, -itching, -congestion Dermatology: denies changing moles, rash, lumps ENT: -runny nose, -ear pain, -sore throat,  Cardiology:  -chest pain, -palpitations, -orthopnea, Respiratory: -cough, -shortness of breath, -dyspnea on exertion, -wheezing,  Gastroenterology: -abdominal pain, -nausea, -vomiting, -diarrhea, -constipation, -dysphagia Hematology: -bleeding or bruising problems Musculoskeletal: -arthralgias, -myalgias, -joint swelling, -back  pain, - Ophthalmology: -vision changes,  Urology: -dysuria, -difficulty urinating,  -urinary frequency, -urgency, incontinence Neurology: -, -numbness, , -memory loss, -falls, -dizziness    PHYSICAL EXAM:  BP 120/82   Pulse 84   Temp (!) 97  F (36.1 C)   Ht 6' (1.829 m)   Wt 177 lb 6.4 oz (80.5 kg)   SpO2 94%   BMI 24.06 kg/m   General Appearance: Alert, cooperative, no distress, appears stated age Head: Normocephalic, without obvious abnormality, atraumatic Eyes: PERRL, conjunctiva/corneas clear, EOM's intact,  Ears: Normal TM's and external ear canals Nose: Nares normal, mucosa normal, no drainage or sinus   tenderness Throat: Lips, mucosa, and tongue normal; teeth and gums normal Neck: Supple, no lymphadenopathy, thyroid:no enlargement/tenderness/nodules; no carotid bruit or JVD Lungs: Clear to auscultation bilaterally without wheezes, rales or ronchi; respirations unlabored Heart: Regular rate and rhythm, S1 and S2 normal, no murmur, rub or gallop Abdomen: Soft, non-tender, nondistended, normoactive bowel sounds, no masses, no hepatosplenomegaly Extremities: No clubbing, cyanosis or edema Pulses: 2+ and symmetric all extremities Skin: Skin color, texture, turgor normal, no rashes or lesions Lymph nodes: Cervical, supraclavicular, and axillary nodes normal Neurologic: CNII-XII intact, normal strength, sensation and gait; reflexes 2+ and symmetric throughout   Psych: Normal mood, affect, hygiene and grooming  ASSESSMENT/PLAN: History of back surgery  Need for influenza vaccination - Plan: Flu Vaccine QUAD High Dose(Fluad), Flu Vaccine QUAD High Dose(Fluad)  Screening for AAA (abdominal aortic aneurysm) - Plan: US AORTA  Gastroesophageal reflux disease, unspecified whether esophagitis present  Degenerative lumbar spinal stenosis  Glucose intolerance  Erectile dysfunction, unspecified erectile dysfunction type  History of colonic polyps  Senile cataract of left eye, unspecified age-related cataract type  Former smoker  Spondylolisthesis of lumbar region At the present time he seems to be doing quite nicely.  He will continue on Nexium.  Discussed the numbness he is having in his foot and strongly  encouraged him to visually inspect his foot periodically to help with that.  We will follow-up with the various specialist that he sees regularly.   Discussed PSA screening (risks/benefits), recommended at least 30 minutes of aerobic activity at least 5 days/week; proper sunscreen use reviewed; healthy diet and alcohol recommendations (less than or equal to 2 drinks/day) reviewed; regular seatbelt use; changing batteries in smoke detectors. Immunization recommendations discussed.  Colonoscopy recommendations reviewed.   Medicare Attestation I have personally reviewed: The patient's medical and social history Their use of alcohol, tobacco or illicit drugs Their current medications and supplements The patient's functional ability including ADLs,fall risks, home safety risks, cognitive, and hearing and visual impairment Diet and physical activities Evidence for depression or mood disorders  The patient's weight, height, and BMI have been recorded in the chart.  I have made referrals, counseling, and provided education to the patient based on review of the above and I have provided the patient with a written personalized care plan for preventive services.     Jill Alexanders, MD   07/23/2021

## 2021-07-23 NOTE — Progress Notes (Addendum)
Gerald [name] is a 65 y.o. male who presents for annual wellness visit and follow-up on chronic medical conditions.  He has the following concerns:   Immunizations and Health Maintenance Immunization History  Administered Date(s) Administered   Influenza, Quadrivalent, Recombinant, Inj, Pf 08/05/2019   Influenza,inj,Quad PF,6+ Mos 09/11/2018, 07/17/2020   Influenza-Unspecified 09/11/2018   PFIZER(Purple Top)SARS-COV-2 Vaccination 01/15/2020, 02/07/2020   PPD Test 03/06/2002   Tdap 05/25/2009, 06/21/2019   Zoster Recombinat (Shingrix) 07/08/2017, 09/23/2017   Zoster, Live 07/09/2016   Health Maintenance Due  Topic Date Due   HIV Screening  Never done   COVID-19 Vaccine (3 - Booster for [place] series) 07/09/2020   INFLUENZA VACCINE  06/04/2021    Last colonoscopy:  02/01/20 Last PSA: 05/12/18 Dentist: Q six months  Ophtho: Q year Exercise: Five days a week   Other doctors caring for patient include:Dr. Henrene Pastor GI, Dr. Kathyrn Sheriff Neurosur.  Advanced Directives: Does Patient Have a Medical Advance Directive?: No Would patient like information on creating a medical advance directive?: Yes (ED - Information included in AVS)  Depression screen:  See questionnaire below.     Depression screen [facility] 2/9 07/23/2021 07/17/2020 06/21/2019 07/02/2017 10/14/2013  Decreased Interest 0 0 0 0 0  Down, Depressed, Hopeless 0 0 0 0 0  PHQ - 2 Score 0 0 0 0 0    Fall Screen: See Questionaire below.   Fall Risk  07/23/2021 07/17/2020 07/02/2017 10/14/2013  Falls in the past year? 0 0 No No  Number falls in past yr: 0 - - -  Injury with Fall? 0 - - -  Risk for fall due to : - No Fall Risks - -  Follow up Falls evaluation completed - - -    ADL screen:  See questionnaire below.  Functional Status Survey: Is the patient deaf or have difficulty hearing?: No Does the patient have difficulty seeing, even when wearing glasses/contacts?: No Does the patient have difficulty concentrating, remembering,  or making decisions?: No Does the patient have difficulty walking or climbing stairs?: No Does the patient have difficulty dressing or bathing?: No Does the patient have difficulty doing errands alone such as visiting a doctor's office or shopping?: No   Review of Systems  Constitutional: -, -unexpected weight change, -anorexia, -fatigue Allergy: -sneezing, -itching, -congestion Dermatology: denies changing moles, rash, lumps ENT: -runny nose, -ear pain, -sore throat,  Cardiology:  -chest pain, -palpitations, -orthopnea, Respiratory: -cough, -shortness of breath, -dyspnea on exertion, -wheezing,  Gastroenterology: -abdominal pain, -nausea, -vomiting, -diarrhea, -constipation, -dysphagia Hematology: -bleeding or bruising problems Musculoskeletal: -arthralgias, -myalgias, -joint swelling, -back pain, - Ophthalmology: -vision changes,  Urology: -dysuria, -difficulty urinating,  -urinary frequency, -urgency, incontinence Neurology: -, -numbness, , -memory loss, -falls, -dizziness    PHYSICAL EXAM:    General Appearance: Alert, cooperative, no distress, appears stated age Head: Normocephalic, without obvious abnormality, atraumatic Eyes: PERRL, conjunctiva/corneas clear, EOM's intact, fundi benign Ears: Normal TM's and external ear canals Nose: Nares normal, mucosa normal, no drainage or sinus   tenderness Throat: Lips, mucosa, and tongue normal; teeth and gums normal Neck: Supple, no lymphadenopathy, thyroid:no enlargement/tenderness/nodules; no carotid bruit or JVD Lungs: Clear to auscultation bilaterally without wheezes, rales or ronchi; respirations unlabored Heart: Regular rate and rhythm, S1 and S2 normal, no murmur, rub or gallop Abdomen: Soft, non-tender, nondistended, normoactive bowel sounds, no masses, no hepatosplenomegaly Extremities: No clubbing, cyanosis or edema Pulses: 2+ and symmetric all extremities Skin: Skin color, texture, turgor normal, no rashes or  lesions Lymph nodes:  Cervical, supraclavicular, and axillary nodes normal Neurologic: CNII-XII intact, normal strength, sensation and gait; reflexes 2+ and symmetric throughout   Psych: Normal mood, affect, hygiene and grooming  ASSESSMENT/PLAN: History of back surgery  Need for influenza vaccination - Plan: Flu Vaccine QUAD High Dose(Fluad), CANCELED: Flu Vaccine QUAD High Dose(Fluad)  Screening for AAA (abdominal aortic aneurysm) - Plan: US AORTA MEDICARE SCREENING, CANCELED: US AORTA  Gastroesophageal reflux disease, unspecified whether esophagitis present - Plan: CBC with Differential/Platelet, Comprehensive metabolic panel  Degenerative lumbar spinal stenosis  Glucose intolerance - Plan: CBC with Differential/Platelet, Comprehensive metabolic panel  Erectile dysfunction, unspecified erectile dysfunction type  History of colonic polyps  Senile cataract of left eye, unspecified age-related cataract type  Former smoker  Spondylolisthesis of lumbar region  Screening for prostate cancer - Plan: PSA  Screening for lipid disorders - Plan: Lipid panel    Discussed PSA screening (risks/benefits), recommended at least 30 minutes of aerobic activity at least 5 days/week; proper sunscreen use reviewed; healthy diet and alcohol recommendations (less than or equal to 2 drinks/day) reviewed; regular seatbelt use; changing batteries in smoke detectors. Immunization recommendations discussed.  Colonoscopy recommendations reviewed.   Medicare Attestation I have personally reviewed: The patient's medical and social history Their use of alcohol, tobacco or illicit drugs Their current medications and supplements The patient's functional ability including ADLs,fall risks, home safety risks, cognitive, and hearing and visual impairment Diet and physical activities Evidence for depression or mood disorders  The patient's weight, height, and BMI have been recorded in the chart.  I have made  referrals, counseling, and provided education to the patient based on review of the above and I have provided the patient with a written personalized care plan for preventive services.     Jill Alexanders, MD   07/23/2021

## 2021-07-24 LAB — LIPID PANEL
Chol/HDL Ratio: 5.3 ratio — ABNORMAL HIGH (ref 0.0–5.0)
Cholesterol, Total: 205 mg/dL — ABNORMAL HIGH (ref 100–199)
HDL: 39 mg/dL — ABNORMAL LOW (ref >39)
LDL Chol Calc (NIH): 115 mg/dL — ABNORMAL HIGH (ref 0–99)
Triglycerides: 294 mg/dL — ABNORMAL HIGH (ref 0–149)
VLDL Cholesterol Cal: 51 mg/dL — ABNORMAL HIGH (ref 5–40)

## 2021-07-24 LAB — CBC WITH DIFFERENTIAL/PLATELET
Basophils Absolute: 0 10*3/uL (ref 0.0–0.2)
Basos: 0 %
EOS (ABSOLUTE): 0.1 10*3/uL (ref 0.0–0.4)
Eos: 1 %
Hematocrit: 42.5 % (ref 37.5–51.0)
Hemoglobin: 14.2 g/dL (ref 13.0–17.7)
Immature Grans (Abs): 0 10*3/uL (ref 0.0–0.1)
Immature Granulocytes: 0 %
Lymphocytes Absolute: 2.5 10*3/uL (ref 0.7–3.1)
Lymphs: 35 %
MCH: 33.1 pg — ABNORMAL HIGH (ref 26.6–33.0)
MCHC: 33.4 g/dL (ref 31.5–35.7)
MCV: 99 fL — ABNORMAL HIGH (ref 79–97)
Monocytes Absolute: 0.5 10*3/uL (ref 0.1–0.9)
Monocytes: 8 %
Neutrophils Absolute: 4 10*3/uL (ref 1.4–7.0)
Neutrophils: 56 %
Platelets: 322 10*3/uL (ref 150–450)
RBC: 4.29 x10E6/uL (ref 4.14–5.80)
RDW: 13.6 % (ref 11.6–15.4)
WBC: 7.1 10*3/uL (ref 3.4–10.8)

## 2021-07-24 LAB — COMPREHENSIVE METABOLIC PANEL
ALT: 17 IU/L (ref 0–44)
AST: 18 IU/L (ref 0–40)
Albumin/Globulin Ratio: 1.9 (ref 1.2–2.2)
Albumin: 4.8 g/dL (ref 3.8–4.8)
Alkaline Phosphatase: 90 IU/L (ref 44–121)
BUN/Creatinine Ratio: 13 (ref 10–24)
BUN: 13 mg/dL (ref 8–27)
Bilirubin Total: 0.3 mg/dL (ref 0.0–1.2)
CO2: 22 mmol/L (ref 20–29)
Calcium: 9.8 mg/dL (ref 8.6–10.2)
Chloride: 100 mmol/L (ref 96–106)
Creatinine, Ser: 0.97 mg/dL (ref 0.76–1.27)
Globulin, Total: 2.5 g/dL (ref 1.5–4.5)
Glucose: 102 mg/dL — ABNORMAL HIGH (ref 65–99)
Potassium: 4.8 mmol/L (ref 3.5–5.2)
Sodium: 141 mmol/L (ref 134–144)
Total Protein: 7.3 g/dL (ref 6.0–8.5)
eGFR: 87 mL/min/{1.73_m2} (ref >59)

## 2021-07-24 LAB — PSA: Prostate Specific Ag, Serum: 0.4 ng/mL (ref 0.0–4.0)

## 2021-07-30 ENCOUNTER — Ambulatory Visit
Admission: RE | Admit: 2021-07-30 | Discharge: 2021-07-30 | Disposition: A | Discharge status: Home / Self Care | Payer: Medicare HMO | Source: Ambulatory Visit | Attending: Family Medicine | Admitting: Family Medicine

## 2021-07-30 DIAGNOSIS — Z136 Encounter for screening for cardiovascular disorders: Secondary | ICD-10-CM | POA: Diagnosis not present

## 2021-07-30 DIAGNOSIS — Z87891 Personal history of nicotine dependence: Secondary | ICD-10-CM | POA: Diagnosis not present

## 2021-07-31 ENCOUNTER — Telehealth: Payer: Self-pay

## 2021-07-31 MED ORDER — ATORVASTATIN CALCIUM 10 MG PO TABS: 10.0000 mg | ORAL_TABLET | Freq: Every day | ORAL | 3 refills | Status: DC

## 2021-07-31 NOTE — Telephone Encounter (Signed)
Pt. Called stating he saw his results for his aortic US and your recommendations. He did not understand some of the results on the [place], so he wanted to know if you had any time this morning if you could call him briefly just for a few minutes to explain. He can be reached this morning he said at [phone] before 10.

## 2021-08-29 ENCOUNTER — Other Ambulatory Visit: Payer: Self-pay | Admitting: Family Medicine

## 2021-09-10 DIAGNOSIS — H25813 Combined forms of age-related cataract, bilateral: Secondary | ICD-10-CM | POA: Diagnosis not present

## 2021-09-10 DIAGNOSIS — H43811 Vitreous degeneration, right eye: Secondary | ICD-10-CM | POA: Diagnosis not present

## 2021-10-01 DIAGNOSIS — Z008 Encounter for other general examination: Secondary | ICD-10-CM | POA: Diagnosis not present

## 2021-10-01 DIAGNOSIS — K219 Gastro-esophageal reflux disease without esophagitis: Secondary | ICD-10-CM | POA: Diagnosis not present

## 2021-10-01 DIAGNOSIS — E785 Hyperlipidemia, unspecified: Secondary | ICD-10-CM | POA: Diagnosis not present

## 2021-10-01 DIAGNOSIS — N529 Male erectile dysfunction, unspecified: Secondary | ICD-10-CM | POA: Diagnosis not present

## 2021-10-01 DIAGNOSIS — Z823 Family history of stroke: Secondary | ICD-10-CM | POA: Diagnosis not present

## 2021-10-01 DIAGNOSIS — H547 Unspecified visual loss: Secondary | ICD-10-CM | POA: Diagnosis not present

## 2021-10-01 DIAGNOSIS — Z720 Tobacco use: Secondary | ICD-10-CM | POA: Diagnosis not present

## 2021-11-05 ENCOUNTER — Other Ambulatory Visit: Payer: Self-pay | Admitting: Family Medicine

## 2021-11-07 ENCOUNTER — Other Ambulatory Visit: Payer: Self-pay

## 2021-11-07 ENCOUNTER — Encounter: Payer: Self-pay | Admitting: Family Medicine

## 2021-11-07 ENCOUNTER — Ambulatory Visit (INDEPENDENT_AMBULATORY_CARE_PROVIDER_SITE_OTHER): Payer: Medicare HMO | Admitting: Family Medicine

## 2021-11-07 VITALS — BP 122/68 | HR 75 | Temp 98.1°F | Wt 184.0 lb

## 2021-11-07 DIAGNOSIS — I4510 Unspecified right bundle-branch block: Secondary | ICD-10-CM | POA: Diagnosis not present

## 2021-11-07 DIAGNOSIS — R0602 Shortness of breath: Secondary | ICD-10-CM

## 2021-11-07 NOTE — Progress Notes (Signed)
° °  Subjective:    Patient ID: Gerald [name], male    DOB: [birth year], 66 y.o.   MRN: [id number]  HPI He states that last Friday while playing golf he became woozy and had some shortness of breath but no chest pain or sweating.  This lasted for several minutes.  He went back to the clubhouse and did fine after that.  Since then he has not had any symptoms like that and has had no previous history of symptoms like this. He is on no new meds.  Alcohol consumption is actually diminished that he has had none in relation to this episode.  Review of Systems     Objective:   Physical Exam Alert and in no distress.  Cardiac exam shows a slightly irregular rhythm.  Lungs are clear to auscultation. EKG read by me shows an incomplete right bundle block with a ventricular rate of 73.  No other abnormalities noted.      Assessment & Plan:  Incomplete right bundle branch block  Shortness of breath I discussed the diagnosis of right bundle block and explained it is not of any major concern. I then discussed the shortness of breath and woozy feeling in terms of checking his pulse and explained to him how to do that.  If he does have this again he will then let me know how rapid the pulse is and is it regular or irregular.  Explained the possibility of calling EMS concerning this.

## 2021-11-07 NOTE — Addendum Note (Signed)
Addended by: Denita Lung on: 11/07/2021 04:52 PM   Modules accepted: Orders

## 2021-12-03 ENCOUNTER — Other Ambulatory Visit: Payer: Self-pay | Admitting: Family Medicine

## 2021-12-17 DIAGNOSIS — H35361 Drusen (degenerative) of macula, right eye: Secondary | ICD-10-CM | POA: Diagnosis not present

## 2021-12-17 DIAGNOSIS — M4306 Spondylolysis, lumbar region: Secondary | ICD-10-CM | POA: Diagnosis not present

## 2021-12-17 DIAGNOSIS — H25813 Combined forms of age-related cataract, bilateral: Secondary | ICD-10-CM | POA: Diagnosis not present

## 2021-12-17 DIAGNOSIS — R030 Elevated blood-pressure reading, without diagnosis of hypertension: Secondary | ICD-10-CM | POA: Diagnosis not present

## 2021-12-17 DIAGNOSIS — M4316 Spondylolisthesis, lumbar region: Secondary | ICD-10-CM | POA: Diagnosis not present

## 2021-12-17 DIAGNOSIS — H35412 Lattice degeneration of retina, left eye: Secondary | ICD-10-CM | POA: Diagnosis not present

## 2021-12-17 DIAGNOSIS — H338 Other retinal detachments: Secondary | ICD-10-CM | POA: Diagnosis not present

## 2021-12-17 DIAGNOSIS — H31092 Other chorioretinal scars, left eye: Secondary | ICD-10-CM | POA: Diagnosis not present

## 2021-12-17 DIAGNOSIS — H59811 Chorioretinal scars after surgery for detachment, right eye: Secondary | ICD-10-CM | POA: Diagnosis not present

## 2021-12-17 DIAGNOSIS — H5213 Myopia, bilateral: Secondary | ICD-10-CM | POA: Diagnosis not present

## 2022-02-04 DIAGNOSIS — Z85828 Personal history of other malignant neoplasm of skin: Secondary | ICD-10-CM | POA: Diagnosis not present

## 2022-02-04 DIAGNOSIS — D225 Melanocytic nevi of trunk: Secondary | ICD-10-CM | POA: Diagnosis not present

## 2022-02-04 DIAGNOSIS — D2261 Melanocytic nevi of right upper limb, including shoulder: Secondary | ICD-10-CM | POA: Diagnosis not present

## 2022-02-04 DIAGNOSIS — L570 Actinic keratosis: Secondary | ICD-10-CM | POA: Diagnosis not present

## 2022-02-04 DIAGNOSIS — D2262 Melanocytic nevi of left upper limb, including shoulder: Secondary | ICD-10-CM | POA: Diagnosis not present

## 2022-02-04 DIAGNOSIS — L821 Other seborrheic keratosis: Secondary | ICD-10-CM | POA: Diagnosis not present

## 2022-02-04 DIAGNOSIS — D1801 Hemangioma of skin and subcutaneous tissue: Secondary | ICD-10-CM | POA: Diagnosis not present

## 2022-03-02 ENCOUNTER — Other Ambulatory Visit: Payer: Self-pay | Admitting: Family Medicine

## 2022-04-22 DIAGNOSIS — M7711 Lateral epicondylitis, right elbow: Secondary | ICD-10-CM | POA: Diagnosis not present

## 2022-05-09 ENCOUNTER — Ambulatory Visit (INDEPENDENT_AMBULATORY_CARE_PROVIDER_SITE_OTHER): Payer: Medicare HMO | Admitting: Family Medicine

## 2022-05-09 ENCOUNTER — Encounter: Payer: Self-pay | Admitting: Family Medicine

## 2022-05-09 VITALS — BP 124/68 | HR 85 | Temp 97.5°F | Wt 181.4 lb

## 2022-05-09 DIAGNOSIS — R292 Abnormal reflex: Secondary | ICD-10-CM

## 2022-05-09 DIAGNOSIS — G629 Polyneuropathy, unspecified: Secondary | ICD-10-CM | POA: Diagnosis not present

## 2022-05-09 DIAGNOSIS — R252 Cramp and spasm: Secondary | ICD-10-CM | POA: Diagnosis not present

## 2022-05-09 DIAGNOSIS — Z13220 Encounter for screening for lipoid disorders: Secondary | ICD-10-CM | POA: Diagnosis not present

## 2022-05-09 MED ORDER — GABAPENTIN 100 MG PO CAPS: 100.0000 mg | ORAL_CAPSULE | Freq: Every day | ORAL | 1 refills | Status: DC

## 2022-05-09 NOTE — Progress Notes (Signed)
   Subjective:    Patient ID: Gerald [name], male    DOB: 03-08-1956, 66 y.o.   MRN: [id number]  HPI He complains of difficulty with cramping that started initially on the left and then he has also had it on the right.  There was 1 occasion that the cramping was so severe, it causes difficulty with vomiting.  He also complains of some neuropathic pain from previous back surgery he has used Neurontin in the past which she is unsure if it helped at all.  He plays golf regularly but keeps himself well-hydrated.  He also stopped his statin for a short period of time to see if that would make a difference which apparently did not.   Review of Systems     Objective:   Physical Exam Alert and in no distress.  Exam of both legs shows the skin to be normal.  Pulses are normal.  Reflexes in the knee and ankle were diminished.  Clonus was negative.  He does complain of decreased sensation over the right lower calf and plantar area.       Assessment & Plan:  Leg cramps - Plan: CBC with Differential/Platelet, Comprehensive metabolic panel, Lipid panel, Magnesium  Hyporeflexia - Plan: CBC with Differential/Platelet, Comprehensive metabolic panel, TSH  Neuropathy - Plan: gabapentin (NEURONTIN) 100 MG capsule Gmail okay to give with a I will have him use it in years The etiology of his cramping is really unclear and I will therefore check his electrolytes.  Discussed use of Neurontin and he would like to try this again which I see no harm in it.  Did recommend he start back on his statin drug.  He will keep me informed concerning the benefit of the Neurontin and that we can increase this.  Discussed the fact that that did not want to push the Neurontin dose to maximal levels.

## 2022-05-10 LAB — LIPID PANEL
Chol/HDL Ratio: 4.4 ratio (ref 0.0–5.0)
Cholesterol, Total: 190 mg/dL (ref 100–199)
HDL: 43 mg/dL (ref >39)
LDL Chol Calc (NIH): 90 mg/dL (ref 0–99)
Triglycerides: 343 mg/dL — ABNORMAL HIGH (ref 0–149)
VLDL Cholesterol Cal: 57 mg/dL — ABNORMAL HIGH (ref 5–40)

## 2022-05-10 LAB — COMPREHENSIVE METABOLIC PANEL
ALT: 24 IU/L (ref 0–44)
AST: 19 IU/L (ref 0–40)
Albumin/Globulin Ratio: 2.2 (ref 1.2–2.2)
Albumin: 4.7 g/dL (ref 3.8–4.8)
Alkaline Phosphatase: 85 IU/L (ref 44–121)
BUN/Creatinine Ratio: 16 (ref 10–24)
BUN: 15 mg/dL (ref 8–27)
Bilirubin Total: 0.3 mg/dL (ref 0.0–1.2)
CO2: 23 mmol/L (ref 20–29)
Calcium: 9.3 mg/dL (ref 8.6–10.2)
Chloride: 101 mmol/L (ref 96–106)
Creatinine, Ser: 0.94 mg/dL (ref 0.76–1.27)
Globulin, Total: 2.1 g/dL (ref 1.5–4.5)
Glucose: 107 mg/dL — ABNORMAL HIGH (ref 70–99)
Potassium: 4.4 mmol/L (ref 3.5–5.2)
Sodium: 138 mmol/L (ref 134–144)
Total Protein: 6.8 g/dL (ref 6.0–8.5)
eGFR: 90 mL/min/{1.73_m2} (ref >59)

## 2022-05-10 LAB — CBC WITH DIFFERENTIAL/PLATELET
Basophils Absolute: 0 10*3/uL (ref 0.0–0.2)
Basos: 0 %
EOS (ABSOLUTE): 0.2 10*3/uL (ref 0.0–0.4)
Eos: 2 %
Hematocrit: 40 % (ref 37.5–51.0)
Hemoglobin: 13.7 g/dL (ref 13.0–17.7)
Immature Grans (Abs): 0 10*3/uL (ref 0.0–0.1)
Immature Granulocytes: 0 %
Lymphocytes Absolute: 2.5 10*3/uL (ref 0.7–3.1)
Lymphs: 35 %
MCH: 32.9 pg (ref 26.6–33.0)
MCHC: 34.3 g/dL (ref 31.5–35.7)
MCV: 96 fL (ref 79–97)
Monocytes Absolute: 0.6 10*3/uL (ref 0.1–0.9)
Monocytes: 8 %
Neutrophils Absolute: 3.9 10*3/uL (ref 1.4–7.0)
Neutrophils: 55 %
Platelets: 296 10*3/uL (ref 150–450)
RBC: 4.16 x10E6/uL (ref 4.14–5.80)
RDW: 13.2 % (ref 11.6–15.4)
WBC: 7.2 10*3/uL (ref 3.4–10.8)

## 2022-05-10 LAB — TSH: TSH: 2.66 u[IU]/mL (ref 0.450–4.500)

## 2022-05-10 LAB — MAGNESIUM: Magnesium: 2.2 mg/dL (ref 1.6–2.3)

## 2022-06-05 ENCOUNTER — Other Ambulatory Visit: Payer: Self-pay | Admitting: Family Medicine

## 2022-07-01 ENCOUNTER — Other Ambulatory Visit: Payer: Self-pay | Admitting: Family Medicine

## 2022-07-01 DIAGNOSIS — G629 Polyneuropathy, unspecified: Secondary | ICD-10-CM

## 2022-07-01 NOTE — Telephone Encounter (Signed)
Is this okay to refill? 

## 2022-07-10 ENCOUNTER — Encounter: Payer: Self-pay | Admitting: Internal Medicine

## 2022-07-17 ENCOUNTER — Other Ambulatory Visit: Payer: Self-pay | Admitting: Family Medicine

## 2022-07-29 ENCOUNTER — Ambulatory Visit: Payer: Medicare HMO

## 2022-07-29 DIAGNOSIS — M25521 Pain in right elbow: Secondary | ICD-10-CM | POA: Diagnosis not present

## 2022-08-04 DIAGNOSIS — I700 Atherosclerosis of aorta: Secondary | ICD-10-CM | POA: Insufficient documentation

## 2022-08-04 DIAGNOSIS — E781 Pure hyperglyceridemia: Secondary | ICD-10-CM | POA: Insufficient documentation

## 2022-08-05 ENCOUNTER — Encounter: Payer: Self-pay | Admitting: Family Medicine

## 2022-08-05 ENCOUNTER — Ambulatory Visit (INDEPENDENT_AMBULATORY_CARE_PROVIDER_SITE_OTHER): Payer: Medicare HMO | Admitting: Family Medicine

## 2022-08-05 VITALS — BP 120/66 | HR 85 | Temp 97.0°F | Ht 71.75 in | Wt 182.2 lb

## 2022-08-05 DIAGNOSIS — I700 Atherosclerosis of aorta: Secondary | ICD-10-CM | POA: Diagnosis not present

## 2022-08-05 DIAGNOSIS — K219 Gastro-esophageal reflux disease without esophagitis: Secondary | ICD-10-CM | POA: Diagnosis not present

## 2022-08-05 DIAGNOSIS — Z8719 Personal history of other diseases of the digestive system: Secondary | ICD-10-CM | POA: Diagnosis not present

## 2022-08-05 DIAGNOSIS — Z125 Encounter for screening for malignant neoplasm of prostate: Secondary | ICD-10-CM | POA: Diagnosis not present

## 2022-08-05 DIAGNOSIS — E7439 Other disorders of intestinal carbohydrate absorption: Secondary | ICD-10-CM | POA: Diagnosis not present

## 2022-08-05 DIAGNOSIS — R7303 Prediabetes: Secondary | ICD-10-CM | POA: Diagnosis not present

## 2022-08-05 DIAGNOSIS — E1136 Type 2 diabetes mellitus with diabetic cataract: Secondary | ICD-10-CM | POA: Insufficient documentation

## 2022-08-05 DIAGNOSIS — M4807 Spinal stenosis, lumbosacral region: Secondary | ICD-10-CM | POA: Diagnosis not present

## 2022-08-05 DIAGNOSIS — Z23 Encounter for immunization: Secondary | ICD-10-CM | POA: Diagnosis not present

## 2022-08-05 DIAGNOSIS — Z86010 Personal history of colonic polyps: Secondary | ICD-10-CM | POA: Diagnosis not present

## 2022-08-05 DIAGNOSIS — M48061 Spinal stenosis, lumbar region without neurogenic claudication: Secondary | ICD-10-CM | POA: Diagnosis not present

## 2022-08-05 DIAGNOSIS — G629 Polyneuropathy, unspecified: Secondary | ICD-10-CM | POA: Diagnosis not present

## 2022-08-05 DIAGNOSIS — E781 Pure hyperglyceridemia: Secondary | ICD-10-CM | POA: Diagnosis not present

## 2022-08-05 DIAGNOSIS — Z87891 Personal history of nicotine dependence: Secondary | ICD-10-CM

## 2022-08-05 DIAGNOSIS — Z9889 Other specified postprocedural states: Secondary | ICD-10-CM

## 2022-08-05 DIAGNOSIS — Z0000 Encounter for general adult medical examination without abnormal findings: Secondary | ICD-10-CM

## 2022-08-05 DIAGNOSIS — H259 Unspecified age-related cataract: Secondary | ICD-10-CM

## 2022-08-05 LAB — POCT GLYCOSYLATED HEMOGLOBIN (HGB A1C): Hemoglobin A1C: 6.3 % — AB (ref 4.0–5.6)

## 2022-08-05 NOTE — Patient Instructions (Signed)
Health Maintenance, Male Adopting a healthy lifestyle and getting preventive care are important in promoting health and wellness. Ask your health care provider about: The right schedule for you to have regular tests and exams. Things you can do on your own to prevent diseases and keep yourself healthy. What should I know about diet, weight, and exercise? Eat a healthy diet  Eat a diet that includes plenty of vegetables, fruits, low-fat dairy products, and lean protein. Do not eat a lot of foods that are high in solid fats, added sugars, or sodium. Maintain a healthy weight Body mass index (BMI) is a measurement that can be used to identify possible weight problems. It estimates body fat based on height and weight. Your health care provider can help determine your BMI and help you achieve or maintain a healthy weight. Get regular exercise Get regular exercise. This is one of the most important things you can do for your health. Most adults should: Exercise for at least 150 minutes each week. The exercise should increase your heart rate and make you sweat (moderate-intensity exercise). Do strengthening exercises at least twice a week. This is in addition to the moderate-intensity exercise. Spend less time sitting. Even light physical activity can be beneficial. Watch cholesterol and blood lipids Have your blood tested for lipids and cholesterol at 66 years of age, then have this test every 5 years. You may need to have your cholesterol levels checked more often if: Your lipid or cholesterol levels are high. You are older than 66 years of age. You are at high risk for heart disease. What should I know about cancer screening? Many types of cancers can be detected early and may often be prevented. Depending on your health history and family history, you may need to have cancer screening at various ages. This may include screening for: Colorectal cancer. Prostate cancer. Skin cancer. Lung  cancer. What should I know about heart disease, diabetes, and high blood pressure? Blood pressure and heart disease High blood pressure causes heart disease and increases the risk of stroke. This is more likely to develop in people who have high blood pressure readings or are overweight. Talk with your health care provider about your target blood pressure readings. Have your blood pressure checked: Every 3-5 years if you are 18-39 years of age. Every year if you are 40 years old or older. If you are between the ages of 65 and 75 and are a current or former smoker, ask your health care provider if you should have a one-time screening for abdominal aortic aneurysm (AAA). Diabetes Have regular diabetes screenings. This checks your fasting blood sugar level. Have the screening done: Once every three years after age 45 if you are at a normal weight and have a low risk for diabetes. More often and at a younger age if you are overweight or have a high risk for diabetes. What should I know about preventing infection? Hepatitis B If you have a higher risk for hepatitis B, you should be screened for this virus. Talk with your health care provider to find out if you are at risk for hepatitis B infection. Hepatitis C Blood testing is recommended for: Everyone born from 1945 through 1965. Anyone with known risk factors for hepatitis C. Sexually transmitted infections (STIs) You should be screened each year for STIs, including gonorrhea and chlamydia, if: You are sexually active and are younger than 66 years of age. You are older than 66 years of age and your   health care provider tells you that you are at risk for this type of infection. Your sexual activity has changed since you were last screened, and you are at increased risk for chlamydia or gonorrhea. Ask your health care provider if you are at risk. Ask your health care provider about whether you are at high risk for HIV. Your health care provider  may recommend a prescription medicine to help prevent HIV infection. If you choose to take medicine to prevent HIV, you should first get tested for HIV. You should then be tested every 3 months for as long as you are taking the medicine. Follow these instructions at home: Alcohol use Do not drink alcohol if your health care provider tells you not to drink. If you drink alcohol: Limit how much you have to 0-2 drinks a day. Know how much alcohol is in your drink. In the [place] one drink equals one 12 oz bottle of beer (355 mL), one 5 oz glass of wine (148 mL), or one 1 oz glass of hard liquor (44 mL). Lifestyle Do not use any products that contain nicotine or tobacco. These products include cigarettes, chewing tobacco, and vaping devices, such as e-cigarettes. If you need help quitting, ask your health care provider. Do not use street drugs. Do not share needles. Ask your health care provider for help if you need support or information about quitting drugs. General instructions Schedule regular health, dental, and eye exams. Stay current with your vaccines. Tell your health care provider if: You often feel depressed. You have ever been abused or do not feel safe at home. Summary Adopting a healthy lifestyle and getting preventive care are important in promoting health and wellness. Follow your health care provider's instructions about healthy diet, exercising, and getting tested or screened for diseases. Follow your health care provider's instructions on monitoring your cholesterol and blood pressure. This information is not intended to replace advice given to you by your health care provider. Make sure you discuss any questions you have with your health care provider. Document Revised: 03/12/2021 Document Reviewed: 03/12/2021 Elsevier Patient Education  2023 Elsevier Inc.  

## 2022-08-05 NOTE — Progress Notes (Signed)
Gerald [name] is a 66 y.o. male who presents for annual wellness visit and follow-up on chronic medical conditions.  He has continued to difficulty with backaches as well as generalized arthritic type symptoms.  He does exercise regularly.  He does play golf and occasionally will walk.  He was taking Neurontin for the neuropathic pain because of this but has stopped it stating he did not think it was really helping much and he seems to be fairly active with that.  He does have a history of colonic polyps and is scheduled for repeat colonoscopy in 2026 however recently he has had some difficulty with loose stools and does have an appointment to see his gastroenterologist.  He also plans to have a cataract removed sometime later this year.  He continues on Lipitor.  Triglycerides are elevated.  He rarely needs Cialis.  He uses Nexium regularly but has not had reflux symptoms in quite some time.  He would like a PSA done as he has had several friends that have had difficulty with prostate cancer.  Aortic atherosclerosis was identified when evaluated for AAA.  Previous blood work did show slightly elevated glucose.  His marriage and home life are going quite well.   Immunizations and Health Maintenance Immunization History  Administered Date(s) Administered   Fluad Quad(high Dose 65+) 07/23/2021, 08/05/2022   Influenza, Quadrivalent, Recombinant, Inj, Pf 08/05/2019   Influenza,inj,Quad PF,6+ Mos 09/11/2018, 07/17/2020   Influenza-Unspecified 09/11/2018   PFIZER(Purple Top)SARS-COV-2 Vaccination 01/15/2020, 02/07/2020, 10/26/2020   PPD Test 03/06/2002   Tdap 05/25/2009, 06/21/2019   Zoster Recombinat (Shingrix) 07/08/2017, 09/23/2017   Zoster, Live 07/09/2016   Health Maintenance Due  Topic Date Due   COVID-19 Vaccine (4 - Pfizer series) 12/21/2020    Last colonoscopy:  02/01/20 Dr. Henrene [name]  Last PSA: 07/23/21 (0.4) Dentist: Q six months  Ophtho: Q year Exercise: seven days a week  Other  doctors caring for patient include: Dr. Henrene [name] GI            Gerald Custard PA ortho            Dr. Posey [name] eye             Dr. Katy [name] eye  Advanced Directives: Does Patient Have a Medical Advance Directive?: No Would patient like information on creating a medical advance directive?: Yes (ED - Information included in AVS)  Depression screen:  See questionnaire below.        08/05/2022    8:26 AM 07/23/2021    8:31 AM 07/17/2020    8:22 AM 06/21/2019    2:37 PM 07/02/2017   10:12 AM  Depression screen PHQ 2/9  Decreased Interest 0 0 0 0 0  Down, Depressed, Hopeless 0 0 0 0 0  PHQ - 2 Score 0 0 0 0 0    Fall Screen: See Questionaire below.      08/05/2022    8:26 AM 07/25/2022   11:02 AM 07/23/2021    8:30 AM 07/17/2020    8:22 AM 07/02/2017   10:12 AM  Fall Risk   Falls in the past year? 0 0 0 0 No  Number falls in past yr: 0 0 0    Injury with Fall? 0 0 0    Risk for fall due to : No Fall Risks   No Fall Risks   Follow up Falls evaluation completed  Falls evaluation completed      ADL screen:  See questionnaire below.  Functional Status Survey: Is the  patient deaf or have difficulty hearing?: No Does the patient have difficulty seeing, even when wearing glasses/contacts?: No Does the patient have difficulty concentrating, remembering, or making decisions?: No Does the patient have difficulty walking or climbing stairs?: No Does the patient have difficulty dressing or bathing?: No Does the patient have difficulty doing errands alone such as visiting a doctor's office or shopping?: No   Review of Systems  Constitutional: -, -unexpected weight change, -anorexia, -fatigue Allergy: -sneezing, -itching, -congestion Dermatology: denies changing moles, rash, lumps ENT: -runny nose, -ear pain, -sore throat,  Cardiology:  -chest pain, -palpitations, -orthopnea, Respiratory: -cough, -shortness of breath, -dyspnea on exertion, -wheezing,  Gastroenterology: -abdominal pain, -nausea, -vomiting,  -diarrhea, -constipation, -dysphagia Hematology: -bleeding or bruising problems Musculoskeletal: -arthralgias, -myalgias, -joint swelling, -back pain, - Ophthalmology: -vision changes,  Urology: -dysuria, -difficulty urinating,  -urinary frequency, -urgency, incontinence Neurology: -, -numbness, , -memory loss, -falls, -dizziness  PHYSICAL EXAM:  General Appearance: Alert, cooperative, no distress, appears stated age Head: Normocephalic, without obvious abnormality, atraumatic Eyes: PERRL, conjunctiva/corneas clear, EOM's intact,  Ears: Normal TM's and external ear canals Nose: Nares normal, mucosa normal, no drainage or sinus   tenderness Throat: Lips, mucosa, and tongue normal; teeth and gums normal Neck: Supple, no lymphadenopathy, thyroid:no enlargement/tenderness/nodules; no carotid bruit or JVD Lungs: Clear to auscultation bilaterally without wheezes, rales or ronchi; respirations unlabored Heart: Regular rate and rhythm, S1 and S2 normal, no murmur, rub or gallop Abdomen: Soft, non-tender, nondistended, normoactive bowel sounds, no masses, no hepatosplenomegaly Extremities: No clubbing, cyanosis or edema Pulses: 2+ and symmetric all extremities Skin: Skin color, texture, turgor normal, no rashes or lesions Lymph nodes: Cervical, supraclavicular, and axillary nodes normal Neurologic: CNII-XII intact, normal strength, sensation and gait; reflexes 2+ and symmetric throughout   Psych: Normal mood, affect, hygiene and grooming  A1c is 6.3 ASSESSMENT/PLAN: Routine general medical examination at a health care facility  Gastroesophageal reflux disease, unspecified whether esophagitis present  Neuropathy  Neuroforaminal stenosis of lumbosacral spine  Degenerative lumbar spinal stenosis  Former smoker  Glucose intolerance - Plan: POCT glycosylated hemoglobin (Hb A1C)  History of back surgery  History of colonic polyps  History of ischemic bowel disease  Aortic  atherosclerosis (HCC)  Hypertriglyceridemia - Plan: Lipid panel  Screening for prostate cancer - Plan: PSA  Need for influenza vaccination - Plan: Flu Vaccine QUAD High Dose(Fluad)  Prediabetes  Senile cataract of left eye, unspecified age-related cataract type    Discussed PSA screening (risks/benefits), recommended at least 30 minutes of aerobic activity at least 5 days/week; recommend he include stretching exercises to help with his back.u se reviewed; healthy diet and alcohol recommendations (less than or equal to 2 drinks/day) reviewed; this gust cutting back on carbohydrates to help with his prediabetes status.  Immunization recommendations discussed.  He is not interested in getting another COVID.  Colonoscopy recommendations reviewed. Recommend he cut back on his Nexium to see if he can take it every other day or even every third day to control his reflux symptoms and possible use an H2 blocker instead.  Medicare Attestation I have personally reviewed: The patient's medical and social history Their use of alcohol, tobacco or illicit drugs Their current medications and supplements The patient's functional ability including ADLs,fall risks, home safety risks, cognitive, and hearing and visual impairment Diet and physical activities Evidence for depression or mood disorders  The patient's weight, height, and BMI have been recorded in the chart.  I have made referrals, counseling, and provided education to the  patient based on review of the above and I have provided the patient with a written personalized care plan for preventive services.     Jill Alexanders, MD   08/05/2022

## 2022-08-06 LAB — LIPID PANEL
Chol/HDL Ratio: 3.8 ratio (ref 0.0–5.0)
Cholesterol, Total: 168 mg/dL (ref 100–199)
HDL: 44 mg/dL (ref >39)
LDL Chol Calc (NIH): 90 mg/dL (ref 0–99)
Triglycerides: 203 mg/dL — ABNORMAL HIGH (ref 0–149)
VLDL Cholesterol Cal: 34 mg/dL (ref 5–40)

## 2022-08-06 LAB — PSA: Prostate Specific Ag, Serum: 0.4 ng/mL (ref 0.0–4.0)

## 2022-08-13 ENCOUNTER — Encounter: Payer: Self-pay | Admitting: Internal Medicine

## 2022-08-26 ENCOUNTER — Encounter: Payer: Self-pay | Admitting: Internal Medicine

## 2022-08-26 ENCOUNTER — Ambulatory Visit: Payer: Medicare HMO | Admitting: Internal Medicine

## 2022-08-26 VITALS — BP 122/76 | HR 64 | Ht 71.0 in | Wt 184.0 lb

## 2022-08-26 DIAGNOSIS — Z86010 Personal history of colonic polyps: Secondary | ICD-10-CM

## 2022-08-26 DIAGNOSIS — K222 Esophageal obstruction: Secondary | ICD-10-CM | POA: Diagnosis not present

## 2022-08-26 DIAGNOSIS — R194 Change in bowel habit: Secondary | ICD-10-CM | POA: Diagnosis not present

## 2022-08-26 DIAGNOSIS — K219 Gastro-esophageal reflux disease without esophagitis: Secondary | ICD-10-CM | POA: Diagnosis not present

## 2022-08-26 NOTE — Patient Instructions (Signed)
_______________________________________________________  If you are age 66 or older, your body mass index should be between 23-30. Your Body mass index is 25.66 kg/m. If this is out of the aforementioned range listed, please consider follow up with your Primary Care Provider.  If you are age 85 or younger, your body mass index should be between 19-25. Your Body mass index is 25.66 kg/m. If this is out of the aformentioned range listed, please consider follow up with your Primary Care Provider.   ________________________________________________________  The  GI providers would like to encourage you to use [facility] to communicate with providers for non-urgent requests or questions.  Due to long hold times on the telephone, sending your provider a message by [facility] may be a faster and more efficient way to get a response.  Please allow 48 business hours for a response.  Please remember that this is for non-urgent requests.  _______________________________________________________  Take 1-2 tablespoons of Citrucel daily  Take Align otc once a day for 4 weeks  You may use Imodium as needed

## 2022-08-26 NOTE — Progress Notes (Signed)
HISTORY OF PRESENT ILLNESS:  Gerald [name] is a 66 y.o. male who presents today with chief complaint of change in bowel habits of 3 months duration.  Patient reports that he generally has 3 bowel movements per day, generally in the morning.  They are formed.  However, over the past 3 months he reports the third bowel movement of the morning tends to be loose.  No urgency.  No bleeding.  He has some bloating.  Good appetite.  No pain.  No weight loss.  No nocturnal symptoms.  No new medications.  No new diet.  He did have to find the bathroom on 1 occasion, quickly, while golfing.  He is concerned that this may be a sign of something serious.  He recently underwent evaluation with his PCP.  Reviewed.  Unremarkable.  Comprehensive blood work performed on August 05, 2022 was unremarkable except for a hemoglobin A1c of 6.3.  Normal hemoglobin of 14.2.  Normal comprehensive metabolic panel except for glucose 107.  The patient underwent upper endoscopy November 11, 2013.  Found to have a large caliber distal esophageal stricture.  Otherwise normal.  Diagnosed with GERD.  He takes Nexium.  He also has a history of adenomatous colon polyps.  Last colonoscopy March 2021 was negative for neoplasia.  Follow-up in 5 years recommended due to history of multiple adenomatous  REVIEW OF SYSTEMS:  All non-GI ROS negative unless otherwise stated in the HPI except for cramps  Past Medical History:  Diagnosis Date   Complication of anesthesia    Deviated septum 09/13/14   severe   Duodenal ulcer    Esophageal stricture    GERD (gastroesophageal reflux disease)    Hyperlipemia    no meds    Leg length discrepancy    PONV (postoperative nausea and vomiting)    PUD (peptic ulcer disease)    Smoker     Past Surgical History:  Procedure Laterality Date   BIOPSY SHOULDER Bilateral 03/25/2018   superficial basal cell carcinoma   COLONOSCOPY  2007   EYE SURGERY Right    retinal tear   KNEE ARTHROSCOPY      POLYPECTOMY     UPPER GASTROINTESTINAL ENDOSCOPY  2015    Social History Gerald [name]  reports that he quit smoking about 6 years ago. His smoking use included cigarettes. He smoked an average of .25 packs per day. He has never used smokeless tobacco. He reports current alcohol use. He reports that he does not use drugs.  family history includes Arthritis in his mother; Colon polyps in his mother; Stroke in his father.  No Known Allergies     PHYSICAL EXAMINATION: Vital signs: BP 122/76   Pulse 64   Ht '5\' 11"'$  (1.803 m)   Wt 184 lb (83.5 kg)   BMI 25.66 kg/m   Constitutional: generally well-appearing, no acute distress Psychiatric: alert and oriented x3, cooperative Eyes: extraocular movements intact, anicteric, conjunctiva pink Mouth: oral pharynx moist, no lesions Neck: supple no lymphadenopathy Cardiovascular: heart regular rate and rhythm, no murmur Lungs: clear to auscultation bilaterally Abdomen: soft, nontender, nondistended, no obvious ascites, no peritoneal signs, normal bowel sounds, no organomegaly Rectal: Omitted Extremities: no clubbing, cyanosis, or lower extremity edema bilaterally Skin: no lesions on visible extremities Neuro: No focal deficits.  Cranial is intact  ASSESSMENT:  1.  Nonspecific change in bowel habits as described.  No worrisome or alarm features. 2.  Personal history of adenomatous colon polyps.  Surveillance up-to-date 3.  GERD.  Asymptomatic on Nexium 4.  Health related anxiety   PLAN:  1.  Citrucel 1 to 2 tablespoons daily 2.  Probiotic align 1 daily for 4 weeks 3.  Low-dose Imodium as needed 4.  Routine office follow-up 3 months.  Contact the office in the interim for any questions or problems A total time of 30 minutes was spent preparing to see the patient, reviewing data, reviewing outside reports, obtaining comprehensive history, performing medically appropriate physical examination, directing medical therapy.  Providing  follow-up parameters, and documenting clinical information in the health record

## 2022-09-02 ENCOUNTER — Other Ambulatory Visit: Payer: Self-pay | Admitting: Family Medicine

## 2022-09-16 DIAGNOSIS — H25811 Combined forms of age-related cataract, right eye: Secondary | ICD-10-CM | POA: Diagnosis not present

## 2022-09-16 DIAGNOSIS — H43811 Vitreous degeneration, right eye: Secondary | ICD-10-CM | POA: Diagnosis not present

## 2022-10-08 ENCOUNTER — Other Ambulatory Visit: Payer: Self-pay | Admitting: Family Medicine

## 2022-10-08 DIAGNOSIS — H25811 Combined forms of age-related cataract, right eye: Secondary | ICD-10-CM | POA: Diagnosis not present

## 2022-10-08 DIAGNOSIS — H52221 Regular astigmatism, right eye: Secondary | ICD-10-CM | POA: Diagnosis not present

## 2022-10-24 DIAGNOSIS — M25521 Pain in right elbow: Secondary | ICD-10-CM | POA: Diagnosis not present

## 2022-11-09 ENCOUNTER — Other Ambulatory Visit: Payer: Self-pay | Admitting: Family Medicine

## 2022-12-23 DIAGNOSIS — H353131 Nonexudative age-related macular degeneration, bilateral, early dry stage: Secondary | ICD-10-CM | POA: Diagnosis not present

## 2022-12-23 DIAGNOSIS — H35371 Puckering of macula, right eye: Secondary | ICD-10-CM | POA: Diagnosis not present

## 2022-12-23 DIAGNOSIS — H35413 Lattice degeneration of retina, bilateral: Secondary | ICD-10-CM | POA: Diagnosis not present

## 2022-12-23 DIAGNOSIS — H31091 Other chorioretinal scars, right eye: Secondary | ICD-10-CM | POA: Diagnosis not present

## 2022-12-23 DIAGNOSIS — Z961 Presence of intraocular lens: Secondary | ICD-10-CM | POA: Diagnosis not present

## 2022-12-23 DIAGNOSIS — H25812 Combined forms of age-related cataract, left eye: Secondary | ICD-10-CM | POA: Diagnosis not present

## 2022-12-23 DIAGNOSIS — H35453 Secondary pigmentary degeneration, bilateral: Secondary | ICD-10-CM | POA: Diagnosis not present

## 2022-12-23 DIAGNOSIS — H18519 Endothelial corneal dystrophy, unspecified eye: Secondary | ICD-10-CM | POA: Diagnosis not present

## 2022-12-23 DIAGNOSIS — H35363 Drusen (degenerative) of macula, bilateral: Secondary | ICD-10-CM | POA: Diagnosis not present

## 2022-12-26 DIAGNOSIS — Z6825 Body mass index (BMI) 25.0-25.9, adult: Secondary | ICD-10-CM | POA: Diagnosis not present

## 2022-12-26 DIAGNOSIS — M4316 Spondylolisthesis, lumbar region: Secondary | ICD-10-CM | POA: Diagnosis not present

## 2023-01-21 DIAGNOSIS — R69 Illness, unspecified: Secondary | ICD-10-CM | POA: Diagnosis not present

## 2023-01-27 ENCOUNTER — Telehealth: Payer: Self-pay | Admitting: Internal Medicine

## 2023-01-27 NOTE — Telephone Encounter (Signed)
Patient called said he was having stomach pain seeking advice.

## 2023-01-27 NOTE — Telephone Encounter (Signed)
Pt scheduled to see Dr. Henrene Pastor 03/24/23 at 4pm. He wanted to schedule a f/u appt for diarrhea.

## 2023-02-10 DIAGNOSIS — B078 Other viral warts: Secondary | ICD-10-CM | POA: Diagnosis not present

## 2023-02-10 DIAGNOSIS — D485 Neoplasm of uncertain behavior of skin: Secondary | ICD-10-CM | POA: Diagnosis not present

## 2023-02-10 DIAGNOSIS — D224 Melanocytic nevi of scalp and neck: Secondary | ICD-10-CM | POA: Diagnosis not present

## 2023-02-10 DIAGNOSIS — C44319 Basal cell carcinoma of skin of other parts of face: Secondary | ICD-10-CM | POA: Diagnosis not present

## 2023-02-10 DIAGNOSIS — C44619 Basal cell carcinoma of skin of left upper limb, including shoulder: Secondary | ICD-10-CM | POA: Diagnosis not present

## 2023-02-10 DIAGNOSIS — L821 Other seborrheic keratosis: Secondary | ICD-10-CM | POA: Diagnosis not present

## 2023-02-10 DIAGNOSIS — D225 Melanocytic nevi of trunk: Secondary | ICD-10-CM | POA: Diagnosis not present

## 2023-02-10 DIAGNOSIS — Z85828 Personal history of other malignant neoplasm of skin: Secondary | ICD-10-CM | POA: Diagnosis not present

## 2023-02-10 DIAGNOSIS — D2262 Melanocytic nevi of left upper limb, including shoulder: Secondary | ICD-10-CM | POA: Diagnosis not present

## 2023-02-10 DIAGNOSIS — D2261 Melanocytic nevi of right upper limb, including shoulder: Secondary | ICD-10-CM | POA: Diagnosis not present

## 2023-02-15 ENCOUNTER — Other Ambulatory Visit: Payer: Self-pay | Admitting: Family Medicine

## 2023-02-19 DIAGNOSIS — M7711 Lateral epicondylitis, right elbow: Secondary | ICD-10-CM | POA: Diagnosis not present

## 2023-03-24 ENCOUNTER — Encounter: Payer: Self-pay | Admitting: Internal Medicine

## 2023-03-24 ENCOUNTER — Ambulatory Visit: Payer: Medicare HMO | Admitting: Internal Medicine

## 2023-03-24 VITALS — BP 104/72 | HR 89 | Ht 71.0 in | Wt 193.0 lb

## 2023-03-24 DIAGNOSIS — R194 Change in bowel habit: Secondary | ICD-10-CM | POA: Diagnosis not present

## 2023-03-24 DIAGNOSIS — K219 Gastro-esophageal reflux disease without esophagitis: Secondary | ICD-10-CM

## 2023-03-24 DIAGNOSIS — C44319 Basal cell carcinoma of skin of other parts of face: Secondary | ICD-10-CM | POA: Diagnosis not present

## 2023-03-24 MED ORDER — RIFAXIMIN 550 MG PO TABS: 550.0000 mg | ORAL_TABLET | Freq: Three times a day (TID) | ORAL | 0 refills | Status: DC

## 2023-03-24 NOTE — Patient Instructions (Signed)
We have sent the following medications to your pharmacy for you to pick up at your convenience: xifaxan x 14 days.   You can take over the counter Citracel daily.   You can also take Imodium as needed over the counter for diarrhea.   The [place] providers would like to encourage you to use [facility] to communicate with providers for non-urgent requests or questions.  Due to long hold times on the telephone, sending your provider a message by [facility] may be a faster and more efficient way to get a response.  Please allow 48 business hours for a response.  Please remember that this is for non-urgent requests.

## 2023-03-24 NOTE — Progress Notes (Signed)
HISTORY OF PRESENT ILLNESS:  Gerald [name] is a 67 y.o. male with a history of adenomatous colon polyps who was evaluated August 26, 2022 regarding change in bowel habits.  See that dictation.  He tells me that he took probiotic align for 2 months.  Did not help.  He does use low-dose Imodium periodically, which does help.  He states he tried Citrucel but did not feel this helped.  Currently he describes 3-4 bowel movements in the morning.  May have 1 thereafter.  25% of the time diarrhea.  About half the time loose.  Formed about 25% of the time.  Some urgency.  Increased intestinal gas.  Concern.  Last colonoscopy March 2021.  REVIEW OF SYSTEMS:  All non-GI ROS negative. Past Medical History:  Diagnosis Date   Complication of anesthesia    Deviated septum 09/13/14   severe   Duodenal ulcer    Esophageal stricture    GERD (gastroesophageal reflux disease)    Hyperlipemia    no meds    Leg length discrepancy    PONV (postoperative nausea and vomiting)    PUD (peptic ulcer disease)    Smoker     Past Surgical History:  Procedure Laterality Date   BIOPSY SHOULDER Bilateral 03/25/2018   superficial basal cell carcinoma   COLONOSCOPY  2007   EYE SURGERY Right    retinal tear   KNEE ARTHROSCOPY     POLYPECTOMY     UPPER GASTROINTESTINAL ENDOSCOPY  2015    Social History [name]  reports that he quit smoking about 6 years ago. His smoking use included cigarettes. He smoked an average of .25 packs per day. He has never used smokeless tobacco. He reports current alcohol use. He reports that he does not use drugs.  family history includes Arthritis in his mother; Colon polyps in his mother; Stroke in his father.  No Known Allergies     PHYSICAL EXAMINATION: Vital signs: BP 104/72   Pulse 89   Ht 5\' 11"  (1.803 m)   Wt 193 lb (87.5 kg)   BMI 26.92 kg/m   Constitutional: generally well-appearing, no acute distress Psychiatric: alert and oriented x3,  cooperative Eyes: extraocular movements intact, anicteric, conjunctiva pink Mouth: oral pharynx moist, no lesions Neck: supple no lymphadenopathy Cardiovascular: heart regular rate and rhythm, no murmur Lungs: clear to auscultation bilaterally Abdomen: soft, nontender, nondistended, no obvious ascites, no peritoneal signs, normal bowel sounds, no organomegaly Rectal: Omitted Extremities: no clubbing, cyanosis, or lower extremity edema bilaterally Skin: no lesions on visible extremities Neuro: No focal deficits.  Cranial nerves intact  ASSESSMENT:  1.  Change in bowel habits as described.  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  Question microscopic colitis. 2.  History of adenomatous polyps.  Last colonoscopy March 2021 3.  GERD.  Asymptomatic on Nexium   PLAN:  1.  Trial of Xifaxan 550 mg p.o. 3 times daily x 2 weeks 2.  Resume Citrucel 3.  Imodium as needed 4.  Office follow-up in 2 months.  If he is continuing to have problems at that time, we should consider colonoscopy with biopsies to rule out microscopic colitis A total time of 30 minutes was spent preparing to see the patient, obtaining interval history, performing medically appropriate physical examination, counseling and educating the patient regarding above listed issues, ordering medication, arranging follow-up, and documenting clinical information in the health record

## 2023-03-25 ENCOUNTER — Telehealth: Payer: Self-pay

## 2023-03-25 NOTE — Telephone Encounter (Signed)
*  GASTRO   PA request received via CMM for Xifaxan 550MG  tablets  PA has been submitted to [facility] via CMM and is pending additional questions/determination  *history of IBS *Immodium helps some  Key: [id number]

## 2023-03-25 NOTE — Telephone Encounter (Signed)
PA approved from 03/25/2023-04/08/2023

## 2023-03-26 ENCOUNTER — Telehealth: Payer: Self-pay | Admitting: Internal Medicine

## 2023-03-26 MED ORDER — METRONIDAZOLE 250 MG PO TABS: 250.0000 mg | ORAL_TABLET | Freq: Three times a day (TID) | ORAL | 0 refills | Status: DC

## 2023-03-26 NOTE — Telephone Encounter (Signed)
Left message for pt on his voicemail. Prescription sent to pharmacy.

## 2023-03-26 NOTE — Telephone Encounter (Signed)
Metronidazole 250 mg p.o. 3 times daily x 2 weeks.  Do not consume alcohol while taking this medication.

## 2023-03-26 NOTE — Telephone Encounter (Signed)
Dr. Marina Goodell see message below and advise.

## 2023-03-26 NOTE — Telephone Encounter (Signed)
PT was prescribed Xifaxin and it is too costly. He is requesting a callback to discuss alternatives.

## 2023-04-21 ENCOUNTER — Other Ambulatory Visit: Payer: Self-pay | Admitting: Family Medicine

## 2023-05-05 DIAGNOSIS — M25521 Pain in right elbow: Secondary | ICD-10-CM | POA: Diagnosis not present

## 2023-05-22 ENCOUNTER — Encounter: Payer: Self-pay | Admitting: Family Medicine

## 2023-05-22 ENCOUNTER — Ambulatory Visit (INDEPENDENT_AMBULATORY_CARE_PROVIDER_SITE_OTHER): Payer: Medicare HMO | Admitting: Family Medicine

## 2023-05-22 VITALS — BP 122/84 | HR 107 | Temp 98.0°F | Resp 18 | Wt 194.0 lb

## 2023-05-22 DIAGNOSIS — R252 Cramp and spasm: Secondary | ICD-10-CM | POA: Diagnosis not present

## 2023-05-22 DIAGNOSIS — I4891 Unspecified atrial fibrillation: Secondary | ICD-10-CM

## 2023-05-22 MED ORDER — METOPROLOL SUCCINATE ER 50 MG PO TB24
50.0000 mg | ORAL_TABLET | Freq: Every day | ORAL | 0 refills | Status: AC
Start: 2023-05-22 — End: ?

## 2023-05-22 MED ORDER — RIVAROXABAN 20 MG PO TABS
20.0000 mg | ORAL_TABLET | Freq: Every day | ORAL | 0 refills | Status: DC
Start: 2023-05-22 — End: 2023-05-26

## 2023-05-22 NOTE — Patient Instructions (Signed)
Atrial Fibrillation Atrial fibrillation (AFib) is a type of irregular or rapid heartbeat (arrhythmia). In AFib, the top part of the heart (atria) beats in an irregular pattern. This makes the heart unable to pump blood normally and effectively. The goal of treatment is to prevent blood clots from forming, control your heart rate, or restore your heartbeat to a normal rhythm. If this condition is not treated, it can cause serious problems, such as a weakened heart muscle (cardiomyopathy) or a stroke. What are the causes? This condition is often caused by medical conditions that damage the heart's electrical system. These include: High blood pressure (hypertension). This is the most common cause. Certain heart problems or conditions, such as heart failure, coronary artery disease, heart valve problems, or heart surgery. Diabetes. Overactive thyroid (hyperthyroidism). Chronic kidney disease. Certain lung conditions, such as emphysema, pneumonia, or COPD. Obstructive sleep apnea. In some cases, the cause of this condition is not known. What increases the risk? This condition is more likely to develop in: Older adults. Athletes who do endurance exercise. People who have a family history of AFib. Males. People who are Caucasian. People who are obese. People who smoke or misuse alcohol. What are the signs or symptoms? Symptoms of this condition include: Fast or irregular heartbeats (palpitations). Discomfort or pain in your chest. Shortness of breath. Sudden light-headedness or weakness. Tiring easily during exercise or activity. Syncope (fainting). Sweating. In some cases, there are no symptoms. How is this diagnosed? Your health care provider may detect AFib when taking your pulse. If detected, this condition may be diagnosed with: An electrocardiogram (ECG) to check electrical signals of the heart. An ambulatory cardiac monitor to record your heart's activity for a few days. A  transthoracic echocardiogram (TTE) to create pictures of your heart. A transesophageal echocardiogram (TEE) to create even clearer pictures of your heart. A stress test to check your blood supply while you exercise. Imaging tests, such as a CT scan or chest X-ray. Blood tests. How is this treated? Treatment depends on underlying conditions and how you feel when you get AFib. This condition may be treated with: Medicines to prevent blood clots or to treat heart rate or heart rhythm problems. Electrical cardioversion to reset the heart's rhythm. A pacemaker to correct abnormal heart rhythm. Ablation to remove the heart tissue that sends abnormal signals. Left atrial appendage closure to seal the area where blood clots can form. In some cases, underlying conditions will be treated. Follow these instructions at home: Medicines Take over-the counter and prescription medicines only as told by your provider. Do not take any new medicines without talking to your provider. If you are taking blood thinners: Talk with your provider before taking aspirin or NSAIDs. These medicines can raise your risk of bleeding. Take your medicines as told. Take them at the same time each day. Do not do things that could hurt or bruise you. Be careful to avoid falls. Wear an alert bracelet or carry a card that says that you take blood thinners. Lifestyle Do not use any products that contain nicotine or tobacco. These products include cigarettes, chewing tobacco, and vaping devices, such as e-cigarettes. If you need help quitting, ask your provider. Eat heart-healthy foods. Talk with a food expert (dietitian) to make an eating plan that is right for you. Exercise regularly as told by your provider. Do not drink alcohol. Lose weight if you are overweight. General instructions If you have obstructive sleep apnea, manage your condition as told by your provider.   Do not use diet pills unless your provider approves. Diet  pills can make heart problems worse. Keep all follow-up visits. Your provider will want to check your heart rate and rhythm regularly. Contact a health care provider if: You notice a change in the rate, rhythm, or strength of your heartbeat. You are taking a blood thinner and you notice more bruising. You tire more easily when you exercise or do heavy work. You have a sudden change in weight. Get help right away if:  You have chest pain. You have trouble breathing. You have side effects of blood thinners, such as blood in your vomit, poop (stool), or pee (urine), or bleeding that does not stop. You have any symptoms of a stroke. "BE FAST" is an easy way to remember the main warning signs of a stroke: B - Balance. Signs are dizziness, sudden trouble walking, or loss of balance. E - Eyes. Signs are trouble seeing or a sudden change in vision. F - Face. Signs are sudden weakness or numbness of the face, or the face or eyelid drooping on one side. A - Arms. Signs are weakness or numbness in an arm. This happens suddenly and usually on one side of the body. S - Speech.Signs are sudden trouble speaking, slurred speech, or trouble understanding what people say. T - Time. Time to call emergency services. Write down what time symptoms started. Other signs of a stroke, such as: A sudden, severe headache with no known cause. Nausea or vomiting. Seizure. These symptoms may be an emergency. Get help right away. Call 911. Do not wait to see if the symptoms will go away. Do not drive yourself to the hospital. This information is not intended to replace advice given to you by your health care provider. Make sure you discuss any questions you have with your health care provider. Document Revised: 07/10/2022 Document Reviewed: 07/10/2022 Elsevier Patient Education  2024 [employer] Inc.  

## 2023-05-22 NOTE — Progress Notes (Signed)
   Subjective:    Patient ID: Gerald [name], male    DOB: [birth year], 67 y.o.   MRN: [id number]                   HPI Is here today to discuss leg cramps.  He has had leg cramping mainly in the calf at night for the last 4 weeks.  He keeps himself quite busy with golf and has been playing this regularly.  He keeps himself very well-hydrated while playing golf.  He has stopped taking the Lipitor several months ago because he did note that in the past it did cause some muscle aches and pains.  He is not complaining of chest pain, shortness of breath, palpitations, dyspnea on exertion.   Review of Systems     Objective:    Physical Exam Alert and in no distress.  Cardiac exam shows an irregular tachycardia.  Lungs are clear to auscultation.  Negative Homans' sign.  Pulses in his extremities are normal.  Skin appears normal. EKG right bundle as well as atrial fib with a rate of 117.       Assessment & Plan:   Atrial fibrillation, unspecified type (HCC) - Plan: rivaroxaban (XARELTO) 20 MG TABS tablet, EKG 12-Lead  Leg cramps - Plan: CBC with Differential/Platelet, Comprehensive metabolic panel, Magnesium I discussed the atrial fibrillation with him and care of this including starting Xarelto 2 reduce the risk of stroke and will place him on a beta-blocker.  I called the atrial fibs clinic and set up an appointment for him to be seen tomorrow at 2:30.  The diagnosis of this and potential therapies for this.  Also cautioned him to not use any NSAID while on the Xarelto.  Information given in the AVS. I then discussed the cramping and at this point other than keeping himself hydrated and other intervention at this point is warranted. Over the 50 minutes spent discussing all these issues and coordination of care.

## 2023-05-23 ENCOUNTER — Ambulatory Visit (HOSPITAL_COMMUNITY): Payer: Medicare HMO | Admitting: Internal Medicine

## 2023-05-23 LAB — COMPREHENSIVE METABOLIC PANEL
ALT: 28 IU/L (ref 0–44)
AST: 25 IU/L (ref 0–40)
Albumin: 4.5 g/dL (ref 3.9–4.9)
Alkaline Phosphatase: 66 IU/L (ref 44–121)
BUN/Creatinine Ratio: 16 (ref 10–24)
BUN: 15 mg/dL (ref 8–27)
Bilirubin Total: 0.5 mg/dL (ref 0.0–1.2)
CO2: 24 mmol/L (ref 20–29)
Calcium: 9.7 mg/dL (ref 8.6–10.2)
Chloride: 103 mmol/L (ref 96–106)
Creatinine, Ser: 0.95 mg/dL (ref 0.76–1.27)
Globulin, Total: 2.2 g/dL (ref 1.5–4.5)
Glucose: 108 mg/dL — ABNORMAL HIGH (ref 70–99)
Potassium: 5 mmol/L (ref 3.5–5.2)
Sodium: 140 mmol/L (ref 134–144)
Total Protein: 6.7 g/dL (ref 6.0–8.5)
eGFR: 88 mL/min/{1.73_m2} (ref >59)

## 2023-05-23 LAB — CBC WITH DIFFERENTIAL/PLATELET
Basophils Absolute: 0 10*3/uL (ref 0.0–0.2)
Basos: 0 %
EOS (ABSOLUTE): 0.1 10*3/uL (ref 0.0–0.4)
Eos: 2 %
Hematocrit: 41.6 % (ref 37.5–51.0)
Hemoglobin: 14.5 g/dL (ref 13.0–17.7)
Immature Grans (Abs): 0 10*3/uL (ref 0.0–0.1)
Immature Granulocytes: 0 %
Lymphocytes Absolute: 2.2 10*3/uL (ref 0.7–3.1)
Lymphs: 30 %
MCH: 34.3 pg — ABNORMAL HIGH (ref 26.6–33.0)
MCHC: 34.9 g/dL (ref 31.5–35.7)
MCV: 98 fL — ABNORMAL HIGH (ref 79–97)
Monocytes Absolute: 0.6 10*3/uL (ref 0.1–0.9)
Monocytes: 8 %
Neutrophils Absolute: 4.4 10*3/uL (ref 1.4–7.0)
Neutrophils: 60 %
Platelets: 257 10*3/uL (ref 150–450)
RBC: 4.23 x10E6/uL (ref 4.14–5.80)
RDW: 13.5 % (ref 11.6–15.4)
WBC: 7.4 10*3/uL (ref 3.4–10.8)

## 2023-05-23 LAB — MAGNESIUM: Magnesium: 2 mg/dL (ref 1.6–2.3)

## 2023-05-26 ENCOUNTER — Encounter (HOSPITAL_COMMUNITY): Payer: Self-pay | Admitting: Physician Assistant

## 2023-05-26 ENCOUNTER — Ambulatory Visit (HOSPITAL_COMMUNITY)
Admission: RE | Admit: 2023-05-26 | Discharge: 2023-05-26 | Disposition: A | Discharge status: Home / Self Care | Payer: Medicare HMO | Source: Ambulatory Visit | Attending: Internal Medicine | Admitting: Internal Medicine

## 2023-05-26 VITALS — BP 132/76 | HR 147 | Ht 71.0 in | Wt 190.4 lb

## 2023-05-26 DIAGNOSIS — I4819 Other persistent atrial fibrillation: Secondary | ICD-10-CM

## 2023-05-26 DIAGNOSIS — E785 Hyperlipidemia, unspecified: Secondary | ICD-10-CM | POA: Insufficient documentation

## 2023-05-26 DIAGNOSIS — Z7901 Long term (current) use of anticoagulants: Secondary | ICD-10-CM | POA: Insufficient documentation

## 2023-05-26 MED ORDER — RIVAROXABAN 20 MG PO TABS
20.0000 mg | ORAL_TABLET | Freq: Every day | ORAL | 4 refills | Status: DC
Start: 2023-05-26 — End: 2023-09-18

## 2023-05-26 MED ORDER — METOPROLOL SUCCINATE ER 50 MG PO TB24: 50.0000 mg | ORAL_TABLET | Freq: Every day | ORAL | 4 refills | Status: DC

## 2023-05-26 NOTE — Patient Instructions (Signed)
Cardioversion scheduled for: Monday, August 19th   - Arrive at the [facility] and go to admitting at 730am   - Do not eat or drink anything after midnight the night prior to your procedure.   - Take all your morning medication (except diabetic medications) with a sip of water prior to arrival.  - You will not be able to drive home after your procedure.    - Do NOT miss any doses of your blood thinner - if you should miss a dose please notify our office immediately.   - If you feel as if you go back into normal rhythm prior to scheduled cardioversion, please notify our office immediately.   If your procedure is canceled in the cardioversion suite you will be charged a cancellation fee.

## 2023-05-26 NOTE — Progress Notes (Signed)
Primary Care Physician: Ronnald Nian, MD Primary Cardiologist: None Electrophysiologist: None  Referring Physician: Dr [name] is a 67 y.o. male with a history of esophageal stricture, HLD, PUD, atrial fibrillation who presents for consultation in the [facility]. The patient was initially diagnosed with atrial fibrillation incidentally at his PCP office on 05/22/23. Patient was unaware of his arrhythmia. He was started on Xarelto for a CHADS2VASC score of 1.  Today, patient remains in afib with elevated rates. He is still asymptomatic. He does drink alcohol occasionally while golfing. He admits to snoring but denies daytime somnolence or witnessed apnea. There were no specific triggers that he could identify.   Today, he denies symptoms of palpitations, chest pain, shortness of breath, orthopnea, PND, lower extremity edema, dizziness, presyncope, syncope, daytime somnolence, bleeding, or neurologic sequela. The patient is tolerating medications without difficulties and is otherwise without complaint today.    Atrial Fibrillation Risk Factors:  he does not have symptoms or diagnosis of sleep apnea. he does not have a history of rheumatic fever. he does have a history of alcohol use. The patient does not have a history of early familial atrial fibrillation or other arrhythmias.  Atrial Fibrillation Management history:  Previous antiarrhythmic drugs: none Previous cardioversions: none Previous ablations: none Anticoagulation history: Xarelto  ROS- All systems are reviewed and negative except as per the HPI above.   Physical Exam: BP 136/86   Pulse (!) 147   Ht 5\' 11"  (1.803 m)   Wt 86.4 kg   BMI 26.56 kg/m   GEN: Well nourished, well developed in no acute distress NECK: No JVD; No carotid bruits CARDIAC: Irregularly irregular rate and rhythm, no murmurs, rubs, gallops RESPIRATORY:  Clear to auscultation without rales,  wheezing or rhonchi  ABDOMEN: Soft, non-tender, non-distended EXTREMITIES:  No edema; No deformity   Wt Readings from Last 3 Encounters:  05/26/23 86.4 kg  05/22/23 88 kg  03/24/23 87.5 kg     EKG today demonstrates  Afib with RVR, inc RBBB Vent. rate 147 BPM PR interval * ms QRS duration 112 ms QT/QTcB 314/491 ms    CHA2DS2-VASc Score = 1  The patient's score is based upon: CHF History: 0 HTN History: 0 Diabetes History: 0 Stroke History: 0 Vascular Disease History: 0 Age Score: 1 Gender Score: 0       ASSESSMENT AND PLAN: Persistent Atrial Fibrillation (ICD10:  I48.19) The patient's CHA2DS2-VASc score is 1, indicating a 0.6% annual risk of stroke.   Patient remains in afib with uncontrolled rates. He just started Toprol on 7/19. We discussed smart device for home monitoring and he is interested in [employer] mobile. He will monitor his heart rates over the next several days and if they remain elevated we will up titrate his BB. We discussed rhythm control options today. Will arrange for DCCV after 3 weeks of uninterrupted anticoagulation. Long term, he would prefer medications to ablation. Continue Toprol 50 mg daily Continue Xarelto 20 mg daily Will check echocardiogram once he is back in SR.   Follow up in the AF clinic post DCCV.    Informed Consent   Shared Decision Making/Informed Consent The risks (stroke, cardiac arrhythmias rarely resulting in the need for a temporary or permanent pacemaker, skin irritation or burns and complications associated with conscious sedation including aspiration, arrhythmia, respiratory failure and death), benefits (restoration of normal sinus rhythm) and alternatives of a direct current cardioversion were explained in detail to  Gerald [name] and he agrees to proceed.       Gerald Loa PA-C Afib Clinic [facility] [address] [place], [place] [address] [phone]

## 2023-05-27 DIAGNOSIS — R69 Illness, unspecified: Secondary | ICD-10-CM | POA: Diagnosis not present

## 2023-05-29 ENCOUNTER — Other Ambulatory Visit: Payer: Self-pay | Admitting: Family Medicine

## 2023-06-04 DIAGNOSIS — R69 Illness, unspecified: Secondary | ICD-10-CM | POA: Diagnosis not present

## 2023-06-09 DIAGNOSIS — Z961 Presence of intraocular lens: Secondary | ICD-10-CM | POA: Diagnosis not present

## 2023-06-09 DIAGNOSIS — H18513 Endothelial corneal dystrophy, bilateral: Secondary | ICD-10-CM | POA: Diagnosis not present

## 2023-06-09 DIAGNOSIS — H25812 Combined forms of age-related cataract, left eye: Secondary | ICD-10-CM | POA: Diagnosis not present

## 2023-06-09 DIAGNOSIS — H43811 Vitreous degeneration, right eye: Secondary | ICD-10-CM | POA: Diagnosis not present

## 2023-06-10 DIAGNOSIS — H524 Presbyopia: Secondary | ICD-10-CM | POA: Diagnosis not present

## 2023-06-10 DIAGNOSIS — H52222 Regular astigmatism, left eye: Secondary | ICD-10-CM | POA: Diagnosis not present

## 2023-06-16 ENCOUNTER — Ambulatory Visit (HOSPITAL_COMMUNITY)
Admission: RE | Admit: 2023-06-16 | Discharge: 2023-06-16 | Disposition: A | Discharge status: Home / Self Care | Payer: Medicare HMO | Source: Ambulatory Visit | Attending: Physician Assistant | Admitting: Physician Assistant

## 2023-06-16 DIAGNOSIS — I4819 Other persistent atrial fibrillation: Secondary | ICD-10-CM | POA: Diagnosis not present

## 2023-06-16 LAB — BASIC METABOLIC PANEL
Anion gap: 11 (ref 5–15)
BUN: 23 mg/dL (ref 8–23)
CO2: 25 mmol/L (ref 22–32)
Calcium: 9.5 mg/dL (ref 8.9–10.3)
Chloride: 102 mmol/L (ref 98–111)
Creatinine, Ser: 1.05 mg/dL (ref 0.61–1.24)
GFR, Estimated: >60 mL/min (ref >60)
Glucose, Bld: 114 mg/dL — ABNORMAL HIGH (ref 70–99)
Potassium: 5.1 mmol/L (ref 3.5–5.1)
Sodium: 138 mmol/L (ref 135–145)

## 2023-06-16 LAB — CBC
HCT: 46.6 % (ref 39.0–52.0)
Hemoglobin: 16.1 g/dL (ref 13.0–17.0)
MCH: 34 pg (ref 26.0–34.0)
MCHC: 34.5 g/dL (ref 30.0–36.0)
MCV: 98.3 fL (ref 80.0–100.0)
Platelets: 266 10*3/uL (ref 150–400)
RBC: 4.74 MIL/uL (ref 4.22–5.81)
RDW: 13.9 % (ref 11.5–15.5)
WBC: 6.5 10*3/uL (ref 4.0–10.5)
nRBC: 0 % (ref 0.0–0.2)

## 2023-06-16 NOTE — Patient Instructions (Addendum)
Cardioversion scheduled for: August 22nd 2024   - Arrive at the [facility] and go to admitting at 12:30pm   - Do not eat or drink anything after midnight the night prior to your procedure.   - Take all your morning medication (except diabetic medications) with a sip of water prior to arrival.  - You will not be able to drive home after your procedure.    - Do NOT miss any doses of your blood thinner - if you should miss a dose please notify our office immediately.   - If you feel as if you go back into normal rhythm prior to scheduled cardioversion, please notify our office immediately.   If your procedure is canceled in the cardioversion suite you will be charged a cancellation fee.

## 2023-06-18 DIAGNOSIS — R69 Illness, unspecified: Secondary | ICD-10-CM | POA: Diagnosis not present

## 2023-06-19 DIAGNOSIS — R69 Illness, unspecified: Secondary | ICD-10-CM | POA: Diagnosis not present

## 2023-06-22 DIAGNOSIS — R69 Illness, unspecified: Secondary | ICD-10-CM | POA: Diagnosis not present

## 2023-06-25 NOTE — Progress Notes (Signed)
Unable to reach patient about procedure, but was able to leave a detailed message. Stated that the patient needed to arrive at the hospital at 1230, remain NPO after 0000, needs to have a ride home and a responsible adult to stay with them for 24 hours after the procedure. Instructed the patient to call back if they had any questions.

## 2023-06-26 ENCOUNTER — Other Ambulatory Visit: Payer: Self-pay

## 2023-06-26 ENCOUNTER — Ambulatory Visit (HOSPITAL_COMMUNITY)
Admission: RE | Admit: 2023-06-26 | Discharge: 2023-06-26 | Disposition: A | Discharge status: Home / Self Care | Payer: Medicare HMO | Attending: Internal Medicine | Admitting: Internal Medicine

## 2023-06-26 ENCOUNTER — Other Ambulatory Visit: Payer: Self-pay | Admitting: Internal Medicine

## 2023-06-26 ENCOUNTER — Ambulatory Visit (HOSPITAL_BASED_OUTPATIENT_CLINIC_OR_DEPARTMENT_OTHER): Payer: Medicare HMO | Admitting: Anesthesiology

## 2023-06-26 ENCOUNTER — Telehealth: Payer: Self-pay

## 2023-06-26 ENCOUNTER — Encounter (HOSPITAL_COMMUNITY): Payer: Self-pay | Admitting: Internal Medicine

## 2023-06-26 ENCOUNTER — Encounter (HOSPITAL_COMMUNITY): Payer: Self-pay

## 2023-06-26 ENCOUNTER — Encounter (HOSPITAL_COMMUNITY)
Admission: RE | Disposition: A | Discharge status: Home / Self Care | Payer: Self-pay | Source: Home / Self Care | Attending: Internal Medicine

## 2023-06-26 ENCOUNTER — Ambulatory Visit (HOSPITAL_COMMUNITY): Payer: Medicare HMO | Admitting: Anesthesiology

## 2023-06-26 DIAGNOSIS — I700 Atherosclerosis of aorta: Secondary | ICD-10-CM | POA: Diagnosis not present

## 2023-06-26 DIAGNOSIS — Z87891 Personal history of nicotine dependence: Secondary | ICD-10-CM

## 2023-06-26 DIAGNOSIS — I4891 Unspecified atrial fibrillation: Secondary | ICD-10-CM

## 2023-06-26 DIAGNOSIS — Z7901 Long term (current) use of anticoagulants: Secondary | ICD-10-CM | POA: Diagnosis not present

## 2023-06-26 DIAGNOSIS — I4819 Other persistent atrial fibrillation: Secondary | ICD-10-CM

## 2023-06-26 DIAGNOSIS — D6859 Other primary thrombophilia: Secondary | ICD-10-CM | POA: Diagnosis not present

## 2023-06-26 DIAGNOSIS — I10 Essential (primary) hypertension: Secondary | ICD-10-CM

## 2023-06-26 DIAGNOSIS — I4811 Longstanding persistent atrial fibrillation: Secondary | ICD-10-CM | POA: Diagnosis not present

## 2023-06-26 HISTORY — PX: CARDIOVERSION: SHX1299

## 2023-06-26 SURGERY — CARDIOVERSION
Anesthesia: General

## 2023-06-26 MED ORDER — SODIUM CHLORIDE 0.9 % IV SOLN: INTRAVENOUS | Status: DC | PRN

## 2023-06-26 MED ORDER — PROPOFOL 10 MG/ML IV BOLUS
INTRAVENOUS | Status: DC | PRN
  Administered 2023-06-26: 20 mg via INTRAVENOUS
  Administered 2023-06-26: 60 mg via INTRAVENOUS

## 2023-06-26 MED ORDER — LIDOCAINE 2% (20 MG/ML) 5 ML SYRINGE
INTRAMUSCULAR | Status: DC | PRN
  Administered 2023-06-26: 60 mg via INTRAVENOUS

## 2023-06-26 SURGICAL SUPPLY — 1 items: ELECT DEFIB PAD ADLT CADENCE (PAD) ×2 IMPLANT

## 2023-06-26 NOTE — Progress Notes (Unsigned)
MD request pt have a lexiscan NMST for AF.

## 2023-06-26 NOTE — CV Procedure (Signed)
    Electrical Cardioversion Procedure Note Gerald [name] [id number] 05-18-1956  Procedure: Electrical Cardioversion Indications:  Atrial Fibrillation  Time Out: Verified patient identification, verified procedure,medications/allergies/relevent history reviewed, required imaging and test results available.  Performed  Procedure Details  The patient was NPO after midnight. Anesthesia was administered at the beside  by Dr.Turk and team.  Cardioversion was done with synchronized biphasic defibrillation with AP pads with 200 Joules.  The patient converted to sinus rhythm for only 30 seconds.  A second cardioverstion was performed at 200 J.  Sinus rhythm lasted only 30 seconds.  The patient tolerated the procedure well.  IMPRESSION:  Cardioversion to sinus rhythm with return to atrial fibrillation.   Gerald Lam, MD FASE [facility] Cardiologist [facility]  [address] [place], [address] [place], [place] [address] [phone]  1:45 PM

## 2023-06-26 NOTE — Transfer of Care (Signed)
Immediate Anesthesia Transfer of Care Note  Patient: Gerald [name]  Procedure(s) Performed: CARDIOVERSION  Patient Location: PACU  Anesthesia Type:General  Level of Consciousness: drowsy  Airway & Oxygen Therapy: Patient Spontanous Breathing and Patient connected to face mask oxygen  Post-op Assessment: Report given to RN and Post -op Vital signs reviewed and stable  Post vital signs: Reviewed and stable  Last Vitals:  Vitals Value Taken Time  BP    Temp    Pulse    Resp    SpO2      Last Pain:  Vitals:   06/26/23 1256  TempSrc: Temporal  PainSc: 0-No pain         Complications: No notable events documented.

## 2023-06-26 NOTE — Anesthesia Preprocedure Evaluation (Addendum)
Anesthesia Evaluation  Patient identified by MRN, date of birth, ID band Patient awake    Reviewed: Allergy & Precautions, NPO status , Patient's Chart, lab work & pertinent test results, reviewed documented beta blocker date and time   History of Anesthesia Complications (+) PONV and history of anesthetic complications  Airway Mallampati: II  TM Distance: >3 FB Neck ROM: Full    Dental  (+) Teeth Intact, Dental Advisory Given   Pulmonary former smoker   Pulmonary exam normal breath sounds clear to auscultation       Cardiovascular hypertension, Pt. on home beta blockers + dysrhythmias Atrial Fibrillation  Rhythm:Irregular Rate:Abnormal     Neuro/Psych negative neurological ROS     GI/Hepatic Neg liver ROS, PUD,GERD  Medicated,,  Endo/Other  negative endocrine ROS    Renal/GU negative Renal ROS     Musculoskeletal negative musculoskeletal ROS (+)    Abdominal   Peds  Hematology  (+) Blood dyscrasia (Xarelto)   Anesthesia Other Findings   Reproductive/Obstetrics                             Anesthesia Physical Anesthesia Plan  ASA: 3  Anesthesia Plan: General   Post-op Pain Management: Minimal or no pain anticipated   Induction: Intravenous  PONV Risk Score and Plan: 3 and TIVA  Airway Management Planned: Mask  Additional Equipment:   Intra-op Plan:   Post-operative Plan:   Informed Consent: I have reviewed the patients History and Physical, chart, labs and discussed the procedure including the risks, benefits and alternatives for the proposed anesthesia with the patient or authorized representative who has indicated his/her understanding and acceptance.     Dental advisory given  Plan Discussed with: CRNA  Anesthesia Plan Comments:        Anesthesia Quick Evaluation

## 2023-06-26 NOTE — H&P (Addendum)
Cardiology [place]ura History and Physical :   Patient ID: Gerald [name]; MRN: [id number]; DOB: [birth year]   Admission date: 06/26/2023  Primary Care Provider: Ronnald Nian, MD Primary Cardiologist: None  Chief Complaint:  AF RVR  Patient Profile:   Gerald [name] is a 67 y.o. male with a history of esophageal structure, PUD, and AF who presents for DCCV.  History of Present Illness:   Mr. Gerald [name] is feeling fine.   He is a former Film/video editor and an avid Teacher, English as a foreign language. At PCP visit found to be in AF.  Rare has been difficult to control despite rate control medications.  Has had no chest pain, chest pressure, chest tightness, chest stinging   No shortness of breath, DOE .  No PND or orthopnea.  No weight gain, leg swelling , or abdominal swelling.  No syncope or near syncope . Notes  no palpitations or funny heart beats even when in AF.   Allergies:    Allergies  Allergen Reactions   Perna Canaliculata (Green-Lipped Mussel) Nausea And Vomiting    Social History:   Social History   Socioeconomic History   Marital status: Married    Spouse name: Not on file   Number of children: Not on file   Years of education: Not on file   Highest education level: Not on file  Occupational History   Occupation: Agricultural consultant  Tobacco Use   Smoking status: Former    Current packs/day: 0.00    Types: Cigarettes    Quit date: 06/02/2016    Years since quitting: 7.0   Smokeless tobacco: Never   Tobacco comments:    Smokes occ.Marland KitchenCounseling sheet given 01-2012   Vaping Use   Vaping status: Never Used  Substance and Sexual Activity   Alcohol use: Yes    Alcohol/week: 0.0 standard drinks of alcohol    Comment: occasional   Drug use: No   Sexual activity: Yes  Other Topics Concern   Not on file  Social History Narrative   Daily caffeine   Social Determinants of Health   Financial Resource Strain: Not on file  Food Insecurity: Not on file  Transportation  Needs: Not on file  Physical Activity: Not on file  Stress: Not on file  Social Connections: Not on file  Intimate Partner Violence: Not on file    Family History:   The patient's family history includes Arthritis in his mother; Colon polyps in his mother; Stroke in his father. There is no history of Colon cancer, Esophageal cancer, Rectal cancer, or Stomach cancer.    ROS:  Please see the history of present illness.   Physical Exam/Data:   Vitals:   06/26/23 1256  BP: (!) 135/106  Pulse: (!) 113  Resp: 12  Temp: 98 F (36.7 C)  TempSrc: Temporal  SpO2: 98%  Weight: 86 kg  Height: 5\' 11"  (1.803 m)   No intake or output data in the 24 hours ending 06/26/23 1316 Filed Weights   06/26/23 1256  Weight: 86 kg   Body mass index is 26.44 kg/m.   Gen: no distress  Neck: No JVD Cardiac: No Rubs or Gallops, no Murmur, IRIR tachycardia, +2 radial pulses Respiratory: Clear to auscultation bilaterally, normal effort, normal  respiratory rate GI: Soft, nontender, non-distended  MS: No  edema;  moves all extremities Integument: Skin feels warm Neuro:  At time of evaluation, alert and oriented to person/place/time/situation  Psych: Normal affect, patient feels well   Tele;  AF RVR    Laboratory Data:  ChemistryNo results for input(s): "NA", "K", "CL", "CO2", "GLUCOSE", "BUN", "CREATININE", "CALCIUM", "GFRNONAA", "GFRAA", "ANIONGAP" in the last 168 hours.  No results for input(s): "PROT", "ALBUMIN", "AST", "ALT", "ALKPHOS", "BILITOT" in the last 168 hours. HematologyNo results for input(s): "WBC", "RBC", "HGB", "HCT", "MCV", "MCH", "MCHC", "RDW", "PLT" in the last 168 hours. Cardiac EnzymesNo results for input(s): "TROPONINI" in the last 168 hours. No results for input(s): "TROPIPOC" in the last 168 hours.  BNPNo results for input(s): "BNP", "PROBNP" in the last 168 hours.  DDimer No results for input(s): "DDIMER" in the last 168 hours.   Assessment and Plan:    Persistent  Atrial Fibrillation,  - Risk factors include age - CHADSVASC=1. -  Continue anticoagulation with Xarelto; Acquired Thrombophilia  Risks and benefits of cardioversion have been discussed with the patient.  These include arhythmia, skin damage, stroke, death.  The patient understands these risks and is willing to proceed.   For questions or updates, please contact [facility] Please consult [email or web address] for contact info under Cardiology/STEMI.   [name], MD FASE [facility] Cardiologist [facility]  [address] [place], [address] [place], [place] [address] [phone]  1:16 PM  ADDENDUM: Returned to AF post procedure STOP BANG 3- sleep study Consent for exercise NM Stress test May start 1c agent at next visit. Asymptomatic.  [name], MD FASE [facility] Cardiologist [facility]  [address] [place], [address] [place], [place] [address] [phone]  2:10 PM

## 2023-06-26 NOTE — Telephone Encounter (Signed)
Called spoke with pt and spouse advised of MD request for pt to have a NMST for AF.  Verbally reviewed instructions also placed on my chart for review. Advised pt to arrive a [address] by 7 am for 7:15 am appointment time.  All questions answered.

## 2023-06-27 ENCOUNTER — Ambulatory Visit (HOSPITAL_COMMUNITY): Payer: Medicare HMO | Attending: Internal Medicine

## 2023-06-27 ENCOUNTER — Other Ambulatory Visit (HOSPITAL_COMMUNITY): Payer: Self-pay | Admitting: Emergency Medicine

## 2023-06-27 ENCOUNTER — Encounter (HOSPITAL_COMMUNITY): Payer: Self-pay | Admitting: Internal Medicine

## 2023-06-27 DIAGNOSIS — I4819 Other persistent atrial fibrillation: Secondary | ICD-10-CM | POA: Insufficient documentation

## 2023-06-27 LAB — MYOCARDIAL PERFUSION IMAGING
LV dias vol: 93 mL (ref 62–150)
LV sys vol: 57 mL
Nuc Stress EF: 38 %
Peak HR: 131 {beats}/min
Rest HR: 100 {beats}/min
Rest Nuclear Isotope Dose: 10.9 mCi
SDS: 0
SRS: 0
SSS: 0
ST Depression (mm): 0 mm
Stress Nuclear Isotope Dose: 32 mCi
TID: 1.02

## 2023-06-27 MED ORDER — TECHNETIUM TC 99M TETROFOSMIN IV KIT
10.9000 | PACK | Freq: Once | INTRAVENOUS | Status: AC | PRN
  Administered 2023-06-27: 10.9 via INTRAVENOUS

## 2023-06-27 MED ORDER — TECHNETIUM TC 99M TETROFOSMIN IV KIT
32.0000 | PACK | Freq: Once | INTRAVENOUS | Status: AC | PRN
  Administered 2023-06-27: 32 via INTRAVENOUS

## 2023-06-27 MED ORDER — REGADENOSON 0.4 MG/5ML IV SOLN
0.4000 mg | Freq: Once | INTRAVENOUS | Status: AC
  Administered 2023-06-27: 0.4 mg via INTRAVENOUS

## 2023-06-27 NOTE — Anesthesia Postprocedure Evaluation (Signed)
Anesthesia Post Note  Patient: Gerald [name]  Procedure(s) Performed: CARDIOVERSION     Patient location during evaluation: Cath Lab Anesthesia Type: General Level of consciousness: awake and alert Pain management: pain level controlled Vital Signs Assessment: post-procedure vital signs reviewed and stable Respiratory status: spontaneous breathing, nonlabored ventilation, respiratory function stable and patient connected to nasal cannula oxygen Cardiovascular status: blood pressure returned to baseline and stable Postop Assessment: no apparent nausea or vomiting Anesthetic complications: no   No notable events documented.  Last Vitals:  Vitals:   06/26/23 1348 06/26/23 1350  BP: 103/86 108/77  Pulse: (!) 106 (!) 105  Resp: 17 18  Temp:  36.8 C  SpO2: 99% 97%    Last Pain:  Vitals:   06/26/23 1350  TempSrc: Temporal  PainSc: 0-No pain                 Collene Schlichter

## 2023-06-30 ENCOUNTER — Encounter: Payer: Self-pay | Admitting: Internal Medicine

## 2023-06-30 ENCOUNTER — Ambulatory Visit (HOSPITAL_COMMUNITY): Payer: Medicare HMO | Attending: Internal Medicine

## 2023-06-30 ENCOUNTER — Ambulatory Visit: Payer: Medicare HMO | Admitting: Internal Medicine

## 2023-06-30 VITALS — BP 118/68 | HR 76 | Ht 72.0 in | Wt 195.0 lb

## 2023-06-30 DIAGNOSIS — I4819 Other persistent atrial fibrillation: Secondary | ICD-10-CM | POA: Diagnosis not present

## 2023-06-30 DIAGNOSIS — Z86010 Personal history of colonic polyps: Secondary | ICD-10-CM

## 2023-06-30 DIAGNOSIS — R197 Diarrhea, unspecified: Secondary | ICD-10-CM

## 2023-06-30 DIAGNOSIS — R194 Change in bowel habit: Secondary | ICD-10-CM | POA: Diagnosis not present

## 2023-06-30 LAB — ECHOCARDIOGRAM COMPLETE
Area-P 1/2: 6.35 cm2
Height: 72 in
S' Lateral: 3.7 cm
Weight: 3120 oz

## 2023-06-30 NOTE — Progress Notes (Signed)
HISTORY OF PRESENT ILLNESS:  Gerald [name] is a 67 y.o. male with past medical history as listed below who presents today for follow-up.  He was last evaluated in the office Mar 24, 2023 regarding change in bowel habits.  The assessment and plan from that visit as follows:  ASSESSMENT: 1.  Change in bowel habits as described.  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  Question microscopic colitis. 2.  History of adenomatous polyps.  Last colonoscopy March 2021 3.  GERD.  Asymptomatic on Nexium     PLAN: 1.  Trial of Xifaxan 550 mg p.o. 3 times daily x 2 weeks 2.  Resume Citrucel 3.  Imodium as needed 4.  Office follow-up in 2 months.  If he is continuing to have problems at that time, we should consider colonoscopy with biopsies to rule out microscopic colitis  Xifaxan was prohibitively expensive.  Thus, he completed a course of metronidazole.  He follows up now as requested.  He tells me that he is doing much better.  No issues with his bowels.  Typically has several formed bowel movements in the morning.  May have an occasional loose bowel.  Will occasionally take Imodium prior to playing golf.  No new features.  No alarm features.  As previously noted his last complete colonoscopy was May 2021   REVIEW OF SYSTEMS:  All non-GI ROS negative. Past Medical History:  Diagnosis Date   A-fib [facility])    Complication of anesthesia    Deviated septum 09/13/2014   severe   Duodenal ulcer    Esophageal stricture    GERD (gastroesophageal reflux disease)    Hyperlipemia    no meds    Leg length discrepancy    PONV (postoperative nausea and vomiting)    PUD (peptic ulcer disease)    Smoker     Past Surgical History:  Procedure Laterality Date   BIOPSY SHOULDER Bilateral 03/25/2018   superficial basal cell carcinoma   CARDIOVERSION N/A 06/26/2023   Procedure: CARDIOVERSION;  Surgeon: Christell Constant, MD;  Location: [facility] INVASIVE CV LAB;  Service: Cardiovascular;  Laterality: N/A;    COLONOSCOPY  2007   EYE SURGERY Right    retinal tear   KNEE ARTHROSCOPY     POLYPECTOMY     UPPER GASTROINTESTINAL ENDOSCOPY  2015    Social History Gerald [name]  reports that he quit smoking about 7 years ago. His smoking use included cigarettes. He has never used smokeless tobacco. He reports current alcohol use. He reports that he does not use drugs.  family history includes Arthritis in his mother; Colon polyps in his mother; Stroke in his father.  Allergies  Allergen Reactions   Perna Canaliculata (Green-Lipped Mussel) Nausea And Vomiting       PHYSICAL EXAMINATION: Vital signs: BP 118/68   Pulse 76   Ht 6' (1.829 m)   Wt 195 lb (88.5 kg)   BMI 26.45 kg/m   Constitutional: generally well-appearing, no acute distress Psychiatric: alert and oriented x3, cooperative Eyes: extraocular movements intact, anicteric, conjunctiva pink Mouth: oral pharynx moist, no lesions Neck: supple no lymphadenopathy Cardiovascular: heart regular rate and rhythm, no murmur Lungs: clear to auscultation bilaterally Abdomen: soft, nontender, nondistended, no obvious ascites, no peritoneal signs, normal bowel sounds, no organomegaly Rectal: Omitted Extremities: no clubbing, cyanosis, or lower extremity edema bilaterally Skin: no lesions on visible extremities Neuro: No focal deficits.  Cranial nerves intact  ASSESSMENT:   1.  Change in bowel habits as described.  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  Given a short course of metronidazole for possible bacterial overgrowth.  His issues have resolved. 2.  History of adenomatous polyps.  Last colonoscopy March 2021 3.  GERD.  Asymptomatic on Nexium     PLAN:   1.  Reassurance 2.  Citrucel 3.  Imodium as needed 4.  Surveillance colonoscopy around March 2026.  Interval follow-up as needed.

## 2023-06-30 NOTE — Patient Instructions (Signed)
Please follow up as needed.  _______________________________________________________  If your blood pressure at your visit was 140/90 or greater, please contact your primary care physician to follow up on this.  _______________________________________________________  If you are age 67 or older, your body mass index should be between 23-30. Your Body mass index is 26.45 kg/m. If this is out of the aforementioned range listed, please consider follow up with your Primary Care Provider.  If you are age 37 or younger, your body mass index should be between 19-25. Your Body mass index is 26.45 kg/m. If this is out of the aformentioned range listed, please consider follow up with your Primary Care Provider.   ________________________________________________________  The [place] [facility] providers would like to encourage you to use [facility] to communicate with providers for non-urgent requests or questions.  Due to long hold times on the telephone, sending your provider a message by [facility] may be a faster and more efficient way to get a response.  Please allow 48 business hours for a response.  Please remember that this is for non-urgent requests.  _______________________________________________________

## 2023-07-03 ENCOUNTER — Encounter (HOSPITAL_COMMUNITY): Payer: Self-pay | Admitting: Physician Assistant

## 2023-07-03 ENCOUNTER — Ambulatory Visit (HOSPITAL_COMMUNITY)
Admission: RE | Admit: 2023-07-03 | Discharge: 2023-07-03 | Disposition: A | Discharge status: Home / Self Care | Payer: Medicare HMO | Source: Ambulatory Visit | Attending: Physician Assistant | Admitting: Physician Assistant

## 2023-07-03 VITALS — BP 128/84 | HR 147 | Ht 72.0 in | Wt 193.2 lb

## 2023-07-03 DIAGNOSIS — I4510 Unspecified right bundle-branch block: Secondary | ICD-10-CM | POA: Diagnosis not present

## 2023-07-03 DIAGNOSIS — D6869 Other thrombophilia: Secondary | ICD-10-CM | POA: Diagnosis not present

## 2023-07-03 DIAGNOSIS — R9431 Abnormal electrocardiogram [ECG] [EKG]: Secondary | ICD-10-CM | POA: Diagnosis not present

## 2023-07-03 DIAGNOSIS — Z7901 Long term (current) use of anticoagulants: Secondary | ICD-10-CM | POA: Diagnosis not present

## 2023-07-03 DIAGNOSIS — I4891 Unspecified atrial fibrillation: Secondary | ICD-10-CM | POA: Diagnosis not present

## 2023-07-03 DIAGNOSIS — I4819 Other persistent atrial fibrillation: Secondary | ICD-10-CM | POA: Insufficient documentation

## 2023-07-03 DIAGNOSIS — I5022 Chronic systolic (congestive) heart failure: Secondary | ICD-10-CM | POA: Diagnosis not present

## 2023-07-03 LAB — TSH: TSH: 2.034 u[IU]/mL (ref 0.350–4.500)

## 2023-07-03 MED ORDER — AMIODARONE HCL 200 MG PO TABS: ORAL_TABLET | ORAL | 0 refills | Status: DC

## 2023-07-03 NOTE — Patient Instructions (Signed)
Start Amiodarone 200mg  - take 1 tablet twice a day for 1 month then reduce to once a day with food

## 2023-07-03 NOTE — Progress Notes (Signed)
Primary Care Physician: Ronnald Nian, MD Primary Cardiologist: None Electrophysiologist: None  Referring Physician: Dr [name] is a 67 y.o. male with a history of esophageal stricture, HLD, PUD, atrial fibrillation who presents for consultation in the [facility]. The patient was initially diagnosed with atrial fibrillation incidentally at his PCP office on 05/22/23. Patient was unaware of his arrhythmia. He was started on Xarelto for a CHADS2VASC score of 1.  Patient underwent DCCV on 06/26/23 but reverted back to afib prior to discharge. He underwent a stress test which was low risk for ischemia and an echo which showed EF 45-50%. He remains asymptomatic despite elevated rates in afib. No symptoms of fluid retention.   Today, he denies symptoms of palpitations, chest pain, shortness of breath, orthopnea, PND, lower extremity edema, dizziness, presyncope, syncope, daytime somnolence, bleeding, or neurologic sequela. The patient is tolerating medications without difficulties and is otherwise without complaint today.    Atrial Fibrillation Risk Factors:  he does not have symptoms or diagnosis of sleep apnea. he does not have a history of rheumatic fever. he does have a history of alcohol use. The patient does not have a history of early familial atrial fibrillation or other arrhythmias.   Atrial Fibrillation Management history:  Previous antiarrhythmic drugs: none Previous cardioversions: 06/26/23 Previous ablations: none Anticoagulation history: Xarelto  ROS- All systems are reviewed and negative except as per the HPI above.   Physical Exam: BP 128/84   Pulse (!) 147   Ht 6' (1.829 m)   Wt 87.6 kg   BMI 26.20 kg/m   GEN: Well nourished, well developed in no acute distress NECK: No JVD; No carotid bruits CARDIAC: Irregularly irregular rate and rhythm, no murmurs, rubs, gallops RESPIRATORY:  Clear to auscultation without  rales, wheezing or rhonchi  ABDOMEN: Soft, non-tender, non-distended EXTREMITIES:  No edema; No deformity    Wt Readings from Last 3 Encounters:  07/03/23 87.6 kg  06/30/23 88.5 kg  06/27/23 85.7 kg     EKG today demonstrates  Afib with RVR, inc RBBB Vent. rate 147 BPM PR interval * ms QRS duration 114 ms QT/QTcB 328/513 ms  Echo 06/30/23  1. Left ventricular ejection fraction, by estimation, is 45 to 50%. Left  ventricular ejection fraction by PLAX is 46 %. The left ventricle has  mildly decreased function. The left ventricle demonstrates global  hypokinesis. There is mild left ventricular hypertrophy. Left ventricular diastolic function could not be evaluated.   2. Right ventricular systolic function is normal. The right ventricular  size is normal. There is normal pulmonary artery systolic pressure. The  estimated right ventricular systolic pressure is 17.1 mmHg.   3. Left atrial size was mildly dilated.   4. Right atrial size was moderately dilated.   5. The mitral valve is abnormal. Mild mitral valve regurgitation.   6. The aortic valve is tricuspid. Aortic valve regurgitation is not  visualized.   7. The inferior vena cava is normal in size with greater than 50%  respiratory variability, suggesting right atrial pressure of 3 mmHg.    CHA2DS2-VASc Score = 2  The patient's score is based upon: CHF History: 1 HTN History: 0 Diabetes History: 0 Stroke History: 0 Vascular Disease History: 0 Age Score: 1 Gender Score: 0       ASSESSMENT AND PLAN: Persistent Atrial Fibrillation (ICD10:  I48.19) The patient's CHA2DS2-VASc score is 2, indicating a 2.2% annual risk of stroke.   S/p  DCCV 06/26/23 with very quick return of afib. We discussed rhythm control options today including AAD (dofetilide, amiodarone) and ablation. Echo 8/26 showed mildly reduced EF (45-50%). Would be best to avoid class IC and Multaq. After discussing the risks and benefits, will start amiodarone  200 mg BID x 4 weeks then decrease to once daily. If he does not chemically convert, will plan for repeat DCCV after amiodarone loading. Will refer to EP to discuss afib ablation.  Recent cmet reviewed, check TSH today Continue Toprol 50 mg daily Continue Xarelto 20 mg daily  Secondary Hypercoagulable State (ICD10:  D68.69) The patient is at significant risk for stroke/thromboembolism based upon his CHA2DS2-VASc Score of 2.  Continue Rivaroxaban (Xarelto).   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     Jorja Loa PA-C [facility] [address] [place], [place] [address] [phone]

## 2023-07-04 DIAGNOSIS — R69 Illness, unspecified: Secondary | ICD-10-CM | POA: Diagnosis not present

## 2023-07-14 DIAGNOSIS — H35363 Drusen (degenerative) of macula, bilateral: Secondary | ICD-10-CM | POA: Diagnosis not present

## 2023-07-14 DIAGNOSIS — H35453 Secondary pigmentary degeneration, bilateral: Secondary | ICD-10-CM | POA: Diagnosis not present

## 2023-07-14 DIAGNOSIS — H43811 Vitreous degeneration, right eye: Secondary | ICD-10-CM | POA: Diagnosis not present

## 2023-07-14 DIAGNOSIS — H25812 Combined forms of age-related cataract, left eye: Secondary | ICD-10-CM | POA: Diagnosis not present

## 2023-07-14 DIAGNOSIS — H338 Other retinal detachments: Secondary | ICD-10-CM | POA: Diagnosis not present

## 2023-07-14 DIAGNOSIS — Z961 Presence of intraocular lens: Secondary | ICD-10-CM | POA: Diagnosis not present

## 2023-07-14 DIAGNOSIS — H353131 Nonexudative age-related macular degeneration, bilateral, early dry stage: Secondary | ICD-10-CM | POA: Diagnosis not present

## 2023-07-14 DIAGNOSIS — H59811 Chorioretinal scars after surgery for detachment, right eye: Secondary | ICD-10-CM | POA: Diagnosis not present

## 2023-07-16 ENCOUNTER — Other Ambulatory Visit: Payer: Self-pay | Admitting: Family Medicine

## 2023-07-17 ENCOUNTER — Ambulatory Visit (HOSPITAL_COMMUNITY)
Admission: RE | Admit: 2023-07-17 | Discharge: 2023-07-17 | Disposition: A | Discharge status: Home / Self Care | Payer: Medicare HMO | Source: Ambulatory Visit | Attending: Internal Medicine | Admitting: Internal Medicine

## 2023-07-17 DIAGNOSIS — I4819 Other persistent atrial fibrillation: Secondary | ICD-10-CM | POA: Insufficient documentation

## 2023-07-17 DIAGNOSIS — M25521 Pain in right elbow: Secondary | ICD-10-CM | POA: Diagnosis not present

## 2023-07-17 LAB — BASIC METABOLIC PANEL
Anion gap: 11 (ref 5–15)
BUN: 14 mg/dL (ref 8–23)
CO2: 22 mmol/L (ref 22–32)
Calcium: 9.5 mg/dL (ref 8.9–10.3)
Chloride: 103 mmol/L (ref 98–111)
Creatinine, Ser: 1.06 mg/dL (ref 0.61–1.24)
GFR, Estimated: >60 mL/min (ref >60)
Glucose, Bld: 99 mg/dL (ref 70–99)
Potassium: 4.4 mmol/L (ref 3.5–5.1)
Sodium: 136 mmol/L (ref 135–145)

## 2023-07-17 LAB — CBC
HCT: 44.7 % (ref 39.0–52.0)
Hemoglobin: 14.9 g/dL (ref 13.0–17.0)
MCH: 33 pg (ref 26.0–34.0)
MCHC: 33.3 g/dL (ref 30.0–36.0)
MCV: 99.1 fL (ref 80.0–100.0)
Platelets: 301 10*3/uL (ref 150–400)
RBC: 4.51 MIL/uL (ref 4.22–5.81)
RDW: 14.2 % (ref 11.5–15.5)
WBC: 7.7 10*3/uL (ref 4.0–10.5)
nRBC: 0 % (ref 0.0–0.2)

## 2023-07-17 NOTE — Progress Notes (Signed)
Patient began amiodarone load 200 mg BID x 4 weeks on 8/29 as bridge to ablation.    ECG today shows Vent. rate 106 BPM PR interval * ms QRS duration 114 ms QT/QTcB 386/512 ms P-R-T axes * 15 39 Atrial fibrillation with rapid ventricular response Right bundle branch block Abnormal ECG When compared with ECG of 03-Jul-2023 13:34, PREVIOUS ECG IS PRESENT   Pt will be scheduled for DCCV after he completes amiodarone load. Labs obtained today.   Informed Consent   Shared Decision Making/Informed Consent The risks (stroke, cardiac arrhythmias rarely resulting in the need for a temporary or permanent pacemaker, skin irritation or burns and complications associated with conscious sedation including aspiration, arrhythmia, respiratory failure and death), benefits (restoration of normal sinus rhythm) and alternatives of a direct current cardioversion were explained in detail to Gerald [name] and he agrees to proceed.       He will f/u as scheduled with Gerald [name] and then after DCCV with Gerald [name], Gerald [name].

## 2023-07-17 NOTE — Patient Instructions (Addendum)
September 30th reduce Amiodarone to 200mg  once a day   Cardioversion scheduled [id number], October 2nd   - Arrive at the [facility] and go to admitting at 930am   - Do not eat or drink anything after midnight the night prior to your procedure.   - Take all your morning medication (except diabetic medications) with a sip of water prior to arrival.  - You will not be able to drive home after your procedure.    - Do NOT miss any doses of your blood thinner - if you should miss a dose please notify our office immediately.   - If you feel as if you go back into normal rhythm prior to scheduled cardioversion, please notify our office immediately.   If your procedure is canceled in the cardioversion suite you will be charged a cancellation fee.

## 2023-07-28 ENCOUNTER — Other Ambulatory Visit (HOSPITAL_COMMUNITY): Payer: Self-pay | Admitting: Physician Assistant

## 2023-08-03 NOTE — Progress Notes (Unsigned)
Electrophysiology Office Note:   Date:  08/04/2023  ID:  IZAAN VANDERCOOK, DOB 04-Apr-1956, MRN [id number]  Primary Cardiologist: None Electrophysiologist: Nobie Putnam, MD      History of Present Illness:   Gerald [name] is a 67 y.o. male with h/o esophageal stricture, peptic ulcer disease, IBS, and hyperlipidemia seen today for  for Electrophysiology evaluation of atrial fibrillation at the request of Dr. Sharlot Gowda.   Atrial fibrillation was incidentally diagnosed at his primary care physician office on 05/22/2023.  He underwent cardioversion on 06/26/2023 but had recurrence within 24 hours.  Echocardiogram performed while in atrial fibrillation did show mildly reduced LV function, LVEF 45 to 50%.  He was started on amiodarone with plans to repeat cardioversion on 08/06/2023.  He remains in atrial fibrillation today.  He is unsure whether or not he has symptoms in atrial fibrillation.  He continues to both play golf and exercise in the gym regularly, although he has somewhat restricted his activity compared to before since receiving his diagnosis.    Review of systems complete and found to be negative unless listed in HPI.   EP Information / Studies Reviewed:    EKG is ordered today. Personal review as below.  EKG Interpretation Date/Time:  Monday August 04 2023 09:29:10 EDT Ventricular Rate:  93 PR Interval:    QRS Duration:  122 QT Interval:  386 QTC Calculation: 479 R Axis:   -38  Text Interpretation: Atrial fibrillation Right bundle branch block When compared with ECG of 17-Jul-2023 10:28,rate has slowed. Confirmed by Nobie Putnam [phone]) on 08/04/2023 9:39:10 AM   EKG 05/26/23:    Nuclear stress 06/27/2023: No ischemia or infarction. LV perfusion is normal. LVEF mildly decreased.  Echocardiogram 06/30/2023: Mildly decreased LV function, LVEF 45 to 50%.  Global hypokinesis. Normal RV size and function. Mildly dilated left and right atria. Mild MR.  No other  significant valvular disease mentioned.  Risk Assessment/Calculations:    CHA2DS2-VASc Score = 2   This indicates a 2.2% annual risk of stroke. The patient's score is based upon: CHF History: 1 HTN History: 0 Diabetes History: 0 Stroke History: 0 Vascular Disease History: 0 Age Score: 1 Gender Score: 0             Physical Exam:   VS:  BP 128/86 (BP Location: Left Arm, Patient Position: Sitting, Cuff Size: Normal)   Pulse 94   Resp 16   Ht 6' (1.829 m)   Wt 193 lb 9.6 oz (87.8 kg)   SpO2 98%   BMI 26.26 kg/m    Wt Readings from Last 3 Encounters:  08/04/23 193 lb 9.6 oz (87.8 kg)  07/03/23 193 lb 3.2 oz (87.6 kg)  06/30/23 195 lb (88.5 kg)     GEN: Well nourished, well developed in no acute distress NECK: No JVD; No carotid bruits CARDIAC: Irregularly irregular rate and rhythm, no murmurs, rubs, gallops RESPIRATORY:  Clear to auscultation without rales, wheezing or rhonchi  ABDOMEN: Soft, non-tender, non-distended EXTREMITIES:  No edema; No deformity   ASSESSMENT AND PLAN:   Gerald [name] is a 67 y.o. male with h/o esophageal stricture, peptic ulcer disease, IBS, and hyperlipidemia seen today for  for Electrophysiology evaluation of atrial fibrillation at the request of Dr. Sharlot Gowda.  He underwent cardioversion with early recurrence within 24 hours.  Echocardiogram performed in atrial fibrillation did show mildly reduced LV systolic function, LVEF 45 to 62%.  #.  Persistent atrial fibrillation: -Given that he had LV  systolic dysfunction on echocardiogram, he was started on a rhythm control strategy by his general cardiology/A-fib clinic.  He is on oral amiodarone.  He has completed an oral load and we will decrease to 200 mg once daily today. We will reattempt cardioversion on 08/06/2023 now that he has been taking oral amiodarone in hopes of improving our chances of successful cardioversion. -We will continue oral amiodarone post cardioversion in hopes of  maintaining sinus rhythm.  He will pay close attention to his symptoms and functional status while in sinus rhythm to see if there is any improvement when compared to atrial fibrillation. -If he maintains sinus rhythm for at least 3 weeks, then we will repeat a limited echocardiogram to reassess LV systolic function in sinus rhythm. -He he will come back to see me in 4 weeks and we will follow-up for change in symptoms and/or LV function with restoration of sinus rhythm.  If he reports improved symptoms or echo shows improvement in LVEF, then I think importance of a rhythm control strategy will have been demonstrated.  At that point, we would be able to discuss in detail antiarrhythmic drug alternatives to amiodarone versus pursuing catheter ablation.  #.  Secondary hypercoagulable state due to atrial fibrillation: -CHA2DS2-VASc score of 2. -Currently tolerating Xarelto without issue.  He will continue this medication.  #. 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  Hopefully this will be more successful now that he is on amiodarone. -Reassess LV systolic function once in normal sinus rhythm.  If LV function remains low, then we will initiate GDMT and have him follow-up with our heart failure colleagues.  Follow up with Dr. Jimmey Ralph in 4 weeks   Total time of encounter: 65 minutes total time of encounter, including chart review, face-to-face patient care, coordination of care and counseling regarding high complexity medical decision making.  Signed, Nobie Putnam, MD

## 2023-08-03 NOTE — H&P (View-Only) (Signed)
Electrophysiology Office Note:   Date:  08/04/2023  ID:  Gerald [name], DOB July 19, 1956, MRN [id number]  Primary Cardiologist: None Electrophysiologist: Nobie Putnam, MD      History of Present Illness:   Gerald [name] is a 68 y.o. male with h/o esophageal stricture, peptic ulcer disease, IBS, and hyperlipidemia seen today for  for Electrophysiology evaluation of atrial fibrillation at the request of Dr. Sharlot Gowda.   Atrial fibrillation was incidentally diagnosed at his primary care physician office on 05/22/2023.  He underwent cardioversion on 06/26/2023 but had recurrence within 24 hours.  Echocardiogram performed while in atrial fibrillation did show mildly reduced LV function, LVEF 45 to 50%.  He was started on amiodarone with plans to repeat cardioversion on 08/06/2023.  He remains in atrial fibrillation today.  He is unsure whether or not he has symptoms in atrial fibrillation.  He continues to both play golf and exercise in the gym regularly, although he has somewhat restricted his activity compared to before since receiving his diagnosis.    Review of systems complete and found to be negative unless listed in HPI.   EP Information / Studies Reviewed:    EKG is ordered today. Personal review as below.  EKG Interpretation Date/Time:  Monday August 04 2023 09:29:10 EDT Ventricular Rate:  93 PR Interval:    QRS Duration:  122 QT Interval:  386 QTC Calculation: 479 R Axis:   -38  Text Interpretation: Atrial fibrillation Right bundle branch block When compared with ECG of 17-Jul-2023 10:28,rate has slowed. Confirmed by Nobie Putnam [phone]) on 08/04/2023 9:39:10 AM   EKG 05/26/23:    Nuclear stress 06/27/2023: No ischemia or infarction. LV perfusion is normal. LVEF mildly decreased.  Echocardiogram 06/30/2023: Mildly decreased LV function, LVEF 45 to 50%.  Global hypokinesis. Normal RV size and function. Mildly dilated left and right atria. Mild MR.  No other  significant valvular disease mentioned.  Risk Assessment/Calculations:    CHA2DS2-VASc Score = 2   This indicates a 2.2% annual risk of stroke. The patient's score is based upon: CHF History: 1 HTN History: 0 Diabetes History: 0 Stroke History: 0 Vascular Disease History: 0 Age Score: 1 Gender Score: 0             Physical Exam:   VS:  BP 128/86 (BP Location: Left Arm, Patient Position: Sitting, Cuff Size: Normal)   Pulse 94   Resp 16   Ht 6' (1.829 m)   Wt 193 lb 9.6 oz (87.8 kg)   SpO2 98%   BMI 26.26 kg/m    Wt Readings from Last 3 Encounters:  08/04/23 193 lb 9.6 oz (87.8 kg)  07/03/23 193 lb 3.2 oz (87.6 kg)  06/30/23 195 lb (88.5 kg)     GEN: Well nourished, well developed in no acute distress NECK: No JVD; No carotid bruits CARDIAC: Irregularly irregular rate and rhythm, no murmurs, rubs, gallops RESPIRATORY:  Clear to auscultation without rales, wheezing or rhonchi  ABDOMEN: Soft, non-tender, non-distended EXTREMITIES:  No edema; No deformity   ASSESSMENT AND PLAN:   Gerald [name] is a 67 y.o. male with h/o esophageal stricture, peptic ulcer disease, IBS, and hyperlipidemia seen today for  for Electrophysiology evaluation of atrial fibrillation at the request of Dr. Sharlot Gowda.  He underwent cardioversion with early recurrence within 24 hours.  Echocardiogram performed in atrial fibrillation did show mildly reduced LV systolic function, LVEF 45 to 25%.  #.  Persistent atrial fibrillation: -Given that he had LV  systolic dysfunction on echocardiogram, he was started on a rhythm control strategy by his general cardiology/A-fib clinic.  He is on oral amiodarone.  He has completed an oral load and we will decrease to 200 mg once daily today. We will reattempt cardioversion on 08/06/2023 now that he has been taking oral amiodarone in hopes of improving our chances of successful cardioversion. -We will continue oral amiodarone post cardioversion in hopes of  maintaining sinus rhythm.  He will pay close attention to his symptoms and functional status while in sinus rhythm to see if there is any improvement when compared to atrial fibrillation. -If he maintains sinus rhythm for at least 3 weeks, then we will repeat a limited echocardiogram to reassess LV systolic function in sinus rhythm. -He he will come back to see me in 4 weeks and we will follow-up for change in symptoms and/or LV function with restoration of sinus rhythm.  If he reports improved symptoms or echo shows improvement in LVEF, then I think importance of a rhythm control strategy will have been demonstrated.  At that point, we would be able to discuss in detail antiarrhythmic drug alternatives to amiodarone versus pursuing catheter ablation.  #.  Secondary hypercoagulable state due to atrial fibrillation: -CHA2DS2-VASc score of 2. -Currently tolerating Xarelto without issue.  He will continue this medication.  #. 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  Hopefully this will be more successful now that he is on amiodarone. -Reassess LV systolic function once in normal sinus rhythm.  If LV function remains low, then we will initiate GDMT and have him follow-up with our heart failure colleagues.  Follow up with Dr. Jimmey Ralph in 4 weeks   Total time of encounter: 65 minutes total time of encounter, including chart review, face-to-face patient care, coordination of care and counseling regarding high complexity medical decision making.  Signed, Nobie Putnam, MD

## 2023-08-04 ENCOUNTER — Ambulatory Visit: Payer: Medicare HMO | Attending: Cardiology | Admitting: Cardiology

## 2023-08-04 ENCOUNTER — Encounter: Payer: Self-pay | Admitting: Cardiology

## 2023-08-04 VITALS — BP 128/86 | HR 94 | Resp 16 | Ht 72.0 in | Wt 193.6 lb

## 2023-08-04 DIAGNOSIS — I4819 Other persistent atrial fibrillation: Secondary | ICD-10-CM | POA: Diagnosis not present

## 2023-08-04 DIAGNOSIS — I5022 Chronic systolic (congestive) heart failure: Secondary | ICD-10-CM | POA: Diagnosis not present

## 2023-08-04 DIAGNOSIS — D6869 Other thrombophilia: Secondary | ICD-10-CM

## 2023-08-04 DIAGNOSIS — R69 Illness, unspecified: Secondary | ICD-10-CM | POA: Diagnosis not present

## 2023-08-04 NOTE — Patient Instructions (Signed)
Medication Instructions:  Your physician recommends that you continue on your current medications as directed. Please refer to the Current Medication list given to you today.  *If you need a refill on your cardiac medications before your next appointment, please call your pharmacy*  Testing/Procedures: Your physician has requested that you have an echocardiogram. Echocardiography is a painless test that uses sound waves to create images of your heart. It provides your doctor with information about the size and shape of your heart and how well your heart's chambers and valves are working. This procedure takes approximately one hour. There are no restrictions for this procedure. Please do NOT wear cologne, perfume, aftershave, or lotions (deodorant is allowed). Please arrive 15 minutes prior to your appointment time.   Follow-Up: At [facility], you and your health needs are our priority.  As part of our continuing mission to provide you with exceptional heart care, we have created designated Provider Care Teams.  These Care Teams include your primary Cardiologist (physician) and Advanced Practice Providers (APPs -  Physician Assistants and Nurse Practitioners) who all work together to provide you with the care you need, when you need it.  Your next appointment:   4 weeks  Provider:   Nobie Putnam, MD

## 2023-08-05 NOTE — Progress Notes (Signed)
Spoke to patient on phone regarding procedure tomorrow.  Instructed to arrive at 0845 am, NPO after midnight.  Confirmed patient has a ride home and responsible person to stay with patient for 24 hours after the procedure  Confirmed no missed doses of Xarelto, instructed patient to take tonight (takes every evening)

## 2023-08-06 ENCOUNTER — Other Ambulatory Visit: Payer: Self-pay

## 2023-08-06 ENCOUNTER — Encounter (HOSPITAL_COMMUNITY)
Admission: RE | Disposition: A | Discharge status: Home / Self Care | Payer: Self-pay | Source: Home / Self Care | Attending: Internal Medicine

## 2023-08-06 ENCOUNTER — Ambulatory Visit (HOSPITAL_COMMUNITY): Payer: Medicare HMO | Admitting: Anesthesiology

## 2023-08-06 ENCOUNTER — Ambulatory Visit (HOSPITAL_COMMUNITY)
Admission: RE | Admit: 2023-08-06 | Discharge: 2023-08-06 | Disposition: A | Discharge status: Home / Self Care | Payer: Medicare HMO | Attending: Internal Medicine | Admitting: Internal Medicine

## 2023-08-06 DIAGNOSIS — K589 Irritable bowel syndrome without diarrhea: Secondary | ICD-10-CM | POA: Insufficient documentation

## 2023-08-06 DIAGNOSIS — I4891 Unspecified atrial fibrillation: Secondary | ICD-10-CM | POA: Diagnosis not present

## 2023-08-06 DIAGNOSIS — Z79899 Other long term (current) drug therapy: Secondary | ICD-10-CM | POA: Insufficient documentation

## 2023-08-06 DIAGNOSIS — D6869 Other thrombophilia: Secondary | ICD-10-CM | POA: Diagnosis not present

## 2023-08-06 DIAGNOSIS — I4819 Other persistent atrial fibrillation: Secondary | ICD-10-CM | POA: Diagnosis not present

## 2023-08-06 DIAGNOSIS — I110 Hypertensive heart disease with heart failure: Secondary | ICD-10-CM | POA: Insufficient documentation

## 2023-08-06 DIAGNOSIS — Z7901 Long term (current) use of anticoagulants: Secondary | ICD-10-CM | POA: Diagnosis not present

## 2023-08-06 DIAGNOSIS — E785 Hyperlipidemia, unspecified: Secondary | ICD-10-CM | POA: Insufficient documentation

## 2023-08-06 DIAGNOSIS — I5022 Chronic systolic (congestive) heart failure: Secondary | ICD-10-CM | POA: Insufficient documentation

## 2023-08-06 DIAGNOSIS — I10 Essential (primary) hypertension: Secondary | ICD-10-CM | POA: Diagnosis not present

## 2023-08-06 DIAGNOSIS — Z87891 Personal history of nicotine dependence: Secondary | ICD-10-CM | POA: Diagnosis not present

## 2023-08-06 HISTORY — PX: CARDIOVERSION: SHX1299

## 2023-08-06 SURGERY — CARDIOVERSION
Anesthesia: General

## 2023-08-06 MED ORDER — LIDOCAINE 2% (20 MG/ML) 5 ML SYRINGE
INTRAMUSCULAR | Status: DC | PRN
  Administered 2023-08-06: 40 mg via INTRAVENOUS

## 2023-08-06 MED ORDER — PROPOFOL 10 MG/ML IV BOLUS
INTRAVENOUS | Status: DC | PRN
  Administered 2023-08-06: 100 mg via INTRAVENOUS

## 2023-08-06 MED ORDER — SODIUM CHLORIDE 0.9 % IV SOLN: INTRAVENOUS | Status: DC

## 2023-08-06 SURGICAL SUPPLY — 1 items: ELECT DEFIB PAD ADLT CADENCE (PAD) ×2 IMPLANT

## 2023-08-06 NOTE — Interval H&P Note (Signed)
History and Physical Interval Note:  08/06/2023 9:53 AM  Gerald [name]  has presented today for surgery, with the diagnosis of AFIB.  The various methods of treatment have been discussed with the patient and family. After consideration of risks, benefits and other options for treatment, the patient has consented to  Procedure(s): CARDIOVERSION (N/A) as a surgical intervention.  The patient's history has been reviewed, patient examined, no change in status, stable for surgery.  I have reviewed the patient's chart and labs.  Questions were answered to the patient's satisfaction.     Parke Poisson

## 2023-08-06 NOTE — Transfer of Care (Signed)
Immediate Anesthesia Transfer of Care Note  Patient: Gerald [name]  Procedure(s) Performed: CARDIOVERSION  Patient Location: Cath Lab  Anesthesia Type:General  Level of Consciousness: awake, alert , oriented, patient cooperative, and responds to stimulation  Airway & Oxygen Therapy: Patient Spontanous Breathing and Patient connected to nasal cannula oxygen  Post-op Assessment: Report given to RN and Post -op Vital signs reviewed and stable  Post vital signs: Reviewed and stable  Last Vitals:  Vitals Value Taken Time  BP 111/78 08/06/2023 10:18  Temp    Pulse 70 08/06/2023 10:18    Resp 16 08/06/2023 10:18    SpO2 98% 08/06/2023 10:18          Complications: No notable events documented.

## 2023-08-06 NOTE — Discharge Instructions (Signed)
 Electrical Cardioversion Electrical cardioversion is the delivery of a jolt of electricity to restore a normal rhythm to the heart. A rhythm that is too fast or is not regular (arrhythmia) keeps the heart from pumping blood well. There is also another type of cardioversion called a chemical (pharmacologic) cardioversion. This is when your health care provider gives you one or more medicines to bring back your regular heart rhythm. Electrical cardioversion is done as a scheduled procedure for arrhythmiasthat are not life-threatening. Electrical cardioversion may also be done in an emergency for sudden life-threatening arrhythmias. Tell a health care provider about: Any allergies you have. All medicines you are taking, including vitamins, herbs, eye drops, creams, and over-the-counter medicines. Any problems you or family members have had with sedatives or anesthesia. Any bleeding problems you have. Any surgeries you have had, including a pacemaker, defibrillator, or other implanted device. Any medical conditions you have. Whether you are pregnant or may be pregnant. What are the risks? Your provider will talk with you about risks. These include: Allergic reactions to medicines. Irritation to the skin on your chest or back where the sticky pads (electrodes) or paddles were put during electrical cardioversion. A blood clot that breaks free and travels to other parts of your body, such as your brain. Return of a worse abnormal heart rhythm that will need to be treated with medicines, a pacemaker, or an implantable cardioverter defibrillator (ICD). What happens before the procedure? Medicines Your provider may give you: Blood-thinning medicines (anticoagulants) so your blood does not clot as easily. If your provider gives you this medicine, you may need to take it for 4 weeks before the procedure. Medicines to help stabilize your heart rate and rhythm. Ask your provider about: Changing or stopping  your regular medicines. These include any diabetes medicines or blood thinners you take. Taking medicines such as aspirin and ibuprofen. These medicines can thin your blood. Do not take them unless your provider tells you to. Taking over-the-counter medicines, vitamins, herbs, and supplements. General instructions Follow instructions from your provider about what you may eat and drink. Do not put any lotions, powders, or ointments on your chest and back for 24 hours before the procedure. They can cause problems with the electrodes or paddles used to deliver electricity to your heart. Do not wear jewelry as this can interfere with delivering electricity to your heart. If you will be going home right after the procedure, plan to have a responsible adult: Take you home from the hospital or clinic. You will not be allowed to drive. Care for you for the time you are told. Tests You may have an exam or testing. This may include: Blood labs. A transesophageal echocardiogram (TEE). What happens during the procedure?     An IV will be inserted into one of your veins. You will be given a sedative. This helps you relax. Electrodes or metal paddles will be placed on your chest. They may be placed in one of these ways: One placed on your right chest, the other on the left ribs. One placed on your chest and the other on your back. An electrical shock will be delivered. The shock briefly stops (resets) your heart rhythm. Your provider will check to see if your heart rhythm is now normal. Some people need only one shock. Some need more to restore a normal heart rhythm. The procedure may vary among providers and hospitals. What happens after the procedure? Your blood pressure, heart rate, breathing rate, and blood oxygen  level will be monitored until you leave the hospital or clinic. Your heart rhythm will be watched to make sure it does not change. This information is not intended to replace advice  given to you by your health care provider. Make sure you discuss any questions you have with your health care provider. Document Revised: 06/13/2022 Document Reviewed: 06/13/2022 Elsevier Patient Education  2024 [employer].

## 2023-08-06 NOTE — Anesthesia Postprocedure Evaluation (Signed)
Anesthesia Post Note  Patient: MESHILEM MACHUCA  Procedure(s) Performed: CARDIOVERSION     Patient location during evaluation: Cath Lab Anesthesia Type: General Level of consciousness: awake and alert, patient cooperative and oriented Pain management: pain level controlled Vital Signs Assessment: post-procedure vital signs reviewed and stable Respiratory status: spontaneous breathing, nonlabored ventilation and respiratory function stable Cardiovascular status: blood pressure returned to baseline and stable Postop Assessment: no apparent nausea or vomiting Anesthetic complications: no   No notable events documented.  Last Vitals:  Vitals:   08/06/23 0910 08/06/23 1019  BP: (!) 126/93 111/78  Pulse: 81 70  Resp: 12 15  Temp:  (!) 36.3 C  SpO2: 97% 97%    Last Pain:  Vitals:   08/06/23 1019  TempSrc: Temporal  PainSc: 0-No pain                 Illeana Edick,E. Kemani Demarais

## 2023-08-06 NOTE — CV Procedure (Signed)
Procedure: Electrical Cardioversion Indications:  Atrial Fibrillation  Procedure Details:  Consent: Risks of procedure as well as the alternatives and risks of each were explained to the (patient/caregiver).  Consent for procedure obtained.  Time Out: Verified patient identification, verified procedure, site/side was marked, verified correct patient position, special equipment/implants available, medications/allergies/relevent history reviewed, required imaging and test results available. PERFORMED.  Patient placed on cardiac monitor, pulse oximetry, supplemental oxygen as necessary.  Sedation given:  propofol per anesthesia Pacer pads placed anterior chest.  Cardioverted 1 time(s).  Cardioversion with synchronized biphasic 200J shock.  Evaluation: Findings: Post procedure EKG shows: NSR Complications: None Patient did tolerate procedure well.  Time Spent Directly with the Patient:  20 minutes   Parke Poisson 08/06/2023, 10:17 AM

## 2023-08-06 NOTE — Anesthesia Preprocedure Evaluation (Addendum)
Anesthesia Evaluation  Patient identified by MRN, date of birth, ID band Patient awake    Reviewed: Allergy & Precautions, NPO status , Patient's Chart, lab work & pertinent test results  History of Anesthesia Complications (+) PONV  Airway Mallampati: II  TM Distance: >3 FB Neck ROM: Full    Dental  (+) Teeth Intact, Dental Advisory Given   Pulmonary former smoker   breath sounds clear to auscultation       Cardiovascular hypertension, Pt. on medications (-) angina + dysrhythmias Atrial Fibrillation  Rhythm:Irregular Rate:Normal  06/2023 ECHO: EF 45 to 50%.  1. LV EF 46 %. The LV has mildly decreased function, global hypokinesis. There is mild LVH. Left ventricular diastolic function could not be evaluated.   2. RVF is normal. The right ventricular  size is normal. There is normal pulmonary artery systolic pressure. The  estimated right ventricular systolic pressure is 17.1 mmHg.   3. Left atrial size was mildly dilated.   4. Right atrial size was moderately dilated.   5. The mitral valve is abnormal. Mild mitral valve regurgitation.   6. The aortic valve is tricuspid. Aortic valve regurgitation is not  visualized.     Neuro/Psych negative neurological ROS     GI/Hepatic Neg liver ROS,GERD  Medicated and Controlled,,  Endo/Other  negative endocrine ROS    Renal/GU negative Renal ROS     Musculoskeletal   Abdominal   Peds  Hematology Xarelto   Anesthesia Other Findings   Reproductive/Obstetrics                              Anesthesia Physical Anesthesia Plan  ASA: 3  Anesthesia Plan: General   Post-op Pain Management: Minimal or no pain anticipated   Induction: Intravenous  PONV Risk Score and Plan: 3 and Treatment may vary due to age or medical condition  Airway Management Planned: Natural Airway and Nasal Cannula  Additional Equipment: None  Intra-op Plan:    Post-operative Plan: Extubation in OR  Informed Consent: I have reviewed the patients History and Physical, chart, labs and discussed the procedure including the risks, benefits and alternatives for the proposed anesthesia with the patient or authorized representative who has indicated his/her understanding and acceptance.     Dental advisory given  Plan Discussed with: CRNA and Surgeon  Anesthesia Plan Comments:         Anesthesia Quick Evaluation

## 2023-08-07 ENCOUNTER — Encounter (HOSPITAL_COMMUNITY): Payer: Self-pay | Admitting: Internal Medicine

## 2023-08-07 ENCOUNTER — Telehealth: Payer: Self-pay | Admitting: Cardiology

## 2023-08-07 NOTE — Telephone Encounter (Signed)
Patient c/o Palpitations:  STAT if patient reporting lightheadedness, shortness of breath, or chest pain  How long have you had palpitations/irregular HR/ Afib? Are you having the symptoms now? Today playing golf.  Are you currently experiencing lightheadedness, SOB or CP? No   Do you have a history of afib (atrial fibrillation) or irregular heart rhythm? Yes   Have you checked your BP or HR? (document readings if available): no   Are you experiencing any other symptoms? HR at 122

## 2023-08-07 NOTE — Telephone Encounter (Signed)
Pt called in with complaints of going back into afib s/p DCCV yesterday.  Pt had a successful DCCV yesterday.   Pt states he was in NSR this morning when he checked, with a HR of 70 bpm.    Pt states he then went to play golf today about mid afternoon and has just returned home, and noted he went back into afib.    Pt states when he returned home he checked his [employer] mobile app and noted he was back in afib with a HR- 122 bpm.  Pt states he is completely asymptomatic with no complaints of sob, chest pain, palpitations, fluttering, dizziness, pre-syncope or syncope.   He states he feels fine, just worried about being back in afib after the DCCV yesterday.  Asked the pt if he has taken his Toprol XL today and he informed me that he had NOT this yet.   Advised him to go ahead and take his Toprol XL now and I will send a message to Dr. Jimmey Ralph and his  RN to further advise and follow-up with him on this matter.   Advised the pt that he needs to keep his afib clinic appt as scheduled for 10/16, and we may end up moving this up, if Dr. Jimmey Ralph feels this is necessary.   ED precautions provided to the pt,  if he were to start developing any cardiac symptoms and/or doesn't see a positive response with his rates after taking his metoprolol regimen.  Pt verbalized understanding and agrees with this plan.  He is aware that Dr. Lavone Neri RN will follow-up with him accordingly thereafter, once further advisement received.

## 2023-08-08 NOTE — Telephone Encounter (Signed)
Patient is scheduled to see Dr. Jimmey Ralph on Monday 10/7

## 2023-08-09 NOTE — Progress Notes (Unsigned)
Electrophysiology Office Note:   Date:  08/11/2023  ID:  Gerald [name], DOB [birth year], MRN [id number]  Primary Cardiologist: None Electrophysiologist: Nobie Putnam, MD      History of Present Illness:   Gerald [name] is a 67 y.o. male with h/o esophageal stricture, peptic ulcer disease, IBS, and hyperlipidemia seen today for  for Electrophysiology evaluation of atrial fibrillation at the request of Dr. Sharlot Gowda.   He was seen in clinic on 08/04/2023.  Cardioversion was performed on/08/06/2023.  The next day he went and played golf.  Upon returning home from golf he checked his heart rate and saw that it was elevated again, distant with atrial fibrillation.  He remained in normal sinus rhythm for less than 24 hours. States that he mentally felt better being back in sinus rhythm. Disappointed regarding his quick recurrence. Otherwise continuing to report feeling well. No chest pain, shortness of breath, dizziness or lightheadedness.   Review of systems complete and found to be negative unless listed in HPI.   EP Information / Studies Reviewed:    EKG is ordered today. Personal review as below.  EKG Interpretation Date/Time:  Monday August 11 2023 16:04:55 EDT Ventricular Rate:  115 PR Interval:    QRS Duration:  120 QT Interval:  352 QTC Calculation: 486 R Axis:   45  Text Interpretation: Atrial fibrillation with rapid ventricular response . Right bundle branch block When compared with ECG of 06-Aug-2023 10:15,back in atrial fibrillation. Confirmed by Nobie Putnam [phone]) on 08/11/2023 7:51:05 PM   Nuclear stress 06/27/2023: No ischemia or infarction. LV perfusion is normal. LVEF mildly decreased.   Echocardiogram 06/30/2023: Mildly decreased LV function, LVEF 45 to 50%.  Global hypokinesis. Normal RV size and function. Mildly dilated left and right atria. Mild MR.  No other significant valvular disease mentioned.  Risk Assessment/Calculations:    CHA2DS2-VASc  Score = 2   This indicates a 2.2% annual risk of stroke. The patient's score is based upon: CHF History: 1 HTN History: 0 Diabetes History: 0 Stroke History: 0 Vascular Disease History: 0 Age Score: 1 Gender Score: 0             Physical Exam:   VS:  BP 126/84   Pulse (!) 115   Ht 6' (1.829 m)   Wt 194 lb 12.8 oz (88.4 kg)   SpO2 98%   BMI 26.42 kg/m    Wt Readings from Last 3 Encounters:  08/11/23 194 lb 12.8 oz (88.4 kg)  08/06/23 190 lb (86.2 kg)  08/04/23 193 lb 9.6 oz (87.8 kg)     GEN: Well nourished, well developed in no acute distress NECK: No JVD; No carotid bruits CARDIAC: Irregularly irregular rhythm, tachycardic RESPIRATORY:  Clear to auscultation without rales, wheezing or rhonchi  ABDOMEN: Soft, non-tender, non-distended EXTREMITIES:  No edema; No deformity   ASSESSMENT AND PLAN:   Gerald [name] is a 67 y.o. male with h/o esophageal stricture, peptic ulcer disease, IBS, and hyperlipidemia seen today for  for Electrophysiology evaluation of atrial fibrillation at the request of Dr. Sharlot Gowda.  He underwent cardioversion with early recurrence within 24 hours.  Echocardiogram performed in atrial fibrillation did show mildly reduced LV systolic function, LVEF 45 to 33%.   #.  Persistent atrial fibrillation: -Given that he had LV systolic dysfunction on echocardiogram, he was started on a rhythm control strategy by his general cardiology/A-fib clinic.   -He is on oral amiodarone but had ERAF after cardioversion. He would like  to try cardioversion again. It is reasonable to ensure that he has been adequately loaded with amiodarone before reattempting. We will increase amiodarone to 400mg  twice daily for 2 weeks then perform cardioversion. After cardioversion we will decrease to 200mg  once daily. We would like to avoid amiodarone long term. For now we will pursue ablation, but if he needs long term AAD then we will transition to dofetilide. -Discussed treatment  options today for AF including antiarrhythmic drug therapy and ablation. Discussed risks, recovery and likelihood of success with each treatment strategy. Risk, benefits, and alternatives to EP study and ablation for afib were discussed. These risks include but are not limited to stroke, bleeding, vascular damage, tamponade, perforation, damage to the esophagus, lungs, phrenic nerve and other structures, pulmonary vein stenosis, worsening renal function, coronary vasospasm and death.  Discussed potential need for repeat ablation procedures and antiarrhythmic drugs after an initial ablation. The patient understands these risk and wishes to proceed.  We will therefore proceed with catheter ablation at the next available time.  Carto, ICE, anesthesia are requested for the procedure.  Will also obtain CT PV protocol prior to the procedure to exclude LAA thrombus and further evaluate atrial anatomy.   #.  Secondary hypercoagulable state due to atrial fibrillation: -CHA2DS2-VASc score of 2. -Currently tolerating Xarelto without issue.  He will continue this medication.   #. 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 Hopefully this will be more successful now that with a full amiodarone load. -Reassess LV systolic function once in normal sinus rhythm. If LV function remains low, then we will initiate GDMT and have him follow-up with our heart failure colleagues.  Total time of encounter: 65 minutes total time of encounter, including chart review, face-to-face patient care, coordination of care and counseling regarding high complexity medical decision making.  Signed, Nobie Putnam, MD

## 2023-08-09 NOTE — H&P (View-Only) (Signed)
Electrophysiology Office Note:   Date:  08/11/2023  ID:  Gerald [name], DOB [birth year], MRN [id number]  Primary Cardiologist: None Electrophysiologist: Nobie Putnam, MD      History of Present Illness:   Gerald [name] is a 67 y.o. male with h/o esophageal stricture, peptic ulcer disease, IBS, and hyperlipidemia seen today for  for Electrophysiology evaluation of atrial fibrillation at the request of Dr. Sharlot Gowda.   He was seen in clinic on 08/04/2023.  Cardioversion was performed on/08/06/2023.  The next day he went and played golf.  Upon returning home from golf he checked his heart rate and saw that it was elevated again, distant with atrial fibrillation.  He remained in normal sinus rhythm for less than 24 hours. States that he mentally felt better being back in sinus rhythm. Disappointed regarding his quick recurrence. Otherwise continuing to report feeling well. No chest pain, shortness of breath, dizziness or lightheadedness.   Review of systems complete and found to be negative unless listed in HPI.   EP Information / Studies Reviewed:    EKG is ordered today. Personal review as below.  EKG Interpretation Date/Time:  Monday August 11 2023 16:04:55 EDT Ventricular Rate:  115 PR Interval:    QRS Duration:  120 QT Interval:  352 QTC Calculation: 486 R Axis:   45  Text Interpretation: Atrial fibrillation with rapid ventricular response . Right bundle branch block When compared with ECG of 06-Aug-2023 10:15,back in atrial fibrillation. Confirmed by Nobie Putnam [phone]) on 08/11/2023 7:51:05 PM   Nuclear stress 06/27/2023: No ischemia or infarction. LV perfusion is normal. LVEF mildly decreased.   Echocardiogram 06/30/2023: Mildly decreased LV function, LVEF 45 to 50%.  Global hypokinesis. Normal RV size and function. Mildly dilated left and right atria. Mild MR.  No other significant valvular disease mentioned.  Risk Assessment/Calculations:    CHA2DS2-VASc  Score = 2   This indicates a 2.2% annual risk of stroke. The patient's score is based upon: CHF History: 1 HTN History: 0 Diabetes History: 0 Stroke History: 0 Vascular Disease History: 0 Age Score: 1 Gender Score: 0             Physical Exam:   VS:  BP 126/84   Pulse (!) 115   Ht 6' (1.829 m)   Wt 194 lb 12.8 oz (88.4 kg)   SpO2 98%   BMI 26.42 kg/m    Wt Readings from Last 3 Encounters:  08/11/23 194 lb 12.8 oz (88.4 kg)  08/06/23 190 lb (86.2 kg)  08/04/23 193 lb 9.6 oz (87.8 kg)     GEN: Well nourished, well developed in no acute distress NECK: No JVD; No carotid bruits CARDIAC: Irregularly irregular rhythm, tachycardic RESPIRATORY:  Clear to auscultation without rales, wheezing or rhonchi  ABDOMEN: Soft, non-tender, non-distended EXTREMITIES:  No edema; No deformity   ASSESSMENT AND PLAN:   Gerald [name] is a 67 y.o. male with h/o esophageal stricture, peptic ulcer disease, IBS, and hyperlipidemia seen today for  for Electrophysiology evaluation of atrial fibrillation at the request of Dr. Sharlot Gowda.  He underwent cardioversion with early recurrence within 24 hours.  Echocardiogram performed in atrial fibrillation did show mildly reduced LV systolic function, LVEF 45 to 33%.   #.  Persistent atrial fibrillation: -Given that he had LV systolic dysfunction on echocardiogram, he was started on a rhythm control strategy by his general cardiology/A-fib clinic.   -He is on oral amiodarone but had ERAF after cardioversion. He would like  to try cardioversion again. It is reasonable to ensure that he has been adequately loaded with amiodarone before reattempting. We will increase amiodarone to 400mg  twice daily for 2 weeks then perform cardioversion. After cardioversion we will decrease to 200mg  once daily. We would like to avoid amiodarone long term. For now we will pursue ablation, but if he needs long term AAD then we will transition to dofetilide. -Discussed treatment  options today for AF including antiarrhythmic drug therapy and ablation. Discussed risks, recovery and likelihood of success with each treatment strategy. Risk, benefits, and alternatives to EP study and ablation for afib were discussed. These risks include but are not limited to stroke, bleeding, vascular damage, tamponade, perforation, damage to the esophagus, lungs, phrenic nerve and other structures, pulmonary vein stenosis, worsening renal function, coronary vasospasm and death.  Discussed potential need for repeat ablation procedures and antiarrhythmic drugs after an initial ablation. The patient understands these risk and wishes to proceed.  We will therefore proceed with catheter ablation at the next available time.  Carto, ICE, anesthesia are requested for the procedure.  Will also obtain CT PV protocol prior to the procedure to exclude LAA thrombus and further evaluate atrial anatomy.   #.  Secondary hypercoagulable state due to atrial fibrillation: -CHA2DS2-VASc score of 2. -Currently tolerating Xarelto without issue.  He will continue this medication.   #. 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 Hopefully this will be more successful now that with a full amiodarone load. -Reassess LV systolic function once in normal sinus rhythm. If LV function remains low, then we will initiate GDMT and have him follow-up with our heart failure colleagues.  Total time of encounter: 65 minutes total time of encounter, including chart review, face-to-face patient care, coordination of care and counseling regarding high complexity medical decision making.  Signed, Nobie Putnam, MD

## 2023-08-11 ENCOUNTER — Encounter: Payer: Self-pay | Admitting: Cardiology

## 2023-08-11 ENCOUNTER — Ambulatory Visit: Payer: Medicare HMO | Attending: Cardiology | Admitting: Cardiology

## 2023-08-11 ENCOUNTER — Ambulatory Visit: Payer: Medicare HMO | Admitting: Family Medicine

## 2023-08-11 ENCOUNTER — Other Ambulatory Visit: Payer: Medicare HMO

## 2023-08-11 ENCOUNTER — Other Ambulatory Visit: Payer: Self-pay | Admitting: Family Medicine

## 2023-08-11 VITALS — BP 126/84 | HR 115 | Ht 72.0 in | Wt 194.8 lb

## 2023-08-11 DIAGNOSIS — E7439 Other disorders of intestinal carbohydrate absorption: Secondary | ICD-10-CM

## 2023-08-11 DIAGNOSIS — D6869 Other thrombophilia: Secondary | ICD-10-CM

## 2023-08-11 DIAGNOSIS — Z0000 Encounter for general adult medical examination without abnormal findings: Secondary | ICD-10-CM

## 2023-08-11 DIAGNOSIS — I4819 Other persistent atrial fibrillation: Secondary | ICD-10-CM

## 2023-08-11 DIAGNOSIS — I700 Atherosclerosis of aorta: Secondary | ICD-10-CM | POA: Diagnosis not present

## 2023-08-11 DIAGNOSIS — I5022 Chronic systolic (congestive) heart failure: Secondary | ICD-10-CM

## 2023-08-11 DIAGNOSIS — I4891 Unspecified atrial fibrillation: Secondary | ICD-10-CM | POA: Diagnosis not present

## 2023-08-11 LAB — COMPREHENSIVE METABOLIC PANEL
ALT: 25 [IU]/L (ref 0–44)
AST: 23 [IU]/L (ref 0–40)
Albumin: 4.5 g/dL (ref 3.9–4.9)
Alkaline Phosphatase: 77 [IU]/L (ref 44–121)
BUN/Creatinine Ratio: 16 (ref 10–24)
BUN: 18 mg/dL (ref 8–27)
Bilirubin Total: 0.3 mg/dL (ref 0.0–1.2)
CO2: 25 mmol/L (ref 20–29)
Calcium: 9.5 mg/dL (ref 8.6–10.2)
Chloride: 101 mmol/L (ref 96–106)
Creatinine, Ser: 1.14 mg/dL (ref 0.76–1.27)
Globulin, Total: 2.3 g/dL (ref 1.5–4.5)
Glucose: 106 mg/dL — ABNORMAL HIGH (ref 70–99)
Potassium: 5.1 mmol/L (ref 3.5–5.2)
Sodium: 141 mmol/L (ref 134–144)
Total Protein: 6.8 g/dL (ref 6.0–8.5)
eGFR: 70 mL/min/{1.73_m2} (ref >59)

## 2023-08-11 LAB — CBC WITH DIFFERENTIAL/PLATELET
Basophils Absolute: 0 10*3/uL (ref 0.0–0.2)
Basos: 1 %
EOS (ABSOLUTE): 0.2 10*3/uL (ref 0.0–0.4)
Eos: 3 %
Hematocrit: 46.4 % (ref 37.5–51.0)
Hemoglobin: 15.2 g/dL (ref 13.0–17.7)
Immature Grans (Abs): 0 10*3/uL (ref 0.0–0.1)
Immature Granulocytes: 0 %
Lymphocytes Absolute: 2.3 10*3/uL (ref 0.7–3.1)
Lymphs: 34 %
MCH: 33 pg (ref 26.6–33.0)
MCHC: 32.8 g/dL (ref 31.5–35.7)
MCV: 101 fL — ABNORMAL HIGH (ref 79–97)
Monocytes Absolute: 0.5 10*3/uL (ref 0.1–0.9)
Monocytes: 8 %
Neutrophils Absolute: 3.7 10*3/uL (ref 1.4–7.0)
Neutrophils: 54 %
Platelets: 297 10*3/uL (ref 150–450)
RBC: 4.6 x10E6/uL (ref 4.14–5.80)
RDW: 13.9 % (ref 11.6–15.4)
WBC: 6.8 10*3/uL (ref 3.4–10.8)

## 2023-08-11 LAB — LIPID PANEL
Chol/HDL Ratio: 6 {ratio} — ABNORMAL HIGH (ref 0.0–5.0)
Cholesterol, Total: 259 mg/dL — ABNORMAL HIGH (ref 100–199)
HDL: 43 mg/dL (ref >39)
LDL Chol Calc (NIH): 160 mg/dL — ABNORMAL HIGH (ref 0–99)
Triglycerides: 298 mg/dL — ABNORMAL HIGH (ref 0–149)
VLDL Cholesterol Cal: 56 mg/dL — ABNORMAL HIGH (ref 5–40)

## 2023-08-11 MED ORDER — METOPROLOL SUCCINATE ER 50 MG PO TB24: 50.0000 mg | ORAL_TABLET | Freq: Two times a day (BID) | ORAL | 3 refills | Status: DC

## 2023-08-11 NOTE — Patient Instructions (Addendum)
Medication Instructions:  Your physician has recommended you make the following change in your medication:  1) INCREASE amiodarone 400 twice daily for 2 weeks 2) INCREASE metoprolol to 50 mg twice daily   *If you need a refill on your cardiac medications before your next appointment, please call your pharmacy*  Lab Work: BMET and CBC  Testing/Procedures: Your physician has recommended that you have a Cardioversion (DCCV). Electrical Cardioversion uses a jolt of electricity to your heart either through paddles or wired patches attached to your chest. This is a controlled, usually prescheduled, procedure. Defibrillation is done under light anesthesia in the hospital, and you usually go home the day of the procedure. This is done to get your heart back into a normal rhythm. You are not awake for the procedure. Please see the instruction sheet given to you today. Plan for October 21st - I will call you later this week to confirm.   Your physician has requested that you have cardiac CT. Cardiac computed tomography (CT) is a painless test that uses an x-ray machine to take clear, detailed pictures of your heart. For further information please visit [email or web address] We will call you to schedule your CT scan. It will be done about one week prior to your ablation.  Your physician has recommended that you have an ablation. Catheter ablation is a medical procedure used to treat some cardiac arrhythmias (irregular heartbeats). During catheter ablation, a long, thin, flexible tube is put into a blood vessel in your groin (upper thigh), or neck. This tube is called an ablation catheter. It is then guided to your heart through the blood vessel. Radio frequency waves destroy small areas of heart tissue where abnormal heartbeats may cause an arrhythmia to start. You are scheduled for Atrial Fibrillation Ablation on Monday, November 18th with Dr. Michele Rockers.Please arrive at the Main Entrance A at [facility]: [address] [place], [place] [address] at 8:30AM.  Follow-Up: At [facility], you and your health needs are our priority.  As part of our continuing mission to provide you with exceptional heart care, we have created designated Provider Care Teams.  These Care Teams include your primary Cardiologist (physician) and Advanced Practice Providers (APPs -  Physician Assistants and Nurse Practitioners) who all work together to provide you with the care you need, when you need it.  Your next appointment:   We will call you to arrange your follow up appointments.

## 2023-08-14 ENCOUNTER — Ambulatory Visit (INDEPENDENT_AMBULATORY_CARE_PROVIDER_SITE_OTHER): Payer: Medicare HMO | Admitting: Family Medicine

## 2023-08-14 ENCOUNTER — Encounter: Payer: Self-pay | Admitting: Family Medicine

## 2023-08-14 ENCOUNTER — Telehealth: Payer: Self-pay | Admitting: Cardiology

## 2023-08-14 VITALS — BP 122/80 | HR 85 | Ht 73.0 in | Wt 196.0 lb

## 2023-08-14 DIAGNOSIS — Z8719 Personal history of other diseases of the digestive system: Secondary | ICD-10-CM | POA: Diagnosis not present

## 2023-08-14 DIAGNOSIS — Z23 Encounter for immunization: Secondary | ICD-10-CM | POA: Diagnosis not present

## 2023-08-14 DIAGNOSIS — Z9889 Other specified postprocedural states: Secondary | ICD-10-CM

## 2023-08-14 DIAGNOSIS — E119 Type 2 diabetes mellitus without complications: Secondary | ICD-10-CM

## 2023-08-14 DIAGNOSIS — Z860100 Personal history of colon polyps, unspecified: Secondary | ICD-10-CM

## 2023-08-14 DIAGNOSIS — K219 Gastro-esophageal reflux disease without esophagitis: Secondary | ICD-10-CM | POA: Diagnosis not present

## 2023-08-14 DIAGNOSIS — Z0000 Encounter for general adult medical examination without abnormal findings: Secondary | ICD-10-CM

## 2023-08-14 DIAGNOSIS — I4819 Other persistent atrial fibrillation: Secondary | ICD-10-CM

## 2023-08-14 DIAGNOSIS — I700 Atherosclerosis of aorta: Secondary | ICD-10-CM | POA: Diagnosis not present

## 2023-08-14 DIAGNOSIS — D6869 Other thrombophilia: Secondary | ICD-10-CM | POA: Diagnosis not present

## 2023-08-14 DIAGNOSIS — M48061 Spinal stenosis, lumbar region without neurogenic claudication: Secondary | ICD-10-CM

## 2023-08-14 DIAGNOSIS — R7303 Prediabetes: Secondary | ICD-10-CM

## 2023-08-14 LAB — POCT GLYCOSYLATED HEMOGLOBIN (HGB A1C): Hemoglobin A1C: 6.9 % — AB (ref 4.0–5.6)

## 2023-08-14 MED ORDER — ESOMEPRAZOLE MAGNESIUM 40 MG PO CPDR
40.0000 mg | DELAYED_RELEASE_CAPSULE | Freq: Every day | ORAL | 3 refills | Status: DC
Start: 2023-08-14 — End: 2023-09-16

## 2023-08-14 MED ORDER — AMIODARONE HCL 200 MG PO TABS: ORAL_TABLET | ORAL | 3 refills | Status: DC

## 2023-08-14 MED ORDER — ROSUVASTATIN CALCIUM 20 MG PO TABS
20.0000 mg | ORAL_TABLET | Freq: Every day | ORAL | 3 refills | Status: DC
Start: 2023-08-14 — End: 2024-08-09

## 2023-08-14 NOTE — Telephone Encounter (Signed)
Patient's question has been addressed in a MyChart message. Nothing further needed at this time.

## 2023-08-14 NOTE — Progress Notes (Signed)
Gerald [name] is a 67 y.o. male who presents for annual wellness visit ,CPE and follow-up on chronic medical conditions.  He has most recently has been dealing with atrial fibrillation and trying to get this controlled.  He has had a cardioversion that was unsuccessful and is planning another 1.  Apparently they are also considering doing an ablation.  He seems to be tolerating this pretty well but psychologically is starting to take a toll.  He is now taking Xarelto as well as Toprol and Pacerone.  He is followed closely by cardiology.  He has had some back issues and has had previous surgery on his back.  He  does have a history of colonic polyps and is followed regularly by gastroenterology and is scheduled for follow-up colonoscopy.  He does have reflux disease and does use Nexium on an every 3-day cycle.  He does have a cataract and does follow-up with ophthalmology concerning this.  Does have previous blood work that does show evidence of prediabetes.  Immunizations and Health Maintenance Immunization History  Administered Date(s) Administered   Fluad Quad(high Dose 65+) 07/23/2021, 08/05/2022   Fluad Trivalent(High Dose 65+) 08/14/2023   Influenza, Quadrivalent, Recombinant, Inj, Pf 08/05/2019   Influenza,inj,Quad PF,6+ Mos 09/11/2018, 07/17/2020   Influenza-Unspecified 09/11/2018   PFIZER(Purple Top)SARS-COV-2 Vaccination 01/15/2020, 02/07/2020, 10/26/2020   PNEUMOCOCCAL CONJUGATE-20 08/14/2023   PPD Test 03/06/2002   Tdap 05/25/2009, 06/21/2019   Zoster Recombinant(Shingrix) 07/08/2017, 09/23/2017   Zoster, Live 07/09/2016   Health Maintenance Due  Topic Date Due   FOOT EXAM  Never done   OPHTHALMOLOGY EXAM  Never done   Diabetic kidney evaluation - Urine ACR  Never done    Last colonoscopy: March 2021 Last PSA: October 2023 Dentist: Dr. Kathrene Bongo [facility], next visit October 2024 Ophtho: Dr. Fabian Sharp, August 2024 Exercise: Golf 5 days a week and moderate  weightlifting at gym 2 days a week   Other doctors caring for patient include: Dr. Nobie Putnam, Cardiology   Advanced Directives:  Living Will and Power of Attorney  Depression screen:  See questionnaire below.        08/14/2023   10:14 AM 08/05/2022    8:26 AM 07/23/2021    8:31 AM 07/17/2020    8:22 AM 06/21/2019    2:37 PM  Depression screen PHQ 2/9  Decreased Interest 0 0 0 0 0  Down, Depressed, Hopeless 0 0 0 0 0  PHQ - 2 Score 0 0 0 0 0    Fall Screen: See Questionaire below.      08/14/2023   10:13 AM 05/22/2023   11:05 AM 08/05/2022    8:26 AM 07/25/2022   11:02 AM 07/23/2021    8:30 AM  Fall Risk   Falls in the past year? 0 0 0 0 0  Number falls in past yr: 0 0 0 0 0  Injury with Fall? 0 0 0 0 0  Risk for fall due to :  No Fall Risks No Fall Risks    Follow up  Falls evaluation completed Falls evaluation completed  Falls evaluation completed  The 10-year ASCVD risk score (Arnett DK, et al., 2019) is: 34.7%   Values used to calculate the score:     Age: 60 years     Sex: Male     Is Non-Hispanic African American: No     Diabetic: Yes     Tobacco smoker: No     Systolic Blood Pressure: 122 mmHg  Is BP treated: Yes     HDL Cholesterol: 43 mg/dL     Total Cholesterol: 259 mg/dL   ADL screen:  See questionnaire below.  Functional Status Survey: Is the patient deaf or have difficulty hearing?: No Does the patient have difficulty seeing, even when wearing glasses/contacts?: No Does the patient have difficulty concentrating, remembering, or making decisions?: No Does the patient have difficulty walking or climbing stairs?: No Does the patient have difficulty dressing or bathing?: No Does the patient have difficulty doing errands alone such as visiting a doctor's office or shopping?: No   Review of Systems  Constitutional: -, -unexpected weight change, -anorexia, -fatigue Allergy: -sneezing, -itching, -congestion Dermatology: denies changing moles, rash,  lumps ENT: -runny nose, -ear pain, -sore throat,  Cardiology:  -chest pain, -palpitations, -orthopnea, Respiratory: -cough, -shortness of breath, -dyspnea on exertion, -wheezing,  Gastroenterology: -abdominal pain, -nausea, -vomiting, -diarrhea, -constipation, -dysphagia Hematology: -bleeding or bruising problems Musculoskeletal: -arthralgias, -myalgias, -joint swelling, -back pain, - Ophthalmology: -vision changes,  Urology: -dysuria, -difficulty urinating,  -urinary frequency, -urgency, incontinence Neurology: -, -numbness, , -memory loss, -falls, -dizziness    PHYSICAL EXAM:  BP 122/80   Pulse 85   Ht 6\' 1"  (1.854 m)   Wt 196 lb (88.9 kg)   SpO2 95%   BMI 25.86 kg/m   General Appearance: Alert, cooperative, no distress, appears stated age Head: Normocephalic, without obvious abnormality, atraumatic Eyes: PERRL, conjunctiva/corneas clear, EOM's intact,  Ears: Normal TM's and external ear canals Nose: Nares normal, mucosa normal, no drainage or sinus   tenderness Throat: Lips, mucosa, and tongue normal; teeth and gums normal Neck: Supple, no lymphadenopathy, thyroid:no enlargement/tenderness/nodules; no carotid bruit or JVD Lungs: Clear to auscultation bilaterally without wheezes, rales or ronchi; respirations unlabored Heart: Regular rate and rhythm, S1 and S2 normal, no murmur, rub or gallop Skin: Skin color, texture, turgor normal, no rashes or lesions Lymph nodes: Cervical, supraclavicular, and axillary nodes normal Neurologic: CNII-XII intact, normal strength, sensation and gait; reflexes 2+ and symmetric throughout   Psych: Normal mood, affect, hygiene and grooming Hemoglobin A1c is 6.9 ASSESSMENT/PLAN: Routine general medical examination at a health care facility  Aortic atherosclerosis (HCC) - Plan: rosuvastatin (CRESTOR) 20 MG tablet  Degenerative lumbar spinal stenosis  Gastroesophageal reflux disease without esophagitis - Plan: esomeprazole (NEXIUM) 40 MG  capsule  History of back surgery  History of colonic polyps  Persistent atrial fibrillation (HCC)  New onset type 2 diabetes mellitus (HCC) - Plan: POCT glycosylated hemoglobin (Hb A1C)  Hypercoagulable state due to persistent atrial fibrillation (HCC)  History of ischemic bowel disease  Need for influenza vaccination - Plan: Flu Vaccine Trivalent High Dose (Fluad)  Need for vaccination for Strep pneumoniae - Plan: Pneumococcal conjugate vaccine 20-valent (Prevnar 20)    Discussed aerobic activity at least 5 days/week; proper sunscreen use reviewed; healthy diet and alcohol recommendations (less than or equal to 2 drinks/day) reviewed; Immunization recommendations discussed.  Colonoscopy recommendations reviewed.   Medicare Attestation I have personally reviewed: The patient's medical and social history Their use of alcohol, tobacco or illicit drugs Their current medications and supplements The patient's functional ability including ADLs,fall risks, home safety risks, cognitive, and hearing and visual impairment Diet and physical activities Evidence for depression or mood disorders  The patient's weight, height, and BMI have been recorded in the chart.  I have made referrals, counseling, and provided education to the patient based on review of the above and I have provided the patient with a written personalized care  plan for preventive services.   I discussed the diagnosis of diabetes with him explaining that we might possibly place him on an SGLT and he will discuss that with cardiology.  Recommended he go to American diabetes Association website to get more information on this.  Since he has early end of the process of this I do not feel the need to put him on a glucose monitor but we will follow-up in 4 months and answer his further questions.  He was comfortable with that approach.  Explained the benefits of diabetes and for heart protection.  He will continue be followed by GI  and will see ophthalmology.  He is still actively seeing cardiology.  Sharlot Gowda, MD   08/14/2023

## 2023-08-14 NOTE — Telephone Encounter (Signed)
Patient is calling to speak with Dr. Jimmey Ralph or nurse

## 2023-08-14 NOTE — Patient Instructions (Signed)
Go to the American diabetes Association website to learn more about diet

## 2023-08-20 ENCOUNTER — Ambulatory Visit (HOSPITAL_COMMUNITY): Payer: Medicare HMO | Admitting: Physician Assistant

## 2023-08-21 ENCOUNTER — Other Ambulatory Visit (HOSPITAL_COMMUNITY): Payer: Medicare HMO

## 2023-08-25 ENCOUNTER — Other Ambulatory Visit: Payer: Medicare HMO

## 2023-08-27 NOTE — OR Nursing (Addendum)
Called patient with pre-procedure instructions for tomorrow.   Patient informed of:   Time to arrive for procedure. 1215 Remain NPO past midnight.  Must have a ride home and a responsible adult to remain with them for 24 ours post procedure.  Confirmed blood thinner. Confirmed no breaks in taking blood thinner for 3+ weeks prior to procedure. Confirmed patient stopped all GLP-1s and GLP-2s for at least one week before procedure.   Left a message for patient regarding above information. Instructed to call back with any questions.   **Patient called back and confirmed all above info.

## 2023-08-28 ENCOUNTER — Other Ambulatory Visit: Payer: Self-pay

## 2023-08-28 ENCOUNTER — Ambulatory Visit (HOSPITAL_COMMUNITY): Payer: Medicare HMO | Admitting: Anesthesiology

## 2023-08-28 ENCOUNTER — Encounter (HOSPITAL_COMMUNITY)
Admission: RE | Disposition: A | Discharge status: Home / Self Care | Payer: Self-pay | Source: Home / Self Care | Attending: Cardiovascular Disease

## 2023-08-28 ENCOUNTER — Encounter (HOSPITAL_COMMUNITY): Payer: Self-pay | Admitting: Cardiovascular Disease

## 2023-08-28 ENCOUNTER — Ambulatory Visit (HOSPITAL_COMMUNITY)
Admission: RE | Admit: 2023-08-28 | Discharge: 2023-08-28 | Disposition: A | Discharge status: Home / Self Care | Payer: Medicare HMO | Attending: Cardiovascular Disease | Admitting: Cardiovascular Disease

## 2023-08-28 DIAGNOSIS — I4892 Unspecified atrial flutter: Secondary | ICD-10-CM

## 2023-08-28 DIAGNOSIS — I4891 Unspecified atrial fibrillation: Secondary | ICD-10-CM

## 2023-08-28 DIAGNOSIS — Z7901 Long term (current) use of anticoagulants: Secondary | ICD-10-CM | POA: Insufficient documentation

## 2023-08-28 DIAGNOSIS — D6869 Other thrombophilia: Secondary | ICD-10-CM | POA: Insufficient documentation

## 2023-08-28 DIAGNOSIS — Z79899 Other long term (current) drug therapy: Secondary | ICD-10-CM | POA: Diagnosis not present

## 2023-08-28 DIAGNOSIS — E785 Hyperlipidemia, unspecified: Secondary | ICD-10-CM | POA: Diagnosis not present

## 2023-08-28 DIAGNOSIS — Z01818 Encounter for other preprocedural examination: Secondary | ICD-10-CM

## 2023-08-28 DIAGNOSIS — I429 Cardiomyopathy, unspecified: Secondary | ICD-10-CM | POA: Insufficient documentation

## 2023-08-28 DIAGNOSIS — I5022 Chronic systolic (congestive) heart failure: Secondary | ICD-10-CM | POA: Diagnosis not present

## 2023-08-28 DIAGNOSIS — Z87891 Personal history of nicotine dependence: Secondary | ICD-10-CM | POA: Insufficient documentation

## 2023-08-28 DIAGNOSIS — I110 Hypertensive heart disease with heart failure: Secondary | ICD-10-CM | POA: Insufficient documentation

## 2023-08-28 DIAGNOSIS — I4819 Other persistent atrial fibrillation: Secondary | ICD-10-CM | POA: Diagnosis not present

## 2023-08-28 HISTORY — PX: CARDIOVERSION: SHX1299

## 2023-08-28 SURGERY — CARDIOVERSION
Anesthesia: General

## 2023-08-28 MED ORDER — PROPOFOL 10 MG/ML IV BOLUS
INTRAVENOUS | Status: DC | PRN
  Administered 2023-08-28: 50 mg via INTRAVENOUS

## 2023-08-28 MED ORDER — LIDOCAINE 2% (20 MG/ML) 5 ML SYRINGE
INTRAMUSCULAR | Status: DC | PRN
  Administered 2023-08-28: 50 mg via INTRAVENOUS

## 2023-08-28 SURGICAL SUPPLY — 1 items: PAD DEFIB RADIO PHYSIO CONN (PAD) ×2 IMPLANT

## 2023-08-28 NOTE — CV Procedure (Signed)
Electrical Cardioversion Procedure Note Gerald [name] [id number] [birth year]  Procedure: Electrical Cardioversion Indications:  Atrial Flutter  Procedure Details Consent: Risks of procedure as well as the alternatives and risks of each were explained to the (patient/caregiver).  Consent for procedure obtained. Time Out: Verified patient identification, verified procedure, site/side was marked, verified correct patient position, special equipment/implants available, medications/allergies/relevent history reviewed, required imaging and test results available.  Performed  Patient placed on cardiac monitor, pulse oximetry, supplemental oxygen as necessary.  Sedation given:  propofol Pacer pads placed anterior and posterior chest.  Cardioverted 1 time(s).  Cardioverted at 200J.  Evaluation Findings: Post procedure EKG shows: NSR Complications: None Patient did tolerate procedure well.   Chilton Si, MD 08/28/2023, 1:39 PM

## 2023-08-28 NOTE — Interval H&P Note (Signed)
History and Physical Interval Note:  08/28/2023 1:05 PM  Gerald [name]  has presented today for surgery, with the diagnosis of AFIB.  The various methods of treatment have been discussed with the patient and family. After consideration of risks, benefits and other options for treatment, the patient has consented to  Procedure(s): CARDIOVERSION (N/A) as a surgical intervention.  The patient's history has been reviewed, patient examined, no change in status, stable for surgery.  I have reviewed the patient's chart and labs.  Questions were answered to the patient's satisfaction.     Chilton Si, MD

## 2023-08-28 NOTE — Anesthesia Postprocedure Evaluation (Signed)
Anesthesia Post Note  Patient: Gerald [name]  Procedure(s) Performed: CARDIOVERSION     Patient location during evaluation: PACU Anesthesia Type: General Level of consciousness: awake and alert and oriented Pain management: pain level controlled Vital Signs Assessment: post-procedure vital signs reviewed and stable Respiratory status: spontaneous breathing, nonlabored ventilation and respiratory function stable Cardiovascular status: blood pressure returned to baseline and stable Postop Assessment: no apparent nausea or vomiting Anesthetic complications: no   There were no known notable events for this encounter.  Last Vitals:  Vitals:   08/28/23 1300 08/28/23 1344  BP: 115/76   Pulse: 90   Resp: 15   Temp:  36.8 C  SpO2: 96%     Last Pain:  Vitals:   08/28/23 1344  TempSrc: Temporal  PainSc: 0-No pain                 Prosper Paff A.

## 2023-08-28 NOTE — Transfer of Care (Signed)
Immediate Anesthesia Transfer of Care Note  Patient: Gerald [name]  Procedure(s) Performed: CARDIOVERSION  Patient Location: PACU  Anesthesia Type:MAC  Level of Consciousness: oriented, drowsy, and patient cooperative  Airway & Oxygen Therapy: Patient Spontanous Breathing and Patient connected to nasal cannula oxygen  Post-op Assessment: Report given to RN and Post -op Vital signs reviewed and stable  Post vital signs: Reviewed and stable  Last Vitals:  Vitals Value Taken Time  BP    Temp    Pulse 93 08/28/23 1324  Resp 23 08/28/23 1324  SpO2 96 % 08/28/23 1324  Vitals shown include unfiled device data.  Last Pain:  Vitals:   08/28/23 1231  TempSrc:   PainSc: 0-No pain         Complications: No notable events documented.

## 2023-08-28 NOTE — Anesthesia Preprocedure Evaluation (Signed)
Anesthesia Evaluation  Patient identified by MRN, date of birth, ID band Patient awake    Reviewed: Allergy & Precautions, NPO status , Patient's Chart, lab work & pertinent test results  History of Anesthesia Complications (+) PONV and history of anesthetic complications  Airway Mallampati: II       Dental no notable dental hx. (+) Teeth Intact, Caps, Dental Advisory Given   Pulmonary former smoker   Pulmonary exam normal breath sounds clear to auscultation       Cardiovascular Pt. on home beta blockers + dysrhythmias Atrial Fibrillation  Rhythm:Irregular Rate:Normal  On Amiodarone   Neuro/Psych negative neurological ROS  negative psych ROS   GI/Hepatic PUD,GERD  Medicated,,  Endo/Other  diabetes  Hyperlipidemia Pre Diabetes  Renal/GU   negative genitourinary   Musculoskeletal negative musculoskeletal ROS (+)    Abdominal   Peds  Hematology Xarelto therapy- last dose yesterday pm   Anesthesia Other Findings   Reproductive/Obstetrics                              Anesthesia Physical Anesthesia Plan  ASA: 3  Anesthesia Plan: General   Post-op Pain Management: Minimal or no pain anticipated   Induction: Intravenous  PONV Risk Score and Plan: 2 and Treatment may vary due to age or medical condition and TIVA  Airway Management Planned: Natural Airway and Mask  Additional Equipment: None  Intra-op Plan:   Post-operative Plan:   Informed Consent: I have reviewed the patients History and Physical, chart, labs and discussed the procedure including the risks, benefits and alternatives for the proposed anesthesia with the patient or authorized representative who has indicated his/her understanding and acceptance.     Dental advisory given  Plan Discussed with: CRNA and Anesthesiologist  Anesthesia Plan Comments:          Anesthesia Quick Evaluation

## 2023-08-29 ENCOUNTER — Encounter (HOSPITAL_COMMUNITY): Payer: Self-pay | Admitting: Cardiovascular Disease

## 2023-09-01 ENCOUNTER — Encounter: Payer: Self-pay | Admitting: Cardiology

## 2023-09-04 ENCOUNTER — Telehealth (HOSPITAL_COMMUNITY): Payer: Self-pay | Admitting: Emergency Medicine

## 2023-09-04 ENCOUNTER — Telehealth (INDEPENDENT_AMBULATORY_CARE_PROVIDER_SITE_OTHER): Payer: Medicare HMO | Admitting: Family Medicine

## 2023-09-04 ENCOUNTER — Encounter: Payer: Self-pay | Admitting: Family Medicine

## 2023-09-04 VITALS — Temp 97.8°F | Ht 72.0 in | Wt 190.0 lb

## 2023-09-04 DIAGNOSIS — I4819 Other persistent atrial fibrillation: Secondary | ICD-10-CM | POA: Diagnosis not present

## 2023-09-04 DIAGNOSIS — J0100 Acute maxillary sinusitis, unspecified: Secondary | ICD-10-CM

## 2023-09-04 MED ORDER — AMOXICILLIN-POT CLAVULANATE 875-125 MG PO TABS
1.0000 | ORAL_TABLET | Freq: Two times a day (BID) | ORAL | 0 refills | Status: DC
Start: 2023-09-04 — End: 2023-09-16

## 2023-09-04 MED ORDER — METOPROLOL SUCCINATE ER 50 MG PO TB24: 75.0000 mg | ORAL_TABLET | Freq: Every day | ORAL | 3 refills | Status: DC

## 2023-09-04 MED ORDER — METOPROLOL SUCCINATE ER 50 MG PO TB24: 75.0000 mg | ORAL_TABLET | Freq: Two times a day (BID) | ORAL | 3 refills | Status: DC

## 2023-09-04 NOTE — Telephone Encounter (Signed)
Reaching out to patient to offer assistance regarding upcoming cardiac imaging study; pt verbalizes understanding of appt date/time, parking situation and where to check in, pre-test NPO status and medications ordered, and verified current allergies; name and call back number provided for further questions should they arise Cayne Yom RN Navigator Cardiac Imaging [facility] [phone] office [phone] cell 

## 2023-09-04 NOTE — Telephone Encounter (Signed)
Attempted to call patient regarding upcoming cardiac CT appointment. °Left message on voicemail with name and callback number °Dwan Fennel RN Navigator Cardiac Imaging °[facility] °[phone] Office °[phone] Cell ° °

## 2023-09-04 NOTE — Progress Notes (Signed)
Subjective:    Patient ID: Gerald [name], male    DOB: Jun 28, 1956, 67 y.o.   MRN: [id number]  HPI Documentation for virtual audio and video telecommunications through [place] encounter: The patient was located at home. 2 patient identifiers used.  The provider was located in the office. The patient did consent to this visit and is aware of possible charges through their insurance for this visit. The other persons participating in this telemedicine service were none. Time spent on call was 5 minutes and in review of previous records >20 minutes total for counseling and coordination of care. This virtual service is not related to other E/M service within previous 7 days.  He states that last Tuesday he developed sinus congestion cough, some slight postnasal drainage that has continued but now also having a slight sinus headache but no fever or chills.  The postnasal drainage as well as nasal drainage is purulent.  He had a recent cardioversion that was not successful and is now scheduled for an ablation.  Review of Systems     Objective:    Physical Exam Alert and in no distress otherwise not examined       Assessment & Plan:  Acute non-recurrent maxillary sinusitis - Plan: amoxicillin-clavulanate (AUGMENTIN) 875-125 MG tablet  Persistent atrial fibrillation (HCC)   Symptoms are consistent with sinusitis and I will go ahead and treated.  He will call if not entirely better when he finishes the antibiotic

## 2023-09-08 ENCOUNTER — Encounter: Payer: Self-pay | Admitting: Cardiology

## 2023-09-08 ENCOUNTER — Ambulatory Visit (HOSPITAL_COMMUNITY)
Admission: RE | Admit: 2023-09-08 | Discharge: 2023-09-08 | Disposition: A | Discharge status: Home / Self Care | Payer: Medicare HMO | Source: Ambulatory Visit | Attending: Cardiology | Admitting: Cardiology

## 2023-09-08 ENCOUNTER — Ambulatory Visit (HOSPITAL_COMMUNITY): Payer: Medicare HMO

## 2023-09-08 ENCOUNTER — Encounter (HOSPITAL_COMMUNITY): Payer: Self-pay

## 2023-09-08 DIAGNOSIS — I4819 Other persistent atrial fibrillation: Secondary | ICD-10-CM | POA: Insufficient documentation

## 2023-09-08 MED ORDER — IOHEXOL 350 MG/ML SOLN
100.0000 mL | Freq: Once | INTRAVENOUS | Status: AC | PRN
  Administered 2023-09-08: 100 mL via INTRAVENOUS

## 2023-09-10 ENCOUNTER — Other Ambulatory Visit: Payer: Self-pay | Admitting: *Deleted

## 2023-09-10 DIAGNOSIS — Z01818 Encounter for other preprocedural examination: Secondary | ICD-10-CM

## 2023-09-10 DIAGNOSIS — I4819 Other persistent atrial fibrillation: Secondary | ICD-10-CM

## 2023-09-11 ENCOUNTER — Ambulatory Visit (HOSPITAL_COMMUNITY): Payer: Medicare HMO | Attending: Cardiology

## 2023-09-11 ENCOUNTER — Ambulatory Visit: Payer: Medicare HMO

## 2023-09-11 DIAGNOSIS — I4819 Other persistent atrial fibrillation: Secondary | ICD-10-CM | POA: Insufficient documentation

## 2023-09-11 DIAGNOSIS — Z01818 Encounter for other preprocedural examination: Secondary | ICD-10-CM | POA: Diagnosis not present

## 2023-09-11 LAB — ECHOCARDIOGRAM LIMITED
Area-P 1/2: 4.17 cm2
Calc EF: 45.8 %
MV M vel: 4.68 m/s
MV Peak grad: 87.4 mm[Hg]
S' Lateral: 3.8 cm
Single Plane A2C EF: 46.2 %
Single Plane A4C EF: 44.5 %

## 2023-09-11 NOTE — Pre-Procedure Instructions (Signed)
Attempted to call patient regarding procedure instructions.  Left voicemail on the following items: Arrival time 0830 Nothing to eat or drink after midnight No meds AM of procedure Responsible person to drive you home and stay with you for 24 hrs  Have you missed any doses of anti-coagulant Xarelto- should be taken once a day, if you have missed any doses please let us know.

## 2023-09-12 ENCOUNTER — Other Ambulatory Visit: Payer: Self-pay

## 2023-09-12 ENCOUNTER — Ambulatory Visit (HOSPITAL_BASED_OUTPATIENT_CLINIC_OR_DEPARTMENT_OTHER): Payer: Medicare HMO | Admitting: Anesthesiology

## 2023-09-12 ENCOUNTER — Ambulatory Visit (HOSPITAL_COMMUNITY)
Admission: RE | Disposition: A | Discharge status: Home / Self Care | Payer: Medicare HMO | Source: Home / Self Care | Attending: Cardiology

## 2023-09-12 ENCOUNTER — Ambulatory Visit (HOSPITAL_COMMUNITY): Payer: Medicare HMO | Admitting: Anesthesiology

## 2023-09-12 ENCOUNTER — Ambulatory Visit (HOSPITAL_COMMUNITY)
Admission: RE | Admit: 2023-09-12 | Discharge: 2023-09-12 | Disposition: A | Discharge status: Home / Self Care | Payer: Medicare HMO | Attending: Cardiology | Admitting: Cardiology

## 2023-09-12 DIAGNOSIS — E785 Hyperlipidemia, unspecified: Secondary | ICD-10-CM | POA: Diagnosis not present

## 2023-09-12 DIAGNOSIS — Z8711 Personal history of peptic ulcer disease: Secondary | ICD-10-CM | POA: Insufficient documentation

## 2023-09-12 DIAGNOSIS — I4892 Unspecified atrial flutter: Secondary | ICD-10-CM | POA: Diagnosis not present

## 2023-09-12 DIAGNOSIS — I4819 Other persistent atrial fibrillation: Secondary | ICD-10-CM

## 2023-09-12 DIAGNOSIS — K589 Irritable bowel syndrome without diarrhea: Secondary | ICD-10-CM | POA: Insufficient documentation

## 2023-09-12 HISTORY — PX: ATRIAL FIBRILLATION ABLATION: EP1191

## 2023-09-12 LAB — BASIC METABOLIC PANEL
BUN/Creatinine Ratio: 12 (ref 10–24)
BUN: 14 mg/dL (ref 8–27)
CO2: 27 mmol/L (ref 20–29)
Calcium: 10.2 mg/dL (ref 8.6–10.2)
Chloride: 101 mmol/L (ref 96–106)
Creatinine, Ser: 1.18 mg/dL (ref 0.76–1.27)
Glucose: 107 mg/dL — ABNORMAL HIGH (ref 70–99)
Potassium: 5.8 mmol/L (ref 3.5–5.2)
Sodium: 139 mmol/L (ref 134–144)
eGFR: 68 mL/min/{1.73_m2} (ref >59)

## 2023-09-12 LAB — CBC
Hematocrit: 44.4 % (ref 37.5–51.0)
Hemoglobin: 14.7 g/dL (ref 13.0–17.7)
MCH: 33.5 pg — ABNORMAL HIGH (ref 26.6–33.0)
MCHC: 33.1 g/dL (ref 31.5–35.7)
MCV: 101 fL — ABNORMAL HIGH (ref 79–97)
Platelets: 378 10*3/uL (ref 150–450)
RBC: 4.39 x10E6/uL (ref 4.14–5.80)
RDW: 13.5 % (ref 11.6–15.4)
WBC: 9.2 10*3/uL (ref 3.4–10.8)

## 2023-09-12 LAB — POCT I-STAT, CHEM 8
BUN: 19 mg/dL (ref 8–23)
Calcium, Ion: 1.23 mmol/L (ref 1.15–1.40)
Chloride: 104 mmol/L (ref 98–111)
Creatinine, Ser: 1.3 mg/dL — ABNORMAL HIGH (ref 0.61–1.24)
Glucose, Bld: 111 mg/dL — ABNORMAL HIGH (ref 70–99)
HCT: 40 % (ref 39.0–52.0)
Hemoglobin: 13.6 g/dL (ref 13.0–17.0)
Potassium: 4.9 mmol/L (ref 3.5–5.1)
Sodium: 140 mmol/L (ref 135–145)
TCO2: 26 mmol/L (ref 22–32)

## 2023-09-12 SURGERY — ATRIAL FIBRILLATION ABLATION
Anesthesia: General

## 2023-09-12 MED ORDER — METOPROLOL SUCCINATE ER 50 MG PO TB24: 50.0000 mg | ORAL_TABLET | Freq: Every day | ORAL | 1 refills | Status: DC

## 2023-09-12 MED ORDER — AMIODARONE HCL 200 MG PO TABS: 200.0000 mg | ORAL_TABLET | Freq: Every day | ORAL | 0 refills | Status: DC

## 2023-09-12 MED ORDER — HEPARIN (PORCINE) IN NACL 1000-0.9 UT/500ML-% IV SOLN
INTRAVENOUS | Status: DC | PRN
  Administered 2023-09-12 (×3): 500 mL

## 2023-09-12 MED ORDER — ATROPINE SULFATE 1 MG/10ML IJ SOSY
PREFILLED_SYRINGE | INTRAMUSCULAR | Status: DC | PRN
  Administered 2023-09-12: 1 mg via INTRAVENOUS

## 2023-09-12 MED ORDER — MIDAZOLAM HCL 5 MG/5ML IJ SOLN
INTRAMUSCULAR | Status: AC
  Filled 2023-09-12: qty 5

## 2023-09-12 MED ORDER — SUGAMMADEX SODIUM 200 MG/2ML IV SOLN
INTRAVENOUS | Status: DC | PRN
  Administered 2023-09-12: 200 mg via INTRAVENOUS

## 2023-09-12 MED ORDER — SODIUM CHLORIDE 0.9% FLUSH: 3.0000 mL | INTRAVENOUS | Status: DC | PRN

## 2023-09-12 MED ORDER — ROCURONIUM BROMIDE 10 MG/ML (PF) SYRINGE
PREFILLED_SYRINGE | INTRAVENOUS | Status: DC | PRN
  Administered 2023-09-12 (×2): 15 mg via INTRAVENOUS
  Administered 2023-09-12: 70 mg via INTRAVENOUS

## 2023-09-12 MED ORDER — SODIUM CHLORIDE 0.9% FLUSH
3.0000 mL | Freq: Two times a day (BID) | INTRAVENOUS | Status: DC
Start: 2023-09-12 — End: 2023-09-12

## 2023-09-12 MED ORDER — PHENYLEPHRINE HCL-NACL 20-0.9 MG/250ML-% IV SOLN
INTRAVENOUS | Status: DC | PRN
  Administered 2023-09-12: 20 ug/min via INTRAVENOUS

## 2023-09-12 MED ORDER — ONDANSETRON HCL 4 MG/2ML IJ SOLN
INTRAMUSCULAR | Status: DC | PRN
  Administered 2023-09-12: 4 mg via INTRAVENOUS

## 2023-09-12 MED ORDER — PHENYLEPHRINE 80 MCG/ML (10ML) SYRINGE FOR IV PUSH (FOR BLOOD PRESSURE SUPPORT)
PREFILLED_SYRINGE | INTRAVENOUS | Status: DC | PRN
  Administered 2023-09-12 (×2): 120 ug via INTRAVENOUS
  Administered 2023-09-12: 160 ug via INTRAVENOUS
  Administered 2023-09-12 (×2): 80 ug via INTRAVENOUS
  Administered 2023-09-12 (×2): 160 ug via INTRAVENOUS

## 2023-09-12 MED ORDER — CEFAZOLIN SODIUM-DEXTROSE 1-4 GM/50ML-% IV SOLN
INTRAVENOUS | Status: DC | PRN
  Administered 2023-09-12: 2 g via INTRAVENOUS

## 2023-09-12 MED ORDER — LIDOCAINE 2% (20 MG/ML) 5 ML SYRINGE
INTRAMUSCULAR | Status: DC | PRN
  Administered 2023-09-12: 80 mg via INTRAVENOUS

## 2023-09-12 MED ORDER — ACETAMINOPHEN 325 MG PO TABS: 650.0000 mg | ORAL_TABLET | ORAL | Status: DC | PRN

## 2023-09-12 MED ORDER — FENTANYL CITRATE (PF) 100 MCG/2ML IJ SOLN
INTRAMUSCULAR | Status: AC
  Filled 2023-09-12: qty 2

## 2023-09-12 MED ORDER — FENTANYL CITRATE (PF) 250 MCG/5ML IJ SOLN
INTRAMUSCULAR | Status: DC | PRN
  Administered 2023-09-12: 50 ug via INTRAVENOUS

## 2023-09-12 MED ORDER — ONDANSETRON HCL 4 MG/2ML IJ SOLN: 4.0000 mg | Freq: Four times a day (QID) | INTRAMUSCULAR | Status: DC | PRN

## 2023-09-12 MED ORDER — PROPOFOL 10 MG/ML IV BOLUS
INTRAVENOUS | Status: DC | PRN
  Administered 2023-09-12: 200 mg via INTRAVENOUS

## 2023-09-12 MED ORDER — PROTAMINE SULFATE 10 MG/ML IV SOLN
INTRAVENOUS | Status: DC | PRN
  Administered 2023-09-12 (×2): 10 mg via INTRAVENOUS
  Administered 2023-09-12: 15 mg via INTRAVENOUS

## 2023-09-12 MED ORDER — NITROGLYCERIN 1 MG/10 ML FOR IR/CATH LAB
INTRA_ARTERIAL | Status: AC
  Filled 2023-09-12: qty 20

## 2023-09-12 MED ORDER — MIDAZOLAM HCL 2 MG/2ML IJ SOLN
INTRAMUSCULAR | Status: DC | PRN
  Administered 2023-09-12: 2 mg via INTRAVENOUS

## 2023-09-12 MED ORDER — CEFAZOLIN SODIUM-DEXTROSE 2-4 GM/100ML-% IV SOLN
INTRAVENOUS | Status: AC
  Filled 2023-09-12: qty 100

## 2023-09-12 MED ORDER — HEPARIN SODIUM (PORCINE) 1000 UNIT/ML IJ SOLN
INTRAMUSCULAR | Status: DC | PRN
  Administered 2023-09-12: 1000 [IU] via INTRAVENOUS

## 2023-09-12 MED ORDER — ALBUTEROL SULFATE HFA 108 (90 BASE) MCG/ACT IN AERS
INHALATION_SPRAY | RESPIRATORY_TRACT | Status: DC | PRN
  Administered 2023-09-12 (×2): 2 via RESPIRATORY_TRACT

## 2023-09-12 MED ORDER — SODIUM CHLORIDE 0.9 % IV SOLN
250.0000 mL | INTRAVENOUS | Status: DC | PRN
Start: 2023-09-12 — End: 2023-09-12

## 2023-09-12 MED ORDER — ALBUTEROL SULFATE HFA 108 (90 BASE) MCG/ACT IN AERS
INHALATION_SPRAY | RESPIRATORY_TRACT | Status: AC
Start: 2023-09-12 — End: ?
  Filled 2023-09-12: qty 6.7

## 2023-09-12 MED ORDER — SODIUM CHLORIDE 0.9 % IV SOLN: INTRAVENOUS | Status: DC

## 2023-09-12 MED ORDER — PROPOFOL 500 MG/50ML IV EMUL
INTRAVENOUS | Status: DC | PRN
  Administered 2023-09-12: 50 ug/kg/min via INTRAVENOUS

## 2023-09-12 MED ORDER — DEXAMETHASONE SODIUM PHOSPHATE 10 MG/ML IJ SOLN
INTRAMUSCULAR | Status: DC | PRN
  Administered 2023-09-12: 10 mg via INTRAVENOUS

## 2023-09-12 MED ORDER — HEPARIN SODIUM (PORCINE) 1000 UNIT/ML IJ SOLN
INTRAMUSCULAR | Status: DC | PRN
  Administered 2023-09-12: 1000 [IU] via INTRAVENOUS
  Administered 2023-09-12: 14000 [IU] via INTRAVENOUS
  Administered 2023-09-12: 1000 [IU] via INTRAVENOUS

## 2023-09-12 SURGICAL SUPPLY — 25 items
BAG SNAP BAND KOVER 36X36 (MISCELLANEOUS) IMPLANT
BLANKET WARM UNDERBOD FULL ACC (MISCELLANEOUS) ×2 IMPLANT
CABLE PFA RX CATH CONN (CABLE) IMPLANT
CATH 8FR REPROCESSED SOUNDSTAR (CATHETERS) ×1 IMPLANT
CATH 8FR SOUNDSTAR REPROCESSED (CATHETERS) IMPLANT
CATH FARAWAVE ABLATION 31 (CATHETERS) IMPLANT
CATH OCTARAY 2.0 F 3-3-3-3-3 (CATHETERS) IMPLANT
CATH SMTCH THERMOCOOL SF DF (CATHETERS) IMPLANT
CATH WEB BI DIR CSDF CRV REPRO (CATHETERS) IMPLANT
CLOSURE PERCLOSE PROSTYLE (VASCULAR PRODUCTS) IMPLANT
COVER SWIFTLINK CONNECTOR (BAG) ×2 IMPLANT
DEVICE CLOSURE MYNXGRIP 6/7F (Vascular Products) IMPLANT
DILATOR VESSEL 38 20CM 16FR (INTRODUCER) IMPLANT
GUIDEWIRE INQWIRE 1.5J.035X260 (WIRE) IMPLANT
INQWIRE 1.5J .035X260CM (WIRE) ×1
PACK EP LATEX FREE (CUSTOM PROCEDURE TRAY) ×1
PACK EP LF (CUSTOM PROCEDURE TRAY) ×2 IMPLANT
PAD DEFIB RADIO PHYSIO CONN (PAD) ×2 IMPLANT
PATCH CARTO3 (PAD) IMPLANT
SHEATH FARADRIVE STEERABLE (SHEATH) IMPLANT
SHEATH PINNACLE 8F 10CM (SHEATH) IMPLANT
SHEATH PINNACLE 9F 10CM (SHEATH) IMPLANT
SHEATH PROBE COVER 6X72 (BAG) IMPLANT
SHEATH WIRE KIT BAYLIS SL1 (KITS) IMPLANT
TUBING SMART ABLATE COOLFLOW (TUBING) IMPLANT

## 2023-09-12 NOTE — Anesthesia Postprocedure Evaluation (Signed)
Anesthesia Post Note  Patient: Gerald [name]  Procedure(s) Performed: ATRIAL FIBRILLATION ABLATION     Patient location during evaluation: PACU Anesthesia Type: General Level of consciousness: awake and alert, oriented and patient cooperative Pain management: pain level controlled Vital Signs Assessment: post-procedure vital signs reviewed and stable Respiratory status: spontaneous breathing, nonlabored ventilation and respiratory function stable Cardiovascular status: blood pressure returned to baseline and stable Postop Assessment: no apparent nausea or vomiting Anesthetic complications: no   No notable events documented.  Last Vitals:  Vitals:   09/12/23 1450 09/12/23 1455  BP: 96/68 98/62  Pulse: 64 60  Resp: 12 (!) 0  Temp: 36.9 C   SpO2: 95% 96%    Last Pain:  Vitals:   09/12/23 1450  TempSrc: Axillary  PainSc:                  Lannie Fields

## 2023-09-12 NOTE — Anesthesia Procedure Notes (Signed)
Procedure Name: Intubation Date/Time: 09/12/2023 11:15 AM  Performed by: Randa Evens, CRNAPre-anesthesia Checklist: Patient identified, Emergency Drugs available, Suction available and Patient being monitored Patient Re-evaluated:Patient Re-evaluated prior to induction Oxygen Delivery Method: Circle System Utilized Preoxygenation: Pre-oxygenation with 100% oxygen Induction Type: IV induction Ventilation: Mask ventilation without difficulty Laryngoscope Size: Mac and 4 Grade View: Grade II Tube type: Oral Number of attempts: 1 Airway Equipment and Method: Stylet and Oral airway Placement Confirmation: ETT inserted through vocal cords under direct vision, positive ETCO2 and breath sounds checked- equal and bilateral Secured at: 22 cm Tube secured with: Tape Dental Injury: Teeth and Oropharynx as per pre-operative assessment

## 2023-09-12 NOTE — Anesthesia Preprocedure Evaluation (Addendum)
Anesthesia Evaluation  Patient identified by MRN, date of birth, ID band Patient awake    Reviewed: Allergy & Precautions, H&P , NPO status , Patient's Chart, lab work & pertinent test results, reviewed documented beta blocker date and time   Airway Mallampati: III  TM Distance: >3 FB Neck ROM: Full    Dental  (+) Teeth Intact, Dental Advisory Given   Pulmonary former smoker Snores at night, has never had sleep study    Pulmonary exam normal breath sounds clear to auscultation       Cardiovascular Normal cardiovascular exam+ dysrhythmias (xarelto LD last night) Atrial Fibrillation  Rhythm:Regular Rate:Normal     Neuro/Psych negative neurological ROS  negative psych ROS   GI/Hepatic Neg liver ROS, PUD,GERD  Controlled and Medicated,,Esophageal stricture   Endo/Other  diabetes    Renal/GU negative Renal ROS  negative genitourinary   Musculoskeletal negative musculoskeletal ROS (+)    Abdominal   Peds negative pediatric ROS (+)  Hematology negative hematology ROS (+)   Anesthesia Other Findings   Reproductive/Obstetrics negative OB ROS                             Anesthesia Physical Anesthesia Plan  ASA: 2  Anesthesia Plan: General   Post-op Pain Management:    Induction: Intravenous  PONV Risk Score and Plan: Ondansetron, Dexamethasone, Midazolam and Treatment may vary due to age or medical condition  Airway Management Planned: Oral ETT  Additional Equipment: None  Intra-op Plan:   Post-operative Plan: Extubation in OR  Informed Consent: I have reviewed the patients History and Physical, chart, labs and discussed the procedure including the risks, benefits and alternatives for the proposed anesthesia with the patient or authorized representative who has indicated his/her understanding and acceptance.     Dental advisory given  Plan Discussed with: CRNA  Anesthesia Plan  Comments:        Anesthesia Quick Evaluation

## 2023-09-12 NOTE — H&P (Signed)
Electrophysiology Office Note:   Date:  09/12/2023  ID:  Gerald [name], DOB February 12, 1956, MRN [id number]   Primary Cardiologist: None Electrophysiologist: Nobie Putnam, MD       History of Present Illness:   Gerald [name] is a 67 y.o. male with h/o esophageal stricture, peptic ulcer disease, IBS, and hyperlipidemia seen today for  for Electrophysiology evaluation of atrial fibrillation at the request of Dr. Sharlot Gowda.    He was seen in clinic on 08/04/2023.  Cardioversion was performed on/08/06/2023.  The next day he went and played golf.  Upon returning home from golf he checked his heart rate and saw that it was elevated again, distant with atrial fibrillation.  He remained in normal sinus rhythm for less than 24 hours. States that he mentally felt better being back in sinus rhythm. Disappointed regarding his quick recurrence. Otherwise continuing to report feeling well. No chest pain, shortness of breath, dizziness or lightheadedness.    Review of systems complete and found to be negative unless listed in HPI.   Interval: Underwent cardioversion and maintained sinus rhythm for 2 days. Has been back in atrial fibrillation. Had a cold and received 2 weeks of Augmentin. Doing better from this. No new or acute complaints. Presents today for ablation.   EP Information / Studies Reviewed:     EKG is ordered today. Personal review as below.   EKG Interpretation Date/Time:                  Monday August 11 2023 16:04:55 EDT Ventricular Rate:         115 PR Interval:                   QRS Duration:             120 QT Interval:                 352 QTC Calculation:486 R Axis:                         45   Text Interpretation:Atrial fibrillation with rapid ventricular response . Right bundle branch block When compared with ECG of 06-Aug-2023 10:15,back in atrial fibrillation. Confirmed by Nobie Putnam [phone]) on 08/11/2023 7:51:05 PM    Nuclear stress 06/27/2023: No ischemia or  infarction. LV perfusion is normal. LVEF mildly decreased.   Echocardiogram 06/30/2023: Mildly decreased LV function, LVEF 45 to 50%.  Global hypokinesis. Normal RV size and function. Mildly dilated left and right atria. Mild MR.  No other significant valvular disease mentioned.   Risk Assessment/Calculations:     CHA2DS2-VASc Score = 2   This indicates a 2.2% annual risk of stroke. The patient's score is based upon: CHF History: 1 HTN History: 0 Diabetes History: 0 Stroke History: 0 Vascular Disease History: 0 Age Score: 1 Gender Score: 0               Physical Exam:    Wt Readings from Last 3 Encounters:  09/12/23 86.2 kg  09/04/23 86.2 kg  08/28/23 86.2 kg   Temp Readings from Last 3 Encounters:  09/12/23 97.7 F (36.5 C) (Temporal)  09/04/23 97.8 F (36.6 C) (Tympanic)  08/28/23 98.6 F (37 C) (Temporal)   BP Readings from Last 3 Encounters:  09/12/23 117/66  09/08/23 112/86  08/28/23 120/75   Pulse Readings from Last 3 Encounters:  09/12/23 85  08/28/23 69  08/14/23 85  GEN: Well nourished, well developed in no acute distress NECK: No JVD; No carotid bruits CARDIAC: Irregularly irregular rhythm, tachycardic RESPIRATORY:  Clear to auscultation without rales, wheezing or rhonchi  ABDOMEN: Soft, non-tender, non-distended EXTREMITIES:  No edema; No deformity       Latest Ref Rng & Units 09/12/2023   10:23 AM 09/11/2023    9:56 AM 08/11/2023    8:24 AM  BMP  Glucose 70 - 99 mg/dL 601  093  235   BUN 8 - 23 mg/dL 19  14  18    Creatinine 0.61 - 1.24 mg/dL 5.73  2.20  2.54   BUN/Creat Ratio 10 - 24  12  16    Sodium 135 - 145 mmol/L 140  139  141   Potassium 3.5 - 5.1 mmol/L 4.9  5.8  5.1   Chloride 98 - 111 mmol/L 104  101  101   CO2 20 - 29 mmol/L  27  25   Calcium 8.6 - 10.2 mg/dL  27.0  9.5     ASSESSMENT AND PLAN:   Gerald [name] is a 67 y.o. male with h/o esophageal stricture, peptic ulcer disease, IBS, and hyperlipidemia seen today  for  for Electrophysiology evaluation of atrial fibrillation at the request of Dr. Sharlot Gowda.  He underwent cardioversion with early recurrence within 24 hours.  Echocardiogram performed in atrial fibrillation did show mildly reduced LV systolic function, LVEF 45 to 62%.   #.  Persistent atrial fibrillation: -Given that he had LV systolic dysfunction on echocardiogram, he was started on a rhythm control strategy by his general cardiology/A-fib clinic.   -Discussed treatment options today for AF including antiarrhythmic drug therapy and ablation. Discussed risks, recovery and likelihood of success with each treatment strategy. Risk, benefits, and alternatives to EP study and ablation for afib were discussed. These risks include but are not limited to stroke, bleeding, vascular damage, tamponade, perforation, damage to the esophagus, lungs, phrenic nerve and other structures, pulmonary vein stenosis, worsening renal function, coronary vasospasm and death.  Discussed potential need for repeat ablation procedures and antiarrhythmic drugs after an initial ablation. The patient understands these risk and wishes to proceed.  -Ablation today.  Nobie Putnam, MD

## 2023-09-12 NOTE — Transfer of Care (Signed)
Immediate Anesthesia Transfer of Care Note  Patient: Gerald [name]  Procedure(s) Performed: ATRIAL FIBRILLATION ABLATION  Patient Location: PACU  Anesthesia Type:General  Level of Consciousness: drowsy  Airway & Oxygen Therapy: Patient Spontanous Breathing  Post-op Assessment: Report given to RN  Post vital signs: Reviewed and stable  Last Vitals:  Vitals Value Taken Time  BP 103/73 09/12/23 1415  Temp    Pulse 64 09/12/23 1418  Resp 19 09/12/23 1418  SpO2 100 % 09/12/23 1418  Vitals shown include unfiled device data.  Last Pain:  Vitals:   09/12/23 0900  TempSrc: Temporal  PainSc:          Complications: No notable events documented.

## 2023-09-12 NOTE — Discharge Instructions (Addendum)
 Stop amiodarone in 1 month.  Cardiac Ablation, Care After  This sheet gives you information about how to care for yourself after your procedure. Your health care provider may also give you more specific instructions. If you have problems or questions, contact your health care provider. What can I expect after the procedure? After the procedure, it is common to have: Bruising around your puncture site. Tenderness around your puncture site. Skipped heartbeats. If you had an atrial fibrillation ablation, you may have atrial fibrillation during the first several months after your procedure.  Tiredness (fatigue).  Follow these instructions at home: Puncture site care  Follow instructions from your health care provider about how to take care of your puncture site. Make sure you: If present, leave stitches (sutures), skin glue, or adhesive strips in place. These skin closures may need to stay in place for up to 2 weeks. If adhesive strip edges start to loosen and curl up, you may trim the loose edges. Do not remove adhesive strips completely unless your health care provider tells you to do that. If a large square bandage is present, this may be removed 24 hours after surgery.  Check your puncture site every day for signs of infection. Check for: Redness, swelling, or pain. Fluid or blood. If your puncture site starts to bleed, lie down on your back, apply firm pressure to the area, and contact your health care provider. Warmth. Pus or a bad smell. A pea or small marble sized lump at the site is normal and can take up to three months to resolve.  Driving Do not drive for at least 4 days after your procedure or however long your health care provider recommends. (Do not resume driving if you have previously been instructed not to drive for other health reasons.) Do not drive or use heavy machinery while taking prescription pain medicine. Activity Avoid activities that take a lot of effort for at  least 7 days after your procedure. Do not lift anything that is heavier than 5 lb (4.5 kg) for one week.  No sexual activity for 1 week.  Return to your normal activities as told by your health care provider. Ask your health care provider what activities are safe for you. General instructions Take over-the-counter and prescription medicines only as told by your health care provider. Do not use any products that contain nicotine or tobacco, such as cigarettes and e-cigarettes. If you need help quitting, ask your health care provider. You may shower after 24 hours, but Do not take baths, swim, or use a hot tub for 1 week.  Do not drink alcohol for 24 hours after your procedure. Keep all follow-up visits as told by your health care provider. This is important. Contact a health care provider if: You have redness, mild swelling, or pain around your puncture site. You have fluid or blood coming from your puncture site that stops after applying firm pressure to the area. Your puncture site feels warm to the touch. You have pus or a bad smell coming from your puncture site. You have a fever. You have chest pain or discomfort that spreads to your neck, jaw, or arm. You have chest pain that is worse with lying on your back or taking a deep breath. You are sweating a lot. You feel nauseous. You have a fast or irregular heartbeat. You have shortness of breath. You are dizzy or light-headed and feel the need to lie down. You have pain or numbness in the arm  or leg closest to your puncture site. Get help right away if: Your puncture site suddenly swells. Your puncture site is bleeding and the bleeding does not stop after applying firm pressure to the area. These symptoms may represent a serious problem that is an emergency. Do not wait to see if the symptoms will go away. Get medical help right away. Call your local emergency services (911 in the [place] Do not drive yourself to the  hospital. Summary After the procedure, it is normal to have bruising and tenderness at the puncture site in your groin, neck, or forearm. Check your puncture site every day for signs of infection. Get help right away if your puncture site is bleeding and the bleeding does not stop after applying firm pressure to the area. This is a medical emergency. This information is not intended to replace advice given to you by your health care provider. Make sure you discuss any questions you have with your health care provider.

## 2023-09-12 NOTE — Progress Notes (Signed)
Patient wakes up abruptly and tries to sit up  trying to bend both knees on arrival to holding area. Settled down now. Coughed up small amount thick tan sputum.

## 2023-09-13 LAB — POCT ACTIVATED CLOTTING TIME
Activated Clotting Time: 342 s
Activated Clotting Time: 320 s

## 2023-09-15 ENCOUNTER — Encounter (HOSPITAL_COMMUNITY): Payer: Self-pay | Admitting: Cardiology

## 2023-09-15 ENCOUNTER — Ambulatory Visit: Payer: Medicare HMO | Admitting: Cardiology

## 2023-09-16 ENCOUNTER — Encounter: Payer: Self-pay | Admitting: Cardiology

## 2023-09-16 ENCOUNTER — Emergency Department (HOSPITAL_COMMUNITY)
Admission: EM | Admit: 2023-09-16 | Discharge: 2023-09-16 | Disposition: A | Discharge status: Home / Self Care | Payer: Medicare HMO | Attending: Emergency Medicine | Admitting: Emergency Medicine

## 2023-09-16 ENCOUNTER — Emergency Department (HOSPITAL_BASED_OUTPATIENT_CLINIC_OR_DEPARTMENT_OTHER): Payer: Medicare HMO

## 2023-09-16 ENCOUNTER — Ambulatory Visit: Payer: Medicare HMO | Attending: Cardiology | Admitting: Cardiology

## 2023-09-16 ENCOUNTER — Other Ambulatory Visit: Payer: Self-pay

## 2023-09-16 VITALS — BP 122/68 | HR 62 | Ht 72.0 in | Wt 197.0 lb

## 2023-09-16 DIAGNOSIS — I4891 Unspecified atrial fibrillation: Secondary | ICD-10-CM | POA: Diagnosis not present

## 2023-09-16 DIAGNOSIS — S301XXA Contusion of abdominal wall, initial encounter: Secondary | ICD-10-CM | POA: Insufficient documentation

## 2023-09-16 DIAGNOSIS — I4819 Other persistent atrial fibrillation: Secondary | ICD-10-CM | POA: Diagnosis not present

## 2023-09-16 DIAGNOSIS — I5022 Chronic systolic (congestive) heart failure: Secondary | ICD-10-CM

## 2023-09-16 DIAGNOSIS — R1032 Left lower quadrant pain: Secondary | ICD-10-CM

## 2023-09-16 DIAGNOSIS — Z7901 Long term (current) use of anticoagulants: Secondary | ICD-10-CM | POA: Insufficient documentation

## 2023-09-16 DIAGNOSIS — G8918 Other acute postprocedural pain: Secondary | ICD-10-CM

## 2023-09-16 DIAGNOSIS — Z79899 Other long term (current) drug therapy: Secondary | ICD-10-CM | POA: Diagnosis not present

## 2023-09-16 DIAGNOSIS — R1909 Other intra-abdominal and pelvic swelling, mass and lump: Secondary | ICD-10-CM | POA: Insufficient documentation

## 2023-09-16 DIAGNOSIS — D6869 Other thrombophilia: Secondary | ICD-10-CM | POA: Diagnosis not present

## 2023-09-16 DIAGNOSIS — X58XXXA Exposure to other specified factors, initial encounter: Secondary | ICD-10-CM | POA: Insufficient documentation

## 2023-09-16 DIAGNOSIS — I724 Aneurysm of artery of lower extremity: Secondary | ICD-10-CM | POA: Diagnosis not present

## 2023-09-16 NOTE — ED Provider Notes (Signed)
[place] EMERGENCY DEPARTMENT AT [facility] Provider Note   CSN: [id number] Arrival date & time: 09/16/23  1529     History  No chief complaint on file.   Gerald [name] is a 67 y.o. male.  Six 58-year-old male presents with swelling and bruising to his left groin.  Patient had ablation for A-fib done 3 days ago.  Saw his cardiologist today for follow-up.  Concern for possible pseudoaneurysm.  Denies any distal numbness or tingling to his left foot.  Sent here for ultrasound      Home Medications Prior to Admission medications   Medication Sig Start Date End Date Taking? Authorizing Provider  amiodarone (PACERONE) 200 MG tablet Take 1 tablet (200 mg total) by mouth daily. 09/12/23 10/12/23  Nobie Putnam, MD  amoxicillin-clavulanate (AUGMENTIN) 875-125 MG tablet Take 1 tablet by mouth 2 (two) times daily. Patient not taking: Reported on 09/16/2023 09/04/23   Ronnald Nian, MD  esomeprazole (NEXIUM) 40 MG capsule Take 1 capsule (40 mg total) by mouth daily. Patient taking differently: Take 40 mg by mouth every other day. 08/14/23   Ronnald Nian, MD  metoprolol succinate (TOPROL-XL) 50 MG 24 hr tablet Take 1 tablet (50 mg total) by mouth daily. Take with or immediately following a meal. 09/12/23 03/10/24  Nobie Putnam, MD  Multiple Vitamins-Minerals (EYE VITAMINS & MINERALS PO) Take 1 capsule by mouth in the morning and at bedtime. Focus Select [employer]reds    [provider]  Multiple Vitamins-Minerals (MULTIVITAMIN WITH MINERALS) tablet Take 1 tablet by mouth daily. For Men 50+    [provider]  Pseudoeph-CPM-DM-APAP (TYLENOL COLD PO) Take 2 capsules by mouth every 4 (four) hours as needed (cold symptoms).    [provider]  rivaroxaban (XARELTO) 20 MG TABS tablet Take 1 tablet (20 mg total) by mouth daily with supper. 05/26/23   Fenton, Clint R, PA  rosuvastatin (CRESTOR) 20 MG tablet Take 1 tablet (20 mg total) by mouth daily. 08/14/23    Ronnald Nian, MD      Allergies    Perna canaliculata (green-lipped mussel)    Review of Systems   Review of Systems  All other systems reviewed and are negative.   Physical Exam Updated Vital Signs BP 128/63   Pulse (!) 55   Temp 98.4 F (36.9 C) (Oral)   Resp 18   SpO2 95%  Physical Exam Vitals and nursing note reviewed.  Constitutional:      General: He is not in acute distress.    Appearance: Normal appearance. He is well-developed. He is not toxic-appearing.  HENT:     Head: Normocephalic and atraumatic.  Eyes:     General: Lids are normal.     Conjunctiva/sclera: Conjunctivae normal.     Pupils: Pupils are equal, round, and reactive to light.  Neck:     Thyroid: No thyroid mass.     Trachea: No tracheal deviation.  Cardiovascular:     Rate and Rhythm: Normal rate and regular rhythm.     Heart sounds: Normal heart sounds. No murmur heard.    No gallop.  Pulmonary:     Effort: Pulmonary effort is normal. No respiratory distress.     Breath sounds: Normal breath sounds. No stridor. No decreased breath sounds, wheezing, rhonchi or rales.  Abdominal:     General: There is no distension.     Palpations: Abdomen is soft.     Tenderness: There is no abdominal tenderness. There is  no rebound.  Genitourinary:   Musculoskeletal:        General: No tenderness. Normal range of motion.     Cervical back: Normal range of motion and neck supple.  Skin:    General: Skin is warm and dry.     Findings: No abrasion or rash.  Neurological:     Mental Status: He is alert and oriented to person, place, and time. Mental status is at baseline.     GCS: GCS eye subscore is 4. GCS verbal subscore is 5. GCS motor subscore is 6.     Cranial Nerves: Cranial nerves are intact. No cranial nerve deficit.     Sensory: No sensory deficit.     Motor: Motor function is intact.  Psychiatric:        Attention and Perception: Attention normal.        Speech: Speech normal.         Behavior: Behavior normal.    ED Results / Procedures / Treatments   Labs (all labs ordered are listed, but only abnormal results are displayed) Labs Reviewed - No data to display  EKG None  Radiology No results found.  Procedures Procedures    Medications Ordered in ED Medications - No data to display  ED Course/ Medical Decision Making/ A&P                                 Medical Decision Making  Patient had ultrasound of left groin which is negative for pseudoaneurysm.  No evidence of vascular emergency condition at this time.  Will discharge home        Final Clinical Impression(s) / ED Diagnoses Final diagnoses:  None    Rx / DC Orders ED Discharge Orders     None         Lorre Nick, MD 09/16/23 [phone]

## 2023-09-16 NOTE — ED Triage Notes (Signed)
Patient arrives for eval of bruising to L groin onset today after having ablation done on Friday. Denies injury since the procedure and states soreness has improved throughout the course of today.

## 2023-09-16 NOTE — Progress Notes (Signed)
  Electrophysiology Office Note:   Date:  09/16/2023  ID:  Gerald [name], DOB 1956/08/10, MRN [id number]  Primary Cardiologist: None Electrophysiologist: Nobie Putnam, MD      History of Present Illness:   Gerald [name] is a 67 y.o. male with h/o esophageal stricture, peptic ulcer disease, IBS, hyperlipidemia and atrial fibrillation who is seen today for post hospital follow up.    Patient underwent successful catheter ablation for atrial fibrillation on 09/12/2023.  He had no immediate complications and was discharged later that day.  He was doing relatively well until today when he noticed some tenderness in his left groin and bruising for the first time.  He states that up until today he felt like his groins were getting better each day with decreasing soreness.  His right groin has no tenderness or bruising.  He has tried to limit his activity and does not recall any strenuous activity or heavy lifting.  He does endorse some constipation which has required straining for bowel movements.  He also endorses some coughing since his procedure, as he states he was getting over a cold.  He is otherwise doing well with no chest pain, shortness of breath, lower extremity edema.  Review of systems complete and found to be negative unless listed in HPI.       Physical Exam:   VS:  BP 122/68   Pulse 62   Ht 6' (1.829 m)   Wt 197 lb (89.4 kg)   SpO2 96%   BMI 26.72 kg/m    Wt Readings from Last 3 Encounters:  09/16/23 197 lb (89.4 kg)  09/12/23 190 lb (86.2 kg)  09/04/23 190 lb (86.2 kg)     GEN: Well nourished, well developed in no acute distress NECK: No JVD; No carotid bruits CARDIAC: Normal rate and regular rhythm RESPIRATORY:  Clear to auscultation without rales, wheezing or rhonchi  GROIN: Left groin hematoma, tender to palpation, small area of ecchymosis. ABDOMEN: Soft, non-tender, non-distended EXTREMITIES:  No edema; No deformity   ASSESSMENT AND PLAN:   Gerald [name]  is a 67 y.o. male with h/o esophageal stricture, peptic ulcer disease, IBS, hyperlipidemia and atrial fibrillation who is seen today for post hospital follow up.    #L groin tenderness and bruising Patient underwent successful catheter ablation for atrial fibrillation on 09/12/2023.  He had been doing well until today when he developed new left groin tenderness and bruising.  I asked him to come into the clinic for groin check.  He had a hematoma of the left groin.  The late development, now postop day 4, could be secondary to coughing and/or straining with bowel movement.  He has adhered to activity restrictions.  Left groin was quite tender to palpation on exam.  The hematoma did regress with manual pressure but did not resolve completely.  I would like to have his left groin evaluated for pseudoaneurysm given his degree of tenderness.  I have arranged for him to go to the emergency room where he will get a left groin vascular ultrasound plus minus thrombin injection for pseudoaneurysm, if warranted.  Signed, Nobie Putnam, MD

## 2023-09-16 NOTE — Discharge Instructions (Signed)
Follow-up with your cardiologist as needed.  Your ultrasound today did not show any evidence of pseudoaneurysm

## 2023-09-16 NOTE — Progress Notes (Signed)
Left groin duplex study  has been completed. Refer to [facility] under chart review to view preliminary results.   09/16/2023  5:43 PM Baron Parmelee, Gerarda Gunther

## 2023-09-16 NOTE — Telephone Encounter (Signed)
Dr. Jimmey Ralph spoke with the patient. The patient is coming in today for his site to be checked.

## 2023-09-16 NOTE — Patient Instructions (Addendum)
Medication Instructions:  Your physician recommends that you continue on your current medications as directed. Please refer to the Current Medication list given to you today.  *If you need a refill on your cardiac medications before your next appointment, please call your pharmacy*  Testing/Procedures: Please go across the street to [facility] ER for an ultrasound of the groin   Follow-Up: At [facility], you and your health needs are our priority.  As part of our continuing mission to provide you with exceptional heart care, we have created designated Provider Care Teams.  These Care Teams include your primary Cardiologist (physician) and Advanced Practice Providers (APPs -  Physician Assistants and Nurse Practitioners) who all work together to provide you with the care you need, when you need it.  Your next appointment:   As scheduled

## 2023-09-17 ENCOUNTER — Other Ambulatory Visit (HOSPITAL_COMMUNITY): Payer: Self-pay | Admitting: Physician Assistant

## 2023-09-17 DIAGNOSIS — I4819 Other persistent atrial fibrillation: Secondary | ICD-10-CM

## 2023-09-17 MED FILL — Cefazolin Sodium-Dextrose IV Solution 2 GM/100ML-4%: INTRAVENOUS | Qty: 100 | Status: AC

## 2023-09-17 MED FILL — Fentanyl Citrate Preservative Free (PF) Inj 100 MCG/2ML: INTRAMUSCULAR | Qty: 1 | Status: AC

## 2023-09-17 MED FILL — Midazolam HCl Inj 5 MG/5ML (Base Equivalent): INTRAMUSCULAR | Qty: 2 | Status: AC

## 2023-10-03 DIAGNOSIS — R69 Illness, unspecified: Secondary | ICD-10-CM | POA: Diagnosis not present

## 2023-10-06 ENCOUNTER — Telehealth: Payer: Self-pay

## 2023-10-06 NOTE — Telephone Encounter (Signed)
Transition Care Management Unsuccessful Follow-up Telephone Call  Date of discharge and from where:  [facility]  Attempts:  1st Attempt  Reason for unsuccessful TCM follow-up call:  No answer/busy   Lenard Forth [place]  [facility], [facility] Guide, Phone: [phone] Website: [email or web address]

## 2023-10-07 ENCOUNTER — Telehealth: Payer: Self-pay

## 2023-10-07 NOTE — Telephone Encounter (Signed)
Transition Care Management Follow-up Telephone Call Date of discharge and from where: [name] How have you been since you were released from the hospital? Patient has bee following up with providers Any questions or concerns? No  Items Reviewed: Did the pt receive and understand the discharge instructions provided? Yes  Medications obtained and verified? Yes  Other? No  Any new allergies since your discharge? No  Dietary orders reviewed? No Do you have support at home? Yes     Follow up appointments reviewed:  PCP Hospital f/u appt confirmed? No  Scheduled to see  on  @ . Specialist Hospital f/u appt confirmed? Yes  Scheduled to see  on @ . Are transportation arrangements needed? No  If their condition worsens, is the pt aware to call PCP or go to the Emergency Dept.? Yes Was the patient provided with contact information for the PCP's office or ED? Yes Was to pt encouraged to call back with questions or concerns? Yes

## 2023-10-13 ENCOUNTER — Encounter (HOSPITAL_COMMUNITY): Payer: Self-pay | Admitting: Physician Assistant

## 2023-10-13 ENCOUNTER — Ambulatory Visit (HOSPITAL_COMMUNITY)
Admission: RE | Admit: 2023-10-13 | Discharge: 2023-10-13 | Disposition: A | Discharge status: Home / Self Care | Payer: Medicare HMO | Source: Ambulatory Visit | Attending: Physician Assistant | Admitting: Physician Assistant

## 2023-10-13 VITALS — BP 132/78 | HR 64 | Ht 72.0 in | Wt 201.6 lb

## 2023-10-13 DIAGNOSIS — E785 Hyperlipidemia, unspecified: Secondary | ICD-10-CM | POA: Insufficient documentation

## 2023-10-13 DIAGNOSIS — I5020 Unspecified systolic (congestive) heart failure: Secondary | ICD-10-CM | POA: Diagnosis not present

## 2023-10-13 DIAGNOSIS — I4510 Unspecified right bundle-branch block: Secondary | ICD-10-CM | POA: Diagnosis not present

## 2023-10-13 DIAGNOSIS — I4819 Other persistent atrial fibrillation: Secondary | ICD-10-CM | POA: Insufficient documentation

## 2023-10-13 DIAGNOSIS — I4892 Unspecified atrial flutter: Secondary | ICD-10-CM | POA: Diagnosis not present

## 2023-10-13 DIAGNOSIS — Z7901 Long term (current) use of anticoagulants: Secondary | ICD-10-CM | POA: Insufficient documentation

## 2023-10-13 DIAGNOSIS — D6869 Other thrombophilia: Secondary | ICD-10-CM | POA: Insufficient documentation

## 2023-10-13 NOTE — Progress Notes (Signed)
Primary Care Physician: Ronnald Nian, MD Primary Cardiologist: None Electrophysiologist: Nobie Putnam, MD  Referring Physician: Dr [name] is a 67 y.o. male with a history of esophageal stricture, HLD, PUD, atrial flutter, atrial fibrillation who presents for follow up in the [facility]. The patient was initially diagnosed with atrial fibrillation incidentally at his PCP office on 05/22/23. Patient was unaware of his arrhythmia. He was started on Xarelto for a CHADS2VASC score of 2.  Patient underwent DCCV on 06/26/23 but reverted back to afib prior to discharge. He was loaded on amiodarone and underwent DCCV on 08/06/23 and 08/28/23. He was seen by Dr Jimmey Ralph and underwent afib and flutter ablation on 09/12/23. He did develop tenderness and swelling in his left groin site, sent to ED by Dr Jimmey Ralph for vascular US which was negative for pseudoaneurysm.   On follow up today, patient reports that he has done well since the ablation. He is in SR today and does not think he has had any interim episodes. His groin sites have healed. No bleeding issues on anticoagulation.   Today, he denies symptoms of palpitations, chest pain, shortness of breath, orthopnea, PND, lower extremity edema, dizziness, presyncope, syncope, daytime somnolence, bleeding, or neurologic sequela. The patient is tolerating medications without difficulties and is otherwise without complaint today.    Atrial Fibrillation Risk Factors:  he does not have symptoms or diagnosis of sleep apnea. he does not have a history of rheumatic fever. he does have a history of alcohol use. The patient does not have a history of early familial atrial fibrillation or other arrhythmias.   Atrial Fibrillation Management history:  Previous antiarrhythmic drugs: amiodarone  Previous cardioversions: 06/26/23, 08/06/23, 08/28/23 Previous ablations: 09/12/23 Anticoagulation history: Xarelto  ROS-  All systems are reviewed and negative except as per the HPI above.   Physical Exam: BP 132/78   Pulse 64   Ht 6' (1.829 m)   Wt 91.4 kg   BMI 27.34 kg/m   GEN: Well nourished, well developed in no acute distress NECK: No JVD; No carotid bruits CARDIAC: Regular rate and rhythm, no murmurs, rubs, gallops RESPIRATORY:  Clear to auscultation without rales, wheezing or rhonchi  ABDOMEN: Soft, non-tender, non-distended EXTREMITIES:  No edema; No deformity    Wt Readings from Last 3 Encounters:  10/13/23 91.4 kg  09/16/23 89.4 kg  09/12/23 86.2 kg     EKG today demonstrates  SR, RBBB Vent. rate 64 BPM PR interval 148 ms QRS duration 122 ms QT/QTcB 460/474 ms   Echo 06/30/23  1. Left ventricular ejection fraction, by estimation, is 45 to 50%. Left  ventricular ejection fraction by PLAX is 46 %. The left ventricle has  mildly decreased function. The left ventricle demonstrates global  hypokinesis. There is mild left ventricular hypertrophy. Left ventricular diastolic function could not be evaluated.   2. Right ventricular systolic function is normal. The right ventricular  size is normal. There is normal pulmonary artery systolic pressure. The  estimated right ventricular systolic pressure is 17.1 mmHg.   3. Left atrial size was mildly dilated.   4. Right atrial size was moderately dilated.   5. The mitral valve is abnormal. Mild mitral valve regurgitation.   6. The aortic valve is tricuspid. Aortic valve regurgitation is not  visualized.   7. The inferior vena cava is normal in size with greater than 50%  respiratory variability, suggesting right atrial pressure of 3 mmHg.  CHA2DS2-VASc Score = 2  The patient's score is based upon: CHF History: 1 HTN History: 0 Diabetes History: 0 Stroke History: 0 Vascular Disease History: 0 Age Score: 1 Gender Score: 0       ASSESSMENT AND PLAN: Persistent Atrial Fibrillation/atrial flutter The patient's CHA2DS2-VASc score  is 2, indicating a 2.2% annual risk of stroke.   S/p afib and flutter ablation 09/12/23, now off amiodarone  Patient appears to be maintaining SR Decrease Toprol to 50 mg daily (patient was taking BID) Continue Xarelto 20 mg daily with no missed doses for 3 months post ablation.  Secondary Hypercoagulable State (ICD10:  D68.69) The patient is at significant risk for stroke/thromboembolism based upon his CHA2DS2-VASc Score of 2.  Continue Rivaroxaban (Xarelto).   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   Follow up with Canary Brim as scheduled.     Jorja Loa PA-C [facility] [address] [place], [place] [address] [phone]

## 2023-10-23 ENCOUNTER — Encounter: Payer: Self-pay | Admitting: Cardiology

## 2023-11-11 DIAGNOSIS — M5431 Sciatica, right side: Secondary | ICD-10-CM | POA: Diagnosis not present

## 2023-11-11 DIAGNOSIS — M9903 Segmental and somatic dysfunction of lumbar region: Secondary | ICD-10-CM | POA: Diagnosis not present

## 2023-11-13 DIAGNOSIS — M5431 Sciatica, right side: Secondary | ICD-10-CM | POA: Diagnosis not present

## 2023-11-13 DIAGNOSIS — M9903 Segmental and somatic dysfunction of lumbar region: Secondary | ICD-10-CM | POA: Diagnosis not present

## 2023-11-17 DIAGNOSIS — M9903 Segmental and somatic dysfunction of lumbar region: Secondary | ICD-10-CM | POA: Diagnosis not present

## 2023-11-17 DIAGNOSIS — M5431 Sciatica, right side: Secondary | ICD-10-CM | POA: Diagnosis not present

## 2023-11-26 ENCOUNTER — Ambulatory Visit (INDEPENDENT_AMBULATORY_CARE_PROVIDER_SITE_OTHER): Payer: Medicare Other | Admitting: Family Medicine

## 2023-11-26 ENCOUNTER — Encounter: Payer: Self-pay | Admitting: Family Medicine

## 2023-11-26 VITALS — BP 124/76 | HR 87 | Temp 98.1°F | Wt 208.0 lb

## 2023-11-26 DIAGNOSIS — R635 Abnormal weight gain: Secondary | ICD-10-CM

## 2023-11-26 DIAGNOSIS — R944 Abnormal results of kidney function studies: Secondary | ICD-10-CM | POA: Diagnosis not present

## 2023-11-26 LAB — LIPID PANEL

## 2023-11-26 NOTE — Progress Notes (Signed)
   Subjective:    Patient ID: Gerald [name], male    DOB: [birth year], 68 y.o.   MRN: [id number]  HPI He is here for consult concerning weight gain.  He has noted that this is especially true in the last several months.  He states that he has not changed his normal workout routine or diet.  He actually has cut back on alcohol consumption and rarely drinks now.  He has had a recent ablation and is doing fine after that.  He has stopped taking his metoprolol.  He has had no hot or cold intolerance, change in bowel habits, abdominal pain bloating or BM changes.  No abdominal pain or other GI issues.  Psychologically things are going well.  His sleeping habits are unchanged.   Review of Systems     Objective:    Physical Exam Alert and in no distress. Tympanic membranes and canals are normal. Pharyngeal area is normal. Neck is supple without adenopathy or thyromegaly. Cardiac exam shows a regular sinus rhythm without murmurs or gallops. Lungs are clear to auscultation.  Abdominal exam shows no masses or tenderness with decreased bowel sounds.  DTRs were slightly diminished.  No skin changes noted.        Assessment & Plan:  Abnormal weight gain - Plan: CBC with Differential/Platelet, Comprehensive metabolic panel, Lipid panel, TSH, Testosterone, Cortisol Hard to say what is causing his weight gain but review of the record indicates that he is actually gained somewhere between 11 to 15 pounds since approximately a year ago.  I will touch bases after we get the blood work back.

## 2023-11-27 ENCOUNTER — Encounter: Payer: Self-pay | Admitting: Family Medicine

## 2023-11-27 LAB — COMPREHENSIVE METABOLIC PANEL
ALT: 26 IU/L (ref 0–44)
AST: 26 IU/L (ref 0–40)
Albumin: 4.6 g/dL (ref 3.9–4.9)
Alkaline Phosphatase: 82 IU/L (ref 44–121)
BUN/Creatinine Ratio: 13 (ref 10–24)
BUN: 13 mg/dL (ref 8–27)
Bilirubin Total: 0.3 mg/dL (ref 0.0–1.2)
CO2: 22 mmol/L (ref 20–29)
Calcium: 9.7 mg/dL (ref 8.6–10.2)
Chloride: 102 mmol/L (ref 96–106)
Creatinine, Ser: 0.99 mg/dL (ref 0.76–1.27)
Globulin, Total: 2.7 g/dL (ref 1.5–4.5)
Glucose: 98 mg/dL (ref 70–99)
Potassium: 4.5 mmol/L (ref 3.5–5.2)
Sodium: 141 mmol/L (ref 134–144)
Total Protein: 7.3 g/dL (ref 6.0–8.5)
eGFR: 83 mL/min/{1.73_m2} (ref >59)

## 2023-11-27 LAB — CBC WITH DIFFERENTIAL/PLATELET
Basophils Absolute: 0.1 10*3/uL (ref 0.0–0.2)
Basos: 1 %
EOS (ABSOLUTE): 0.2 10*3/uL (ref 0.0–0.4)
Eos: 2 %
Hematocrit: 41.2 % (ref 37.5–51.0)
Hemoglobin: 13.8 g/dL (ref 13.0–17.7)
Immature Grans (Abs): 0 10*3/uL (ref 0.0–0.1)
Immature Granulocytes: 0 %
Lymphocytes Absolute: 1.9 10*3/uL (ref 0.7–3.1)
Lymphs: 31 %
MCH: 33.5 pg — ABNORMAL HIGH (ref 26.6–33.0)
MCHC: 33.5 g/dL (ref 31.5–35.7)
MCV: 100 fL — ABNORMAL HIGH (ref 79–97)
Monocytes Absolute: 0.8 10*3/uL (ref 0.1–0.9)
Monocytes: 13 %
Neutrophils Absolute: 3.3 10*3/uL (ref 1.4–7.0)
Neutrophils: 53 %
Platelets: 285 10*3/uL (ref 150–450)
RBC: 4.12 x10E6/uL — ABNORMAL LOW (ref 4.14–5.80)
RDW: 12.7 % (ref 11.6–15.4)
WBC: 6.2 10*3/uL (ref 3.4–10.8)

## 2023-11-27 LAB — LIPID PANEL
Cholesterol, Total: 173 mg/dL (ref 100–199)
HDL: 38 mg/dL — ABNORMAL LOW (ref >39)
LDL CALC COMMENT:: 4.6 ratio (ref 0.0–5.0)
LDL Chol Calc (NIH): 62 mg/dL (ref 0–99)
Triglycerides: 478 mg/dL — ABNORMAL HIGH (ref 0–149)
VLDL Cholesterol Cal: 73 mg/dL — ABNORMAL HIGH (ref 5–40)

## 2023-11-27 LAB — TESTOSTERONE: Testosterone: 224 ng/dL — ABNORMAL LOW (ref 264–916)

## 2023-11-27 LAB — TSH: TSH: 5.27 u[IU]/mL — ABNORMAL HIGH (ref 0.450–4.500)

## 2023-11-28 LAB — CORTISOL: Cortisol: 6.7 ug/dL (ref 6.2–19.4)

## 2023-11-28 LAB — SPECIMEN STATUS REPORT

## 2023-11-28 NOTE — Progress Notes (Signed)
Have to check back on Monday. Lab personnel states it takes 24-48 hours for them to know if they can add.

## 2023-11-29 LAB — SPECIMEN STATUS REPORT

## 2023-11-29 LAB — THYROID PEROXIDASE ANTIBODY: Thyroperoxidase Ab SerPl-aCnc: 24 [IU]/mL (ref 0–34)

## 2023-12-09 ENCOUNTER — Encounter: Payer: Self-pay | Admitting: Cardiology

## 2023-12-10 DIAGNOSIS — M7711 Lateral epicondylitis, right elbow: Secondary | ICD-10-CM | POA: Diagnosis not present

## 2023-12-11 ENCOUNTER — Ambulatory Visit: Payer: Medicare Other | Admitting: Pulmonary Disease

## 2023-12-14 NOTE — Progress Notes (Signed)
 Electrophysiology Office Note:   Date:  12/15/2023  ID:  HIRVING ELLENBECKER, DOB 1956-09-05, MRN [id number]  Primary Cardiologist: None Electrophysiologist: Ardeen Kohler, MD      History of Present Illness:   Gerald [name] is a 68 y.o. male with h/o esophageal stricture, peptic ulcer disease, IBS, hyperlipidemia and persistent atrial fibrillation s/p catheter ablation on 09/12/23 who is seen today for follow up.    Discussed the use of AI scribe software for clinical note transcription with the patient, who gave verbal consent to proceed.  History of Present Illness   The patient, with a history of atrial fibrillation (AFib), presents for a routine follow-up post catheter ablation. He has been monitoring his heart rate using a Kardia Mobile device numerous times per day and reports no episodes of AFib since ablation. The patient has been off metoprolol  due to confusion regarding instructions and has noticed an increase in resting heart rate to around 90 bpm. He also reports an increase in heart rate to around 145 bpm during exercise. The patient is currently on Xarelto , a blood thinner, and has not experienced any significant bleeding events, although he does notice easy bruising. The patient is active, regularly going to the gym and playing golf, and is planning to travel to [place]  for a few weeks. He has recently noticed an unexplained weight gain of about 15 pounds and has improved diet and exercise in efforts to combat this. No new or acute complaints today.      Review of systems complete and found to be negative unless listed in HPI.      EP Information / Studies Reviewed:    EKG is ordered today. Personal review as below.  EKG Interpretation Date/Time:  Monday December 15 2023 14:26:01 EST Ventricular Rate:  97 PR Interval:  130 QRS Duration:  116 QT Interval:  386 QTC Calculation: 490 R Axis:   25  Text Interpretation: Normal sinus rhythm Incomplete right bundle branch  block When compared with ECG of 13-Oct-2023 10:04, Vent. rate has increased BY  33 BPM Confirmed by Ardeen Kohler [phone]) on 12/15/2023 2:42:48 PM   Echo 09/11/23:  1. Left ventricular ejection fraction, by estimation, is 45 to 50%. Left  ventricular ejection fraction by 2D MOD biplane is 45.8 %. The left  ventricle has mildly decreased function. The left ventricle demonstrates  global hypokinesis. Left ventricular  diastolic function could not be evaluated.   2. Right ventricular systolic function is normal. The right ventricular  size is normal. Tricuspid regurgitation signal is inadequate for assessing  PA pressure.   3. The mitral valve is abnormal. Mild mitral valve regurgitation.   4. The aortic valve is tricuspid.   5. The inferior vena cava is normal in size with greater than 50%  respiratory variability, suggesting right atrial pressure of 3 mmHg.   Nuclear Stress 06/27/23:   Findings are consistent with no ischemia and no infarction.   No ST deviation was noted.   LV perfusion is normal.   Left ventricular function is abnormal. Global function is mildly reduced. End diastolic cavity size is normal. End systolic cavity size is normal.   Prior study not available for comparison.   Normal Perfusion.  LVEF is mildly decreased but this may be less accurate in the setting of atrial fibrillation.  Risk Assessment/Calculations:    CHA2DS2-VASc Score = 2   This indicates a 2.2% annual risk of stroke. The patient's score is based upon: CHF History: 1 HTN History:  0 Diabetes History: 0 Stroke History: 0 Vascular Disease History: 0 Age Score: 1 Gender Score: 0             Physical Exam:   VS:  BP 132/80 (BP Location: Left Arm, Patient Position: Sitting, Cuff Size: Large)   Pulse 97   Ht 6' (1.829 m)   Wt 203 lb (92.1 kg)   SpO2 97%   BMI 27.53 kg/m    Wt Readings from Last 3 Encounters:  12/15/23 203 lb (92.1 kg)  11/26/23 208 lb (94.3 kg)  10/13/23 201 lb 9.6 oz  (91.4 kg)     GEN: Well nourished, well developed in no acute distress NECK: No JVD CARDIAC: Normal rate and regular rhythm RESPIRATORY:  Clear to auscultation without rales, wheezing or rhonchi  ABDOMEN: Soft, non-distended EXTREMITIES:  No edema; No deformity   ASSESSMENT AND PLAN:   Gerald [name] is a 68 y.o. male with h/o esophageal stricture, peptic ulcer disease, IBS, and hyperlipidemia seen today for  for Electrophysiology evaluation of atrial fibrillation at the request of Dr. Ron Cobbs.  He underwent cardioversion with early recurrence within 24 hours.  Echocardiogram performed in atrial fibrillation did show mildly reduced LV systolic function, LVEF 45 to 40%.   #.  Persistent atrial fibrillation: S/p catheter ablation on 09/12/23. -No known recurrences of atrial fibrillation.  He uses Kardia mobile device for monitoring regularly.   -He has noticed increase in resting heart rates after stopping metoprolol .  He stopped metoprolol  due to some confusion regarding his medication instructions.  -We will restart metoprolol  XL 50 mg once daily today.   #.  Secondary hypercoagulable state due to atrial fibrillation: -CHA2DS2-VASc score of 2. -Currently tolerating Xarelto  without issue.  He will continue this medication.   #. 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 -Now that he is maintaining sinus rhythm, we will repeat echocardiogram to assess LVEF.  We will do so when he returns from [place] . -Restart metoprolol  as above.    Signed, Ardeen Kohler, MD

## 2023-12-15 ENCOUNTER — Encounter: Payer: Self-pay | Admitting: Cardiology

## 2023-12-15 ENCOUNTER — Ambulatory Visit: Payer: Medicare Other | Attending: Cardiology | Admitting: Cardiology

## 2023-12-15 ENCOUNTER — Ambulatory Visit: Payer: Medicare HMO | Admitting: Pulmonary Disease

## 2023-12-15 VITALS — BP 132/80 | HR 97 | Ht 72.0 in | Wt 203.0 lb

## 2023-12-15 DIAGNOSIS — D6869 Other thrombophilia: Secondary | ICD-10-CM | POA: Diagnosis not present

## 2023-12-15 DIAGNOSIS — I5022 Chronic systolic (congestive) heart failure: Secondary | ICD-10-CM | POA: Diagnosis not present

## 2023-12-15 DIAGNOSIS — I4819 Other persistent atrial fibrillation: Secondary | ICD-10-CM

## 2023-12-15 MED ORDER — METOPROLOL SUCCINATE ER 50 MG PO TB24: 50.0000 mg | ORAL_TABLET | Freq: Every day | ORAL | 3 refills | Status: DC

## 2023-12-15 NOTE — Patient Instructions (Addendum)
 Medication Instructions:  1) RESTART your metoprolol  succinate  *If you need a refill on your cardiac medications before your next appointment, please call your pharmacy*  Follow-Up: At [facility], you and your health needs are our priority.  As part of our continuing mission to provide you with exceptional heart care, we have created designated Provider Care Teams.  These Care Teams include your primary Cardiologist (physician) and Advanced Practice Providers (APPs -  Physician Assistants and Nurse Practitioners) who all work together to provide you with the care you need, when you need it.  Your next appointment:   1 year  Provider:   EP APP

## 2023-12-16 ENCOUNTER — Other Ambulatory Visit: Payer: Self-pay

## 2023-12-16 DIAGNOSIS — I5022 Chronic systolic (congestive) heart failure: Secondary | ICD-10-CM

## 2023-12-19 ENCOUNTER — Encounter: Payer: Self-pay | Admitting: Cardiology

## 2023-12-30 ENCOUNTER — Encounter: Payer: Self-pay | Admitting: Internal Medicine

## 2024-01-08 ENCOUNTER — Ambulatory Visit (INDEPENDENT_AMBULATORY_CARE_PROVIDER_SITE_OTHER): Payer: Medicare HMO | Admitting: Family Medicine

## 2024-01-08 VITALS — BP 124/80 | HR 80 | Wt 210.8 lb

## 2024-01-08 DIAGNOSIS — R7989 Other specified abnormal findings of blood chemistry: Secondary | ICD-10-CM | POA: Diagnosis not present

## 2024-01-08 DIAGNOSIS — E291 Testicular hypofunction: Secondary | ICD-10-CM | POA: Diagnosis not present

## 2024-01-08 DIAGNOSIS — R635 Abnormal weight gain: Secondary | ICD-10-CM | POA: Diagnosis not present

## 2024-01-08 NOTE — Progress Notes (Addendum)
    Subjective:    Patient ID: Gerald [name], male    DOB: [birth year], 68 y.o.   MRN: [id number]  HPI He is here for recheck.  He was evaluated in January for weight gain.  Blood work did show slightly elevated TSH as well as low testosterone.  His energy level is good.  He has had no libido issues.  He is physically as active as he has ever been and has not made any dietary changes.   Review of Systems     Objective:    Physical Exam  Alert and in no distress otherwise not examined      Assessment & Plan:  Low testosterone - Plan: Testosterone  Elevated TSH - Plan: Thyroid Panel With TSH  Weight gain I explained that since that you have something that we can potentially further evaluate, I will recheck this to see if anything is changed.  3/7 the testosterone is low.  He did have difficulty with his several years ago and was treated.  I will place him back on AndroGel and have him come back here in 1 month for a blood testosterone level.  Discussed the fact that this might be the reason for his weight gain but it is hard to say.

## 2024-01-09 ENCOUNTER — Telehealth: Payer: Self-pay | Admitting: Internal Medicine

## 2024-01-09 DIAGNOSIS — E291 Testicular hypofunction: Secondary | ICD-10-CM

## 2024-01-09 LAB — THYROID PANEL WITH TSH
Free Thyroxine Index: 1.8 (ref 1.2–4.9)
T3 Uptake Ratio: 25 % (ref 24–39)
T4, Total: 7.2 ug/dL (ref 4.5–12.0)
TSH: 4.79 u[IU]/mL — ABNORMAL HIGH (ref 0.450–4.500)

## 2024-01-09 LAB — TESTOSTERONE: Testosterone: 159 ng/dL — ABNORMAL LOW (ref 264–916)

## 2024-01-09 MED ORDER — TESTOSTERONE 12.5 MG/ACT (1%) TD GEL: 2.0000 | Freq: Every day | TRANSDERMAL | 5 refills | Status: DC

## 2024-01-09 NOTE — Addendum Note (Signed)
 Addended by: Ronnald Nian on: 01/09/2024 09:33 AM   Modules accepted: Orders

## 2024-01-09 NOTE — Telephone Encounter (Signed)
 Copied from CRM [id number]. Topic: Clinical - Medication Question >> Jan 09, 2024 12:23 PM Franchot Heidelberg wrote: Reason for CRM: Gaya from [facility] called reporting that they can only dispense Testosterone 150 G, Anselm Jungling is asking for permission to change the Rx  Best contact  [facility] [id number] [place], [place] - [address] [facility] [address] [place] [address] Phone: [phone] Fax: [phone]  -Able to leave a message

## 2024-01-12 ENCOUNTER — Ambulatory Visit (HOSPITAL_COMMUNITY): Payer: Medicare Other | Attending: Cardiology

## 2024-01-12 DIAGNOSIS — I5022 Chronic systolic (congestive) heart failure: Secondary | ICD-10-CM | POA: Insufficient documentation

## 2024-01-12 LAB — ECHOCARDIOGRAM LIMITED
Area-P 1/2: 3.53 cm2
S' Lateral: 3.2 cm

## 2024-01-12 MED ORDER — TESTOSTERONE 12.5 MG/ACT (1%) TD GEL: 2.0000 | Freq: Every day | TRANSDERMAL | 2 refills | Status: DC

## 2024-01-12 NOTE — Telephone Encounter (Signed)
 Spoke with Anselm Jungling at pharmacy. The testosterone only comes in 150g not 75mg  so pt can get a 2 months supply at a time.  Ok'ed to be sent in for 150g with 2 refills. This would equal 6 month supply.

## 2024-01-13 ENCOUNTER — Other Ambulatory Visit: Payer: Self-pay | Admitting: Family Medicine

## 2024-01-13 DIAGNOSIS — E291 Testicular hypofunction: Secondary | ICD-10-CM

## 2024-01-15 ENCOUNTER — Encounter: Payer: Self-pay | Admitting: Cardiology

## 2024-02-02 DIAGNOSIS — H353131 Nonexudative age-related macular degeneration, bilateral, early dry stage: Secondary | ICD-10-CM | POA: Diagnosis not present

## 2024-02-02 DIAGNOSIS — H35371 Puckering of macula, right eye: Secondary | ICD-10-CM | POA: Diagnosis not present

## 2024-02-02 DIAGNOSIS — H59811 Chorioretinal scars after surgery for detachment, right eye: Secondary | ICD-10-CM | POA: Diagnosis not present

## 2024-02-02 DIAGNOSIS — H35363 Drusen (degenerative) of macula, bilateral: Secondary | ICD-10-CM | POA: Diagnosis not present

## 2024-02-05 ENCOUNTER — Telehealth: Payer: Self-pay | Admitting: Family Medicine

## 2024-02-05 NOTE — Telephone Encounter (Signed)
 Copied from CRM [id number]. Topic: Appointments - Appointment Info/Confirmation >> Feb 04, 2024  4:55 PM Shelah Lewandowsky wrote: Patient/patient representative is calling for information regarding an appointment. Patient is not happy about seeing another provider for his wellness visit- he would like to speak with Dr Susann Givens, also would prefer a phone call regarding things like this instead of an alert from Mychart- [phone]

## 2024-02-09 DIAGNOSIS — D225 Melanocytic nevi of trunk: Secondary | ICD-10-CM | POA: Diagnosis not present

## 2024-02-09 DIAGNOSIS — L821 Other seborrheic keratosis: Secondary | ICD-10-CM | POA: Diagnosis not present

## 2024-02-09 DIAGNOSIS — L905 Scar conditions and fibrosis of skin: Secondary | ICD-10-CM | POA: Diagnosis not present

## 2024-02-09 DIAGNOSIS — L723 Sebaceous cyst: Secondary | ICD-10-CM | POA: Diagnosis not present

## 2024-02-09 DIAGNOSIS — D2262 Melanocytic nevi of left upper limb, including shoulder: Secondary | ICD-10-CM | POA: Diagnosis not present

## 2024-02-09 DIAGNOSIS — L570 Actinic keratosis: Secondary | ICD-10-CM | POA: Diagnosis not present

## 2024-02-09 DIAGNOSIS — D2261 Melanocytic nevi of right upper limb, including shoulder: Secondary | ICD-10-CM | POA: Diagnosis not present

## 2024-02-09 DIAGNOSIS — Z85828 Personal history of other malignant neoplasm of skin: Secondary | ICD-10-CM | POA: Diagnosis not present

## 2024-02-09 DIAGNOSIS — D485 Neoplasm of uncertain behavior of skin: Secondary | ICD-10-CM | POA: Diagnosis not present

## 2024-02-19 ENCOUNTER — Ambulatory Visit: Admitting: Family Medicine

## 2024-03-04 ENCOUNTER — Ambulatory Visit (INDEPENDENT_AMBULATORY_CARE_PROVIDER_SITE_OTHER): Admitting: Family Medicine

## 2024-03-04 VITALS — BP 118/80 | HR 74 | Wt 213.2 lb

## 2024-03-04 DIAGNOSIS — M7711 Lateral epicondylitis, right elbow: Secondary | ICD-10-CM

## 2024-03-04 DIAGNOSIS — E291 Testicular hypofunction: Secondary | ICD-10-CM

## 2024-03-04 DIAGNOSIS — I4819 Other persistent atrial fibrillation: Secondary | ICD-10-CM | POA: Diagnosis not present

## 2024-03-04 NOTE — Progress Notes (Signed)
   Subjective:    Patient ID: Gerald [name], male    DOB: 11-06-1955, 68 y.o.   MRN: [id number]  HPI He is here for recheck.  He has been using testosterone  2 applications per day but cannot tell any difference. He also has had difficulty with right elbow pain and has apparently been getting injections in that area relatively frequently.  He does not play tennis but does play golf. There is also previous history of possible right TM perforation.  He apparently used a Q-tip did note bleeding and difficulty with hearing after that for a week or 2.  Now he is back to normal. He continues on Xarelto  for his underlying atrial fibrillation.  He has had an ablation and apparently since then has done well and has not noted any reoccurrence of the A-fib.  Review of Systems     Objective:    Physical Exam Exam of both tympanic membranes are normal.  He does have some wax that has been pushed back into the right canal. Cardiac exam shows a regular rhythm. Exam of the right elbow does show some tenderness over the lateral epicondyle.       Assessment & Plan:  Hypogonadism in male - Plan: Testosterone   Persistent atrial fibrillation (HCC) - History of ablation  Lateral epicondylitis of right elbow Discussed multiple injections for the epicondylitis is not ideal.  Recommended that he do the physical therapy that was given to him to help strengthen this and also potentially changes to grips on his clubs to make him a little wider to help with the same.  Mentioned coming in here and having Dr. Francina Irish evaluate this for possible PRP.

## 2024-03-05 LAB — TESTOSTERONE: Testosterone: 823 ng/dL (ref 264–916)

## 2024-03-07 ENCOUNTER — Encounter: Payer: Self-pay | Admitting: Family Medicine

## 2024-03-11 ENCOUNTER — Ambulatory Visit: Admitting: Family Medicine

## 2024-04-04 ENCOUNTER — Encounter: Payer: Self-pay | Admitting: Cardiology

## 2024-04-06 DIAGNOSIS — M25521 Pain in right elbow: Secondary | ICD-10-CM | POA: Diagnosis not present

## 2024-04-06 DIAGNOSIS — M65342 Trigger finger, left ring finger: Secondary | ICD-10-CM | POA: Diagnosis not present

## 2024-04-29 DIAGNOSIS — L304 Erythema intertrigo: Secondary | ICD-10-CM | POA: Diagnosis not present

## 2024-05-20 DIAGNOSIS — M720 Palmar fascial fibromatosis [Dupuytren]: Secondary | ICD-10-CM | POA: Diagnosis not present

## 2024-05-20 DIAGNOSIS — M65342 Trigger finger, left ring finger: Secondary | ICD-10-CM | POA: Diagnosis not present

## 2024-06-16 DIAGNOSIS — M25521 Pain in right elbow: Secondary | ICD-10-CM | POA: Diagnosis not present

## 2024-06-23 DIAGNOSIS — M720 Palmar fascial fibromatosis [Dupuytren]: Secondary | ICD-10-CM | POA: Diagnosis not present

## 2024-06-23 DIAGNOSIS — M65342 Trigger finger, left ring finger: Secondary | ICD-10-CM | POA: Diagnosis not present

## 2024-06-28 DIAGNOSIS — Z961 Presence of intraocular lens: Secondary | ICD-10-CM | POA: Diagnosis not present

## 2024-07-06 ENCOUNTER — Encounter (HOSPITAL_COMMUNITY): Payer: Self-pay | Admitting: Physician Assistant

## 2024-07-06 ENCOUNTER — Ambulatory Visit (HOSPITAL_COMMUNITY)
Admission: RE | Admit: 2024-07-06 | Discharge: 2024-07-06 | Disposition: A | Discharge status: Home / Self Care | Source: Ambulatory Visit | Attending: Physician Assistant | Admitting: Physician Assistant

## 2024-07-06 VITALS — BP 126/88 | HR 72 | Ht 72.0 in | Wt 206.6 lb

## 2024-07-06 DIAGNOSIS — I4819 Other persistent atrial fibrillation: Secondary | ICD-10-CM | POA: Diagnosis not present

## 2024-07-06 DIAGNOSIS — D6869 Other thrombophilia: Secondary | ICD-10-CM

## 2024-07-06 NOTE — Progress Notes (Signed)
 Primary Care Physician: Joyce Norleen BROCKS, MD Primary Cardiologist: None Electrophysiologist: Fonda Kitty, MD  Referring Physician: Dr Joyce Nancyann VEAR Dean is a 68 y.o. male with a history of esophageal stricture, HLD, PUD, atrial flutter, atrial fibrillation who presents for follow up in the [facility]. The patient was initially diagnosed with atrial fibrillation incidentally at his PCP office on 05/22/23. Patient was unaware of his arrhythmia. He was started on Xarelto  for stroke prevention.   Patient underwent DCCV on 06/26/23 but reverted back to afib prior to discharge. He was loaded on amiodarone  and underwent DCCV on 08/06/23 and 08/28/23. He was seen by Dr Kitty and underwent afib and flutter ablation on 09/12/23. He did develop tenderness and swelling in his left groin site, sent to ED by Dr Kitty for vascular US  which was negative for pseudoaneurysm.   Patient returns for follow up for atrial fibrillation. He reports that he has done well since his last visit with no interim symptoms of afib. He remains very active by playing golf and pickle ball regularly.   Today, he  denies symptoms of palpitations, chest pain, shortness of breath, orthopnea, PND, lower extremity edema, dizziness, presyncope, syncope, snoring, daytime somnolence, bleeding, or neurologic sequela. The patient is tolerating medications without difficulties and is otherwise without complaint today.    Atrial Fibrillation Risk Factors:  he does not have symptoms or diagnosis of sleep apnea. he does not have a history of rheumatic fever. he does have a history of alcohol use. The patient does not have a history of early familial atrial fibrillation or other arrhythmias.   Atrial Fibrillation Management history:  Previous antiarrhythmic drugs: amiodarone   Previous cardioversions: 06/26/23, 08/06/23, 08/28/23 Previous ablations: 09/12/23 Anticoagulation history: Xarelto   ROS- All  systems are reviewed and negative except as per the HPI above.   Physical Exam: BP 126/88   Pulse 72   Ht 6' (1.829 m)   Wt 93.7 kg   BMI 28.02 kg/m   GEN: Well nourished, well developed in no acute distress CARDIAC: Regular rate and rhythm, no murmurs, rubs, gallops RESPIRATORY:  Clear to auscultation without rales, wheezing or rhonchi  ABDOMEN: Soft, non-tender, non-distended EXTREMITIES:  No edema; No deformity    Wt Readings from Last 3 Encounters:  07/06/24 93.7 kg  03/04/24 96.7 kg  01/08/24 95.6 kg     EKG today demonstrates  SR, inc RBBB Vent. rate 72 BPM PR interval 142 ms QRS duration 118 ms QT/QTcB 388/424 ms   Echo 01/12/24  1. Left ventricular ejection fraction, by estimation, is 60 to 65%. The  left ventricle has normal function. Left ventricular diastolic parameters  were normal.   2. Right ventricular systolic function is normal. The right ventricular  size is normal.   3. The mitral valve is normal in structure. Mild mitral valve  regurgitation.   4. The aortic valve is tricuspid. Aortic valve regurgitation is trivial.  Aortic valve sclerosis is present, with no evidence of aortic valve  stenosis.   5. The inferior vena cava is normal in size with greater than 50%  respiratory variability, suggesting right atrial pressure of 3 mmHg.   Comparison(s): The left ventricular function has improved.    CHA2DS2-VASc Score = 1  The patient's score is based upon: CHF History: 0 (EF normalized) HTN History: 0 Diabetes History: 0 Stroke History: 0 Vascular Disease History: 0 Age Score: 1 Gender Score: 0       ASSESSMENT AND  PLAN: Persistent Atrial Fibrillation/atrial flutter (ICD10:  I48.19) The patient's CHA2DS2-VASc score is 1, indicating a 0.6% annual risk of stroke.   S/p afib and flutter ablation 09/12/23, now off amiodarone  Patient appears to be maintaining SR Continue Toprol  50 mg daily Not currently on anticoagulation with low CV score.    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  Fluid status appears stable today    Follow up with EP APP per recall.    Daril Kicks PA-C [facility] [address] [place], [place] [address] [phone]

## 2024-08-04 ENCOUNTER — Other Ambulatory Visit: Payer: Self-pay | Admitting: Family Medicine

## 2024-08-04 DIAGNOSIS — I700 Atherosclerosis of aorta: Secondary | ICD-10-CM

## 2024-08-08 ENCOUNTER — Other Ambulatory Visit: Payer: Self-pay | Admitting: Family Medicine

## 2024-08-08 DIAGNOSIS — I700 Atherosclerosis of aorta: Secondary | ICD-10-CM

## 2024-08-09 ENCOUNTER — Other Ambulatory Visit: Payer: Self-pay

## 2024-08-09 ENCOUNTER — Other Ambulatory Visit

## 2024-08-09 DIAGNOSIS — Z0000 Encounter for general adult medical examination without abnormal findings: Secondary | ICD-10-CM

## 2024-08-09 DIAGNOSIS — R7989 Other specified abnormal findings of blood chemistry: Secondary | ICD-10-CM | POA: Diagnosis not present

## 2024-08-09 DIAGNOSIS — E291 Testicular hypofunction: Secondary | ICD-10-CM

## 2024-08-09 LAB — LIPID PANEL

## 2024-08-10 ENCOUNTER — Ambulatory Visit: Payer: Self-pay | Admitting: Family Medicine

## 2024-08-10 LAB — CBC WITH DIFFERENTIAL/PLATELET
Basophils Absolute: 0 x10E3/uL (ref 0.0–0.2)
Basos: 1 %
EOS (ABSOLUTE): 0.1 x10E3/uL (ref 0.0–0.4)
Eos: 2 %
Hematocrit: 42.2 % (ref 37.5–51.0)
Hemoglobin: 14 g/dL (ref 13.0–17.7)
Immature Grans (Abs): 0 x10E3/uL (ref 0.0–0.1)
Immature Granulocytes: 0 %
Lymphocytes Absolute: 1.8 x10E3/uL (ref 0.7–3.1)
Lymphs: 30 %
MCH: 33.3 pg — ABNORMAL HIGH (ref 26.6–33.0)
MCHC: 33.2 g/dL (ref 31.5–35.7)
MCV: 101 fL — ABNORMAL HIGH (ref 79–97)
Monocytes Absolute: 0.5 x10E3/uL (ref 0.1–0.9)
Monocytes: 8 %
Neutrophils Absolute: 3.5 x10E3/uL (ref 1.4–7.0)
Neutrophils: 59 %
Platelets: 275 x10E3/uL (ref 150–450)
RBC: 4.2 x10E6/uL (ref 4.14–5.80)
RDW: 14 % (ref 11.6–15.4)
WBC: 6 x10E3/uL (ref 3.4–10.8)

## 2024-08-10 LAB — COMPREHENSIVE METABOLIC PANEL WITH GFR
ALT: 24 IU/L (ref 0–44)
AST: 23 IU/L (ref 0–40)
Albumin: 4.6 g/dL (ref 3.9–4.9)
Alkaline Phosphatase: 81 IU/L (ref 47–123)
BUN/Creatinine Ratio: 17 (ref 10–24)
BUN: 17 mg/dL (ref 8–27)
Bilirubin Total: 0.5 mg/dL (ref 0.0–1.2)
CO2: 22 mmol/L (ref 20–29)
Calcium: 9.7 mg/dL (ref 8.6–10.2)
Chloride: 101 mmol/L (ref 96–106)
Creatinine, Ser: 1.02 mg/dL (ref 0.76–1.27)
Globulin, Total: 2.5 g/dL (ref 1.5–4.5)
Glucose: 117 mg/dL — ABNORMAL HIGH (ref 70–99)
Potassium: 4.5 mmol/L (ref 3.5–5.2)
Sodium: 137 mmol/L (ref 134–144)
Total Protein: 7.1 g/dL (ref 6.0–8.5)
eGFR: 80 mL/min/1.73 (ref >59)

## 2024-08-10 LAB — LIPID PANEL
Cholesterol, Total: 155 mg/dL (ref 100–199)
HDL: 39 mg/dL — AB (ref >39)
LDL CALC COMMENT:: 4 ratio (ref 0.0–5.0)
LDL Chol Calc (NIH): 59 mg/dL (ref 0–99)
Triglycerides: 369 mg/dL — AB (ref 0–149)
VLDL Cholesterol Cal: 57 mg/dL — AB (ref 5–40)

## 2024-08-10 LAB — CORTISOL: Cortisol: 9.2 ug/dL (ref 6.2–19.4)

## 2024-08-10 LAB — TESTOSTERONE: Testosterone: 437 ng/dL (ref 264–916)

## 2024-08-10 LAB — PSA: Prostate Specific Ag, Serum: 0.3 ng/mL (ref 0.0–4.0)

## 2024-08-10 LAB — TSH: TSH: 1.54 u[IU]/mL (ref 0.450–4.500)

## 2024-08-12 ENCOUNTER — Ambulatory Visit: Payer: Medicare HMO | Admitting: Family Medicine

## 2024-08-12 ENCOUNTER — Encounter: Payer: Self-pay | Admitting: Family Medicine

## 2024-08-12 VITALS — BP 112/70 | HR 96 | Ht 73.0 in | Wt 207.0 lb

## 2024-08-12 DIAGNOSIS — Z8679 Personal history of other diseases of the circulatory system: Secondary | ICD-10-CM | POA: Insufficient documentation

## 2024-08-12 DIAGNOSIS — M48061 Spinal stenosis, lumbar region without neurogenic claudication: Secondary | ICD-10-CM

## 2024-08-12 DIAGNOSIS — E118 Type 2 diabetes mellitus with unspecified complications: Secondary | ICD-10-CM

## 2024-08-12 DIAGNOSIS — I4819 Other persistent atrial fibrillation: Secondary | ICD-10-CM | POA: Diagnosis not present

## 2024-08-12 DIAGNOSIS — Z23 Encounter for immunization: Secondary | ICD-10-CM | POA: Diagnosis not present

## 2024-08-12 DIAGNOSIS — E291 Testicular hypofunction: Secondary | ICD-10-CM

## 2024-08-12 DIAGNOSIS — M6530 Trigger finger, unspecified finger: Secondary | ICD-10-CM | POA: Diagnosis not present

## 2024-08-12 DIAGNOSIS — E781 Pure hyperglyceridemia: Secondary | ICD-10-CM

## 2024-08-12 DIAGNOSIS — I700 Atherosclerosis of aorta: Secondary | ICD-10-CM | POA: Diagnosis not present

## 2024-08-12 DIAGNOSIS — Z9841 Cataract extraction status, right eye: Secondary | ICD-10-CM

## 2024-08-12 DIAGNOSIS — K219 Gastro-esophageal reflux disease without esophagitis: Secondary | ICD-10-CM

## 2024-08-12 DIAGNOSIS — M4807 Spinal stenosis, lumbosacral region: Secondary | ICD-10-CM

## 2024-08-12 DIAGNOSIS — N5201 Erectile dysfunction due to arterial insufficiency: Secondary | ICD-10-CM

## 2024-08-12 DIAGNOSIS — Z860100 Personal history of colon polyps, unspecified: Secondary | ICD-10-CM

## 2024-08-12 DIAGNOSIS — Z0000 Encounter for general adult medical examination without abnormal findings: Secondary | ICD-10-CM | POA: Diagnosis not present

## 2024-08-12 DIAGNOSIS — E1136 Type 2 diabetes mellitus with diabetic cataract: Secondary | ICD-10-CM

## 2024-08-12 DIAGNOSIS — Z9889 Other specified postprocedural states: Secondary | ICD-10-CM

## 2024-08-12 MED ORDER — ROSUVASTATIN CALCIUM 20 MG PO TABS: 20.0000 mg | ORAL_TABLET | Freq: Every day | ORAL | 0 refills | Status: AC

## 2024-08-12 MED ORDER — TESTOSTERONE 12.5 MG/ACT (1%) TD GEL: 2.0000 | Freq: Every day | TRANSDERMAL | 2 refills | Status: AC

## 2024-08-12 MED ORDER — TADALAFIL 20 MG PO TABS: 20.0000 mg | ORAL_TABLET | ORAL | 1 refills | Status: AC | PRN

## 2024-08-12 NOTE — Progress Notes (Signed)
 Complete physical exam  Patient: Gerald [name]   DOB: [birth year]   68 y.o. Male  MRN: [id number]  Subjective:    Chief Complaint  Patient presents with   Annual Exam    Had Labs done Monday-    Gerald [name] is a 68 y.o. male who presents today for a complete physical exam.  He reports consuming a general diet. Gym/ health club routine includes treadmill. Golf also pickleball He generally feels well. He reports sleeping well. Discussed the use of AI scribe software for clinical note transcription with the patient, who gave verbal consent to proceed.  History of Present Illness   Gerald [name] is a 68 year old male who presents for an annual physical exam.  He has a history of atrial fibrillation, discovered during a follow-up for cramps last year. He underwent three cardioversions, one of which was temporarily successful, followed by an ablation in November of last year. Since then, he has not experienced any symptoms of atrial fibrillation and remains in normal sinus rhythm. He continues to take metoprolol  50 mg once daily without side effects.  His cholesterol levels are generally within normal range, with an LDL of 59 mg/dL. He has been on rosuvastatin , which was recently renewed, and he continues to take it without any side effects. His triglycerides have been borderline for many years. He has a history of lumbar surgery and aortic atherosclerosis.  He mentions a past diagnosis of diabetes with an A1c of 6.9 in October 2024, but recent testing shows an A1c of 6.0. No symptoms of diabetes are reported.  He has a history of reflux, which is well-controlled with Nexium  taken every other day. He experienced a recent episode after consuming [place] lasagna, but otherwise, he has not had significant issues.  He is currently using testosterone  gel, applying one pump daily, which maintains his levels in the 400-500 range. He occasionally uses two pumps depending on how he feels,  but generally sticks to one pump.  He has a history of trigger finger, which was treated with an injection. The condition has improved, although he still experiences stiffness in the morning. Previously, it was severe enough to prevent him from gripping a golf club.  He had a colonoscopy in 2021, which was clean, following a previous finding of tubular adenoma in 2017. He is on a five-year follow-up plan.  He reports a past cataract surgery on his right eye, with plans to eventually address the left eye. He experienced complications with the first surgery, which delayed the second procedure.   He was given Cialis  in the past but does not use it but does note difficulty with getting and maintaining erections and would like some more Cialis . He has had back issues and has had previous surgery.  He is doing quite nicely after this and is playing golf regularly without any difficulties. Most recent fall risk assessment:    08/12/2024    1:44 PM  Fall Risk   Falls in the past year? 0  Number falls in past yr: 0  Injury with Fall? 0  Risk for fall due to : No Fall Risks  Follow up Falls evaluation completed     Most recent depression screenings:    08/12/2024    1:44 PM 08/14/2023   10:14 AM  PHQ 2/9 Scores  PHQ - 2 Score 0 0    Vision:Within last year [facility] Dentist-Katherine Smith 2x per year Ortho- Dr. Alyse Trigger finger  [facility] Month ago Colonoscopy 2021       Immunization History  Administered Date(s) Administered   Fluad Quad(high Dose 65+) 07/23/2021, 08/05/2022   Fluad Trivalent(High Dose 65+) 08/14/2023   INFLUENZA, HIGH DOSE SEASONAL PF 08/12/2024   Influenza, Quadrivalent, Recombinant, Inj, Pf 08/05/2019   Influenza,inj,Quad PF,6+ Mos 09/11/2018, 07/17/2020   Influenza-Unspecified 09/11/2018   PFIZER(Purple Top)SARS-COV-2 Vaccination 01/15/2020, 02/07/2020, 10/26/2020   PNEUMOCOCCAL CONJUGATE-20 08/14/2023   PPD Test 03/06/2002   Tdap 05/25/2009,  06/21/2019   Zoster Recombinant(Shingrix ) 07/08/2017, 09/23/2017   Zoster, Live 07/09/2016    Health Maintenance  Topic Date Due   FOOT EXAM  Never done   Diabetic kidney evaluation - Urine ACR  Never done   HEMOGLOBIN A1C  02/12/2024   Medicare Annual Wellness (AWV)  08/13/2024   COVID-19 Vaccine (4 - 2025-26 season) 08/28/2024 (Originally 07/05/2024)   Colonoscopy  01/31/2025   OPHTHALMOLOGY EXAM  02/01/2025   Diabetic kidney evaluation - eGFR measurement  08/09/2025   DTaP/Tdap/Td (3 - Td or Tdap) 06/20/2029   Pneumococcal Vaccine: 50+ Years  Completed   Influenza Vaccine  Completed   Hepatitis C Screening  Completed   Zoster Vaccines- Shingrix   Completed   Meningococcal B Vaccine  Aged Out    Patient Care Team: Joyce Norleen BROCKS, MD as PCP - General (Family Medicine) Kennyth Chew, MD as PCP - Electrophysiology (Cardiology)   Outpatient Medications Prior to Visit  Medication Sig   esomeprazole  (NEXIUM ) 40 MG capsule Take 40 mg by mouth daily.   metoprolol  succinate (TOPROL -XL) 50 MG 24 hr tablet Take 1 tablet (50 mg total) by mouth daily. Take with or immediately following a meal.   Multiple Vitamins-Minerals (MULTIVITAMIN WITH MINERALS) tablet Take 1 tablet by mouth daily. For Men 50+   [DISCONTINUED] rosuvastatin  (CRESTOR ) 20 MG tablet TAKE 1 TABLET BY MOUTH DAILY   [DISCONTINUED] Testosterone  12.5 MG/ACT (1%) GEL Place 2 Applications onto the skin daily. Only comes in 150g -not 75g (this would be 2 month supply)   No facility-administered medications prior to visit.    ROS  Family and social history as well as health maintenance and immunizations was reviewed.     Objective:    BP 112/70   Pulse 96   Ht 6' 1 (1.854 m)   Wt 207 lb (93.9 kg)   SpO2 96%   BMI 27.31 kg/m    Physical Exam  Alert and in no distress. Tympanic membranes and canals are normal. Pharyngeal area is normal. Neck is supple without adenopathy or thyromegaly. Cardiac exam shows a regular  sinus rhythm without murmurs or gallops. Lungs are clear to auscultation. Hemoglobin A1c is 6.0     Assessment & Plan:    Discussed health benefits of physical activity, and encouraged him to engage in regular exercise appropriate for his age and condition. Assessment and Plan    Obesity Obesity with a goal to lose 15 pounds. Current weight management challenges include dietary habits such as nightly ice cream consumption. - Encourage dietary modifications and increased physical activity. - Focus on reducing ice cream consumption to aid weight loss.  Type 2 diabetes mellitus, well controlled Type 2 diabetes mellitus with current A1c of 6.0, indicating well-controlled diabetes. - Monitor A1c levels every 6 months. - Encourage dietary modifications and exercise to maintain glycemic control.  Status post atrial fibrillation ablation, in sinus rhythm Status post atrial fibrillation ablation with current normal sinus rhythm. Continues on metoprolol  50 mg daily. - Continue metoprolol  50 mg daily. -  Monitor heart rhythm regularly.  Hyperlipidemia on statin therapy Hyperlipidemia managed with rosuvastatin . LDL cholesterol is well-controlled at 59. - Continue rosuvastatin  therapy. - Renew rosuvastatin  prescription.  Aortic atherosclerosis Aortic atherosclerosis present, contributing to the rationale for continued statin therapy. - Continue rosuvastatin  therapy for cardiovascular protection.  Testosterone  deficiency on replacement therapy Testosterone  levels currently at 437, within normal range. Continues on testosterone  replacement therapy. - Continue testosterone  replacement therapy with one pump daily. - Renew testosterone  prescription.  Erectile dysfunction Erectile dysfunction discussed with interest in trying Cialis . - Prescribe Cialis , 10 tablets, to be used as needed.  Gastroesophageal reflux disease (GERD) GERD managed with Nexium  taken every other day. Occasional reflux  noted. - Continue Nexium  every other day.  Trigger finger, right hand, post-injection Trigger finger in the right hand treated with injection. Improvement noted, though some stiffness persists. - Follow up with Dr. Alyse as scheduled.  Lumbar spine disorder, post-surgical Lumbar spine disorder post-surgery with occasional soreness. - Monitor for any changes in symptoms.  Colonic adenomatous polyps, post-polypectomy, under surveillance Colonic adenomatous polyps with last colonoscopy in 2021 showing no polyps. On a 5-year surveillance plan. - Continue surveillance with colonoscopy scheduled for 2026.  Cataract, right eye, post-surgical; left eye, not yet operated Right eye cataract surgery completed with some complications in healing. Left eye cataract not yet operated. - Monitor vision and consider scheduling left eye cataract surgery when appropriate.      Recheck here in 6 months.    Norleen Jobs, MD

## 2024-08-16 ENCOUNTER — Ambulatory Visit

## 2024-08-16 DIAGNOSIS — M65342 Trigger finger, left ring finger: Secondary | ICD-10-CM | POA: Diagnosis not present

## 2024-08-16 DIAGNOSIS — M720 Palmar fascial fibromatosis [Dupuytren]: Secondary | ICD-10-CM | POA: Diagnosis not present

## 2024-11-01 ENCOUNTER — Other Ambulatory Visit: Payer: Self-pay | Admitting: Family Medicine

## 2024-11-01 ENCOUNTER — Other Ambulatory Visit: Payer: Self-pay | Admitting: Cardiology

## 2025-02-10 ENCOUNTER — Ambulatory Visit: Payer: Self-pay | Admitting: Family Medicine
